# Patient Record
Sex: Female | Born: 1955
Health system: Southern US, Community
[De-identification: ages and names within clinical notes are randomized; demographics above are authoritative.]

## PROBLEM LIST (undated history)

## (undated) DIAGNOSIS — R251 Tremor, unspecified: Secondary | ICD-10-CM

## (undated) DIAGNOSIS — R0902 Hypoxemia: Secondary | ICD-10-CM

## (undated) DIAGNOSIS — J189 Pneumonia, unspecified organism: Secondary | ICD-10-CM

## (undated) DIAGNOSIS — R062 Wheezing: Secondary | ICD-10-CM

## (undated) DIAGNOSIS — R011 Cardiac murmur, unspecified: Secondary | ICD-10-CM

## (undated) DIAGNOSIS — F419 Anxiety disorder, unspecified: Secondary | ICD-10-CM

## (undated) DIAGNOSIS — J449 Chronic obstructive pulmonary disease, unspecified: Secondary | ICD-10-CM

## (undated) DIAGNOSIS — R0601 Orthopnea: Secondary | ICD-10-CM

## (undated) DIAGNOSIS — F32A Depression, unspecified: Secondary | ICD-10-CM

## (undated) DIAGNOSIS — E785 Hyperlipidemia, unspecified: Secondary | ICD-10-CM

## (undated) DIAGNOSIS — R51 Headache: Secondary | ICD-10-CM

## (undated) DIAGNOSIS — G4730 Sleep apnea, unspecified: Secondary | ICD-10-CM

## (undated) DIAGNOSIS — R519 Headache, unspecified: Secondary | ICD-10-CM

## (undated) DIAGNOSIS — R05 Cough: Secondary | ICD-10-CM

## (undated) DIAGNOSIS — R609 Edema, unspecified: Secondary | ICD-10-CM

## (undated) DIAGNOSIS — R059 Cough, unspecified: Secondary | ICD-10-CM

## (undated) DIAGNOSIS — I10 Essential (primary) hypertension: Secondary | ICD-10-CM

## (undated) DIAGNOSIS — R600 Localized edema: Secondary | ICD-10-CM

## (undated) DIAGNOSIS — J989 Respiratory disorder, unspecified: Secondary | ICD-10-CM

## (undated) DIAGNOSIS — K219 Gastro-esophageal reflux disease without esophagitis: Secondary | ICD-10-CM

## (undated) DIAGNOSIS — F329 Major depressive disorder, single episode, unspecified: Secondary | ICD-10-CM

## (undated) DIAGNOSIS — Z9109 Other allergy status, other than to drugs and biological substances: Secondary | ICD-10-CM

## (undated) DIAGNOSIS — J40 Bronchitis, not specified as acute or chronic: Secondary | ICD-10-CM

## (undated) DIAGNOSIS — Z720 Tobacco use: Secondary | ICD-10-CM

## (undated) HISTORY — DX: Depression, unspecified: F32.A

## (undated) HISTORY — DX: Essential (primary) hypertension: I10

## (undated) HISTORY — PX: EYE SURGERY: SHX253

## (undated) HISTORY — DX: Tobacco use: Z72.0

## (undated) HISTORY — DX: Chronic obstructive pulmonary disease, unspecified: J44.9

## (undated) HISTORY — PX: TUBAL LIGATION: SHX77

## (undated) HISTORY — DX: Anxiety disorder, unspecified: F41.9

## (undated) HISTORY — DX: Major depressive disorder, single episode, unspecified: F32.9

## (undated) HISTORY — DX: Hyperlipidemia, unspecified: E78.5

---

## 2005-05-03 HISTORY — PX: FRACTURE SURGERY: SHX138

## 2005-06-17 ENCOUNTER — Ambulatory Visit: Payer: Self-pay | Admitting: Otolaryngology

## 2007-08-17 ENCOUNTER — Ambulatory Visit: Payer: Self-pay | Admitting: Family Medicine

## 2007-08-28 ENCOUNTER — Ambulatory Visit: Payer: Self-pay | Admitting: Internal Medicine

## 2007-09-05 ENCOUNTER — Ambulatory Visit: Payer: Self-pay | Admitting: Internal Medicine

## 2010-04-07 ENCOUNTER — Ambulatory Visit: Payer: Self-pay | Admitting: Internal Medicine

## 2010-12-11 ENCOUNTER — Encounter: Payer: Self-pay | Admitting: Internal Medicine

## 2011-01-07 ENCOUNTER — Telehealth: Payer: Self-pay | Admitting: Internal Medicine

## 2011-01-07 MED ORDER — AZITHROMYCIN 500 MG PO TABS: 500.0000 mg | ORAL_TABLET | Freq: Every day | ORAL | Status: AC

## 2011-01-07 MED ORDER — HYDROCODONE-ACETAMINOPHEN 5-500 MG PO TABS: 1.0000 | ORAL_TABLET | Freq: Four times a day (QID) | ORAL | Status: DC | PRN

## 2011-01-07 NOTE — Telephone Encounter (Signed)
Yes, you can call her in azithromycin 500 mg one tablet daily for 7 days #7 no refills.  alsoc can call her in vicodin 5/500 one tablet every 6 hours as needed for cough  #40 no refills

## 2011-01-07 NOTE — Telephone Encounter (Signed)
Patient called and wanted to know if you could call her in an antibiotic for this weekend because she is in [place] because of her sister's death and then she stated she will make an appt with you next week.  She stated she is congested and has a terrible cough that is causing her to lose her voice, she does not report running a fever.   She stated she started a prednisone refill you had given her in the past but she only has one day left and it has not helped her, she has also been taking mucinex.  She just didn't want this to turn into pneumonia like it has in the past.  Please advise.

## 2011-01-07 NOTE — Telephone Encounter (Signed)
Addended by: Darletta Moll A on: 01/07/2011 05:28 PM   Modules accepted: Orders

## 2011-01-07 NOTE — Telephone Encounter (Signed)
Both Rxs have been called in

## 2011-01-13 ENCOUNTER — Ambulatory Visit (INDEPENDENT_AMBULATORY_CARE_PROVIDER_SITE_OTHER): Payer: BC Managed Care – PPO | Admitting: Internal Medicine

## 2011-01-13 ENCOUNTER — Encounter: Payer: Self-pay | Admitting: Internal Medicine

## 2011-01-13 VITALS — BP 128/92 | HR 81 | Temp 98.2°F | Resp 16 | Ht 65.0 in | Wt 141.5 lb

## 2011-01-13 DIAGNOSIS — Z720 Tobacco use: Secondary | ICD-10-CM

## 2011-01-13 DIAGNOSIS — J449 Chronic obstructive pulmonary disease, unspecified: Secondary | ICD-10-CM

## 2011-01-13 DIAGNOSIS — F411 Generalized anxiety disorder: Secondary | ICD-10-CM

## 2011-01-13 DIAGNOSIS — F17200 Nicotine dependence, unspecified, uncomplicated: Secondary | ICD-10-CM

## 2011-01-13 DIAGNOSIS — R05 Cough: Secondary | ICD-10-CM

## 2011-01-13 DIAGNOSIS — R059 Cough, unspecified: Secondary | ICD-10-CM

## 2011-01-13 DIAGNOSIS — F419 Anxiety disorder, unspecified: Secondary | ICD-10-CM

## 2011-01-13 MED ORDER — METHYLPREDNISOLONE ACETATE 40 MG/ML IJ SUSP
40.0000 mg | Freq: Once | INTRAMUSCULAR | Status: AC
  Administered 2011-01-13: 40 mg via INTRAMUSCULAR

## 2011-01-13 MED ORDER — ALPRAZOLAM 0.5 MG PO TABS: 0.5000 mg | ORAL_TABLET | Freq: Every day | ORAL | Status: DC | PRN

## 2011-01-16 ENCOUNTER — Encounter: Payer: Self-pay | Admitting: Internal Medicine

## 2011-01-16 DIAGNOSIS — F329 Major depressive disorder, single episode, unspecified: Secondary | ICD-10-CM | POA: Insufficient documentation

## 2011-01-16 DIAGNOSIS — J432 Centrilobular emphysema: Secondary | ICD-10-CM | POA: Insufficient documentation

## 2011-01-16 DIAGNOSIS — E785 Hyperlipidemia, unspecified: Secondary | ICD-10-CM | POA: Insufficient documentation

## 2011-01-16 DIAGNOSIS — Z87891 Personal history of nicotine dependence: Secondary | ICD-10-CM | POA: Insufficient documentation

## 2011-01-16 DIAGNOSIS — F32A Depression, unspecified: Secondary | ICD-10-CM | POA: Insufficient documentation

## 2011-01-16 DIAGNOSIS — F419 Anxiety disorder, unspecified: Secondary | ICD-10-CM | POA: Insufficient documentation

## 2011-01-16 NOTE — Assessment & Plan Note (Signed)
Reviewed her tobacco abuse and reinforced that chronic cough is not going to resolve with tobacco cessation.  5 minutes was spent in tobacco cessation counselling.

## 2011-01-16 NOTE — Assessment & Plan Note (Signed)
Her pulmonary exam was normal and her 02 sats were fine. Will rx vicodin for nighttime cough, and obtain CXR if no improvement in 2-3 weeks

## 2011-01-16 NOTE — Progress Notes (Signed)
  Subjective:    Patient ID: Diane [name], female    DOB: 1955-06-07, 55 y.o.   MRN: [id number]  HPI 55 yo woman with history of chronic depression and generalized anxiety , tobacco abuse and COPD presents with persistent cough and increasd anxiety following recnet deaths in her family.  She had a COPD exacerbation while attending her father in law's funeral in [place] earlier in the month and was treated empirically with antibiotics and prednisone along with her usual bronchodilator therapy.  Her cough has persisted but it is nonproductive.  Review of Systems  Constitutional: Positive for fatigue. Negative for appetite change.  HENT: Positive for postnasal drip. Negative for ear pain, sneezing, drooling, neck pain and sinus pressure.   Eyes: Negative for discharge and redness.  Respiratory: Positive for cough, chest tightness and shortness of breath.   Cardiovascular: Negative for chest pain, palpitations and leg swelling.  Gastrointestinal: Negative.   Genitourinary: Negative.   Musculoskeletal: Negative.   Skin: Negative.   Neurological: Positive for headaches.  Psychiatric/Behavioral: Positive for sleep disturbance, dysphoric mood and decreased concentration. The patient is nervous/anxious.        Objective:   Physical Exam  Constitutional: She is oriented to person, place, and time. She appears well-developed and well-nourished.  HENT:  Mouth/Throat: Oropharynx is clear and moist.  Eyes: EOM are normal. Pupils are equal, round, and reactive to light. No scleral icterus.  Neck: Normal range of motion. Neck supple. No JVD present. No thyromegaly present.  Cardiovascular: Normal rate, regular rhythm, normal heart sounds and intact distal pulses.   Pulmonary/Chest: Effort normal and breath sounds normal.  Abdominal: Soft. Bowel sounds are normal. She exhibits no mass. There is no tenderness.  Musculoskeletal: Normal range of motion. She exhibits no edema.  Lymphadenopathy:    She  has no cervical adenopathy.  Neurological: She is alert and oriented to person, place, and time.  Skin: Skin is warm and dry.  Psychiatric: Her speech is normal and behavior is normal. Judgment and thought content normal. Her mood appears anxious. Cognition and memory are normal. She exhibits a depressed mood.          Assessment & Plan:

## 2011-01-16 NOTE — Assessment & Plan Note (Addendum)
She is having a difficult time due to several recent deaths in her family and her inability to take off work to comfort her husband who lost his father to Cancer.  Trial of alprazolam to use short term for relief of anxiety and insomnia.  Increase dose of  celexa. Return in one month

## 2011-02-24 ENCOUNTER — Other Ambulatory Visit: Payer: Self-pay | Admitting: Internal Medicine

## 2011-04-13 ENCOUNTER — Telehealth: Payer: Self-pay | Admitting: Internal Medicine

## 2011-04-13 NOTE — Telephone Encounter (Signed)
[phone] home can leave message Pt called wanted to be seen  Patient has sore throat congestion in chest fever (on and off) cough up phlem that is green  [facility] [address]

## 2011-04-13 NOTE — Telephone Encounter (Signed)
1:15 Tomorrow  Please put her in

## 2011-04-13 NOTE — Telephone Encounter (Signed)
Patient has been scheduled for tomorrow.  She is aware of the appt.

## 2011-04-13 NOTE — Telephone Encounter (Signed)
I called patient she stated she has a productive cough, sometimes she has problems breathing, body aches, and sore throat.  She stated this started over the weekend and she thinks she needs prednisone.  Please advise.

## 2011-04-14 ENCOUNTER — Ambulatory Visit (INDEPENDENT_AMBULATORY_CARE_PROVIDER_SITE_OTHER): Payer: BC Managed Care – PPO | Admitting: Internal Medicine

## 2011-04-14 ENCOUNTER — Encounter: Payer: Self-pay | Admitting: Internal Medicine

## 2011-04-14 DIAGNOSIS — J441 Chronic obstructive pulmonary disease with (acute) exacerbation: Secondary | ICD-10-CM | POA: Insufficient documentation

## 2011-04-14 MED ORDER — NICOTINE 7 MG/24HR TD PT24: 1.0000 | MEDICATED_PATCH | TRANSDERMAL | Status: AC

## 2011-04-14 MED ORDER — HYDROCOD POLST-CHLORPHEN POLST 10-8 MG/5ML PO LQCR: 10.0000 mL | Freq: Every evening | ORAL | Status: DC | PRN

## 2011-04-14 MED ORDER — DOXYCYCLINE HYCLATE 100 MG PO TABS: 100.0000 mg | ORAL_TABLET | Freq: Two times a day (BID) | ORAL | Status: AC

## 2011-04-14 MED ORDER — PREDNISONE (PAK) 10 MG PO TABS: ORAL_TABLET | ORAL | Status: AC

## 2011-04-14 NOTE — Progress Notes (Signed)
Subjective:    Patient ID: Diane [name], female    DOB: [birth year], 55 y.o.   MRN: [id number]  HPI  Diane [name] is a 55 yo smoker who presents with 1.5 wk of productive cough,  subjective fevers and chills.  She denies severe myalgias and any sick contacts. She has had her influenza vaccine.  She denies chest pain bur does report dyspnea and wheezing.  Has been using Spiriva and Symbicort and her rescue inhaler three times daily.  Past Medical History  Diagnosis Date  . COPD (chronic obstructive pulmonary disease)   . Hyperlipidemia   . Tobacco abuse   . Depression   . Anxiety    Current Outpatient Prescriptions on File Prior to Visit  Medication Sig Dispense Refill  . albuterol (VENTOLIN HFA) 108 (90 BASE) MCG/ACT inhaler Inhale 2 puffs into the lungs 4 (four) times daily as needed.        . ALPRAZolam (XANAX) 0.5 MG tablet Take 1 tablet (0.5 mg total) by mouth daily as needed for sleep or anxiety.  30 tablet  5  . budesonide-formoterol (SYMBICORT) 160-4.5 MCG/ACT inhaler Inhale 2 puffs into the lungs 2 (two) times daily.        . citalopram (CELEXA) 20 MG tablet Take 20 mg by mouth daily.        . eszopiclone (LUNESTA) 2 MG TABS Take 2 mg by mouth at bedtime. Take immediately before bedtime       . hydrochlorothiazide 25 MG tablet Take 25 mg by mouth daily.        . Red Yeast Rice 600 MG CAPS Take by mouth daily.        Marland Kitchen tiotropium (SPIRIVA HANDIHALER) 18 MCG inhalation capsule Place 1 capsule (18 mcg total) into inhaler and inhale daily.  30 each  6     Review of Systems  Constitutional: Positive for fever and chills. Negative for unexpected weight change.  HENT: Positive for congestion, rhinorrhea and sinus pressure. Negative for hearing loss, ear pain, nosebleeds, sore throat, facial swelling, sneezing, mouth sores, trouble swallowing, neck pain, neck stiffness, voice change, postnasal drip, tinnitus and ear discharge.   Eyes: Negative for pain, discharge, redness and visual  disturbance.  Respiratory: Positive for cough, shortness of breath and wheezing. Negative for chest tightness and stridor.   Cardiovascular: Negative for chest pain, palpitations and leg swelling.  Musculoskeletal: Negative for myalgias and arthralgias.  Skin: Negative for color change and rash.  Neurological: Positive for headaches. Negative for dizziness, weakness and light-headedness.  Hematological: Negative for adenopathy.       Objective:   Physical Exam  Constitutional: She is oriented to person, place, and time. She appears well-developed and well-nourished.  HENT:  Mouth/Throat: Oropharynx is clear and moist.  Eyes: EOM are normal. Pupils are equal, round, and reactive to light. No scleral icterus.  Neck: Normal range of motion. Neck supple. No JVD present. No thyromegaly present.  Cardiovascular: Normal rate, regular rhythm, normal heart sounds and intact distal pulses.   Pulmonary/Chest: Effort normal. She has wheezes.  Abdominal: Soft. Bowel sounds are normal. She exhibits no mass. There is no tenderness.  Musculoskeletal: Normal range of motion. She exhibits no edema.  Lymphadenopathy:    She has no cervical adenopathy.  Neurological: She is alert and oriented to person, place, and time.  Skin: Skin is warm and dry.  Psychiatric: She has a normal mood and affect.          Assessment & Plan:  COPD exacerbation Precipitated by viral illness .  Will treat with doxycline, steroid taper and cough suppressants.    Updated Medication List Outpatient Encounter Prescriptions as of 04/14/2011  Medication Sig Dispense Refill  . albuterol (VENTOLIN HFA) 108 (90 BASE) MCG/ACT inhaler Inhale 2 puffs into the lungs 4 (four) times daily as needed.        . ALPRAZolam (XANAX) 0.5 MG tablet Take 1 tablet (0.5 mg total) by mouth daily as needed for sleep or anxiety.  30 tablet  5  . budesonide-formoterol (SYMBICORT) 160-4.5 MCG/ACT inhaler Inhale 2 puffs into the lungs 2 (two)  times daily.        . citalopram (CELEXA) 20 MG tablet Take 20 mg by mouth daily.        . eszopiclone (LUNESTA) 2 MG TABS Take 2 mg by mouth at bedtime. Take immediately before bedtime       . hydrochlorothiazide 25 MG tablet Take 25 mg by mouth daily.        . Red Yeast Rice 600 MG CAPS Take by mouth daily.        Marland Kitchen tiotropium (SPIRIVA HANDIHALER) 18 MCG inhalation capsule Place 1 capsule (18 mcg total) into inhaler and inhale daily.  30 each  6  . chlorpheniramine-HYDROcodone (TUSSIONEX PENNKINETIC ER) 10-8 MG/5ML LQCR Take 10 mLs by mouth at bedtime as needed.  150 mL  0  . doxycycline (VIBRA-TABS) 100 MG tablet Take 1 tablet (100 mg total) by mouth 2 (two) times daily.  14 tablet  0  . nicotine (NICODERM CQ - DOSED IN MG/24 HR) 7 mg/24hr patch Place 1 patch onto the skin daily.  30 patch  0  . predniSONE (STERAPRED UNI-PAK) 10 MG tablet 6 tablets on day 1,  Decrease by 1 tablet daily until gone.  21 tablet  0  . DISCONTD: HYDROcodone-acetaminophen (VICODIN) 5-500 MG per tablet Take 1 tablet by mouth every 6 (six) hours as needed (cough).  40 tablet  0

## 2011-04-14 NOTE — Assessment & Plan Note (Signed)
Precipitated by viral illness .  Will treat with doxycline, steroid taper and cough suppressants.

## 2011-04-15 ENCOUNTER — Telehealth: Payer: Self-pay | Admitting: Internal Medicine

## 2011-04-15 NOTE — Telephone Encounter (Signed)
this is why i sked her yesterday if she needed one.a dn she said no.  I am out of the office this afternoon m it will have to wati utnil tomrrow unless you can generate one

## 2011-04-15 NOTE — Telephone Encounter (Signed)
[phone]  Pt called stated she was in to see dr Darrick Huntsman yesterday and she stay home again today and wanted to know if she could get a note for yesterday and today. Please advise pt

## 2011-04-15 NOTE — Telephone Encounter (Signed)
Patient called back, she says that she needs this note to be for yesterday and today because she called in today. She wants this faxed to her work. [phone]

## 2011-06-18 ENCOUNTER — Ambulatory Visit: Payer: Self-pay | Admitting: Internal Medicine

## 2011-06-21 ENCOUNTER — Telehealth: Payer: Self-pay | Admitting: Internal Medicine

## 2011-06-21 NOTE — Telephone Encounter (Signed)
You can call her in tessalon 200 mg every 8 hours prn cough  #30 if she needs something

## 2011-06-21 NOTE — Telephone Encounter (Signed)
[facility] Triage Call Report Triage Record Num: [id number] Operator: Lesli Albee Patient Name: Diane [name] Call Date & Time: 06/21/2011 11:02:07AM Patient Phone: [phone] PCP: Patient Gender: Female PCP Fax : Patient DOB: 1955/09/28 Practice Name: [facility] Reason for Call: Caller: Diane [name]; PCP: Duncan Dull; CB#: [phone]; ; ; Call regarding Cough/Congestion; Pt is calling to say that she has been to the [facility] on Friday. Pt was dx with bronchitis. Pt had a CXR which was negative for pneumonia. She was prescribed Doxycycline 2 capsules BID . Pt started it Friday evening. She is calling to say that even though her fever has stopped and she has stopped wheezing with tx; she continues a dry cough. Calling for advice about that. Pt takes Spireva QD; Symbacort BID; and she has Albuterol as a rescue inhaler that she has only used 2x. Pt is not wheezing or in any resp distress. RN advised she begin to use her Albuterol q4h and advised other home care measures for her cough. Pt will call back if no improvement. Protocol(s) Used: Upper Respiratory Infection (URI) Recommended Outcome per Protocol: Provide Home/Self Care Reason for Outcome: All other situations Care Advice: ~ Use a cool mist humidifier to moisten air. Be sure to clean according to manufacturer's instructions. ~ Consider use of a saline nasal spray per package directions to help relieve nasal congestion. ~ HEALTH PROMOTION / MAINTENANCE ~ SYMPTOM / CONDITION MANAGEMENT ~ INFECTION CONTROL Consider nonprescription decongestant (Sudafed, Drixoral) for relief of symptoms after checking with a provider, especially if there is a history of hypertension, hyperthyroidism, heart disease, diabetes, glaucoma, urinary retention caused by prostatic hypertrophy. ~ During pregnancy or when breastfeeding, do not take nonprescription, complementary/alternative medication(s) without the approval of  provider ~ Most adults need to drink 6-10 eight-ounce glasses (1.2-2.0 liters) of fluids per day unless previously told to limit fluid intake for other medical reasons. Limit fluids that contain caffeine, sugar or alcohol. Urine will be a very light yellow color when you drink enough fluids. ~ Analgesic/Antipyretic Advice - Acetaminophen: Consider acetaminophen as directed on label or by pharmacist/provider for pain or fever PRECAUTIONS: - Use if there is no history of liver disease, alcoholism, or intake of three or more alcohol drinks per day - Only if approved by provider during pregnancy or when breastfeeding - During pregnancy, acetaminophen should not be taken more than 3 consecutive days without telling provider - Do not exceed recommended dose or frequency ~ Analgesic/Antipyretic Advice - NSAIDs: Consider aspirin, ibuprofen, naproxen or ketoprofen for pain or fever as directed on label or by pharmacist/provider. PRECAUTIONS: - If over 63 years of age, should not take longer than 1 week without consulting provider. EXCEPTIONS: - Should not be used if taking blood thinners or have bleeding problems. - Do not use if have history of sensitivity/allergy to any of these medications; or history of cardiovascular, ulcer, ~ 06/21/2011 11:12:27AM Page 1 of 2 [id number] [facility] Triage Call Report Patient Name: Diane [name] continuation page/s kidney, liver disease or diabetes unless approved by provider. - Do not exceed recommended dose or frequency. Respiratory Hygiene: - Cover the nose/mouth tightly with a tissue when coughing or sneezing. - Use tissue 1 time and discard in the nearest waste receptacle. - Wash hands with soap and water or alcohol-based hand rub after coming into contact with respiratory secretions and contaminated objects/materials. - Alternatively when no tissue is available, cough into the bend of the elbow. .Avoid touching your eyes, nose or  mouth. ~ 06/21/2011 11:12:27AM  Page 2 of 2 [email or web address]

## 2011-06-22 MED ORDER — BENZONATATE 200 MG PO CAPS: ORAL_CAPSULE | ORAL | Status: DC

## 2011-06-22 NOTE — Telephone Encounter (Signed)
Left message on machine asking pt to call back. 

## 2011-06-22 NOTE — Telephone Encounter (Signed)
Advised pt, she does want the tessalon, tessalon sent to pharmacy.

## 2011-08-12 ENCOUNTER — Ambulatory Visit (INDEPENDENT_AMBULATORY_CARE_PROVIDER_SITE_OTHER): Payer: BC Managed Care – PPO | Admitting: Internal Medicine

## 2011-08-12 ENCOUNTER — Encounter: Payer: Self-pay | Admitting: Internal Medicine

## 2011-08-12 ENCOUNTER — Other Ambulatory Visit (HOSPITAL_COMMUNITY)
Admission: RE | Admit: 2011-08-12 | Discharge: 2011-08-12 | Disposition: A | Discharge status: Home / Self Care | Payer: BC Managed Care – PPO | Source: Ambulatory Visit | Attending: Internal Medicine | Admitting: Internal Medicine

## 2011-08-12 VITALS — BP 152/80 | HR 70 | Temp 98.5°F | Resp 18 | Wt 142.8 lb

## 2011-08-12 DIAGNOSIS — R5381 Other malaise: Secondary | ICD-10-CM

## 2011-08-12 DIAGNOSIS — R5383 Other fatigue: Secondary | ICD-10-CM

## 2011-08-12 DIAGNOSIS — Z01419 Encounter for gynecological examination (general) (routine) without abnormal findings: Secondary | ICD-10-CM | POA: Insufficient documentation

## 2011-08-12 DIAGNOSIS — F17200 Nicotine dependence, unspecified, uncomplicated: Secondary | ICD-10-CM

## 2011-08-12 DIAGNOSIS — Z0000 Encounter for general adult medical examination without abnormal findings: Secondary | ICD-10-CM

## 2011-08-12 DIAGNOSIS — Z720 Tobacco use: Secondary | ICD-10-CM

## 2011-08-12 DIAGNOSIS — J4489 Other specified chronic obstructive pulmonary disease: Secondary | ICD-10-CM

## 2011-08-12 DIAGNOSIS — T148XXA Other injury of unspecified body region, initial encounter: Secondary | ICD-10-CM

## 2011-08-12 DIAGNOSIS — Z1159 Encounter for screening for other viral diseases: Secondary | ICD-10-CM | POA: Insufficient documentation

## 2011-08-12 DIAGNOSIS — J449 Chronic obstructive pulmonary disease, unspecified: Secondary | ICD-10-CM

## 2011-08-12 DIAGNOSIS — I10 Essential (primary) hypertension: Secondary | ICD-10-CM

## 2011-08-12 DIAGNOSIS — Z1239 Encounter for other screening for malignant neoplasm of breast: Secondary | ICD-10-CM

## 2011-08-12 DIAGNOSIS — Z124 Encounter for screening for malignant neoplasm of cervix: Secondary | ICD-10-CM

## 2011-08-12 DIAGNOSIS — E785 Hyperlipidemia, unspecified: Secondary | ICD-10-CM

## 2011-08-12 MED ORDER — CITALOPRAM HYDROBROMIDE 20 MG PO TABS: 20.0000 mg | ORAL_TABLET | Freq: Every day | ORAL | Status: DC
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
## 2011-08-13 ENCOUNTER — Other Ambulatory Visit (INDEPENDENT_AMBULATORY_CARE_PROVIDER_SITE_OTHER): Payer: BC Managed Care – PPO | Admitting: *Deleted

## 2011-08-13 DIAGNOSIS — R5381 Other malaise: Secondary | ICD-10-CM

## 2011-08-13 DIAGNOSIS — E785 Hyperlipidemia, unspecified: Secondary | ICD-10-CM

## 2011-08-13 DIAGNOSIS — R5383 Other fatigue: Secondary | ICD-10-CM

## 2011-08-13 DIAGNOSIS — T148XXA Other injury of unspecified body region, initial encounter: Secondary | ICD-10-CM

## 2011-08-13 LAB — CBC WITH DIFFERENTIAL/PLATELET
Basophils Absolute: 0 10*3/uL (ref 0.0–0.1)
Basophils Relative: 0.2 % (ref 0.0–3.0)
Eosinophils Absolute: 0.1 10*3/uL (ref 0.0–0.7)
Lymphocytes Relative: 30.3 % (ref 12.0–46.0)
MCHC: 33.5 g/dL (ref 30.0–36.0)
Monocytes Relative: 9.3 % (ref 3.0–12.0)
Neutrophils Relative %: 57.5 % (ref 43.0–77.0)
RBC: 4.53 Mil/uL (ref 3.87–5.11)
RDW: 13.5 % (ref 11.5–14.6)

## 2011-08-13 LAB — TSH: TSH: 0.91 u[IU]/mL (ref 0.35–5.50)

## 2011-08-13 LAB — LIPID PANEL
Cholesterol: 217 mg/dL — ABNORMAL HIGH (ref 0–200)
Total CHOL/HDL Ratio: 4
Triglycerides: 73 mg/dL (ref 0.0–149.0)

## 2011-08-14 LAB — COMPLETE METABOLIC PANEL WITH GFR
ALT: 16 U/L (ref 0–35)
CO2: 28 mEq/L (ref 19–32)
Calcium: 9.5 mg/dL (ref 8.4–10.5)
Chloride: 103 mEq/L (ref 96–112)
Creat: 0.81 mg/dL (ref 0.50–1.10)
GFR, Est African American: >89 mL/min
GFR, Est Non African American: 83 mL/min
Glucose, Bld: 80 mg/dL (ref 70–99)
Total Bilirubin: 0.5 mg/dL (ref 0.3–1.2)

## 2011-08-15 ENCOUNTER — Encounter: Payer: Self-pay | Admitting: Internal Medicine

## 2011-08-15 DIAGNOSIS — I10 Essential (primary) hypertension: Secondary | ICD-10-CM | POA: Insufficient documentation

## 2011-08-15 DIAGNOSIS — Z0000 Encounter for general adult medical examination without abnormal findings: Secondary | ICD-10-CM | POA: Insufficient documentation

## 2011-08-15 NOTE — Assessment & Plan Note (Signed)
New onset with prior elevated diastolic readings.. We will start hydrochlorothiazide 25 mg one tablet daily.

## 2011-08-15 NOTE — Assessment & Plan Note (Signed)
Breast exam and pelvic exam and Pap smear were done today. She was referred for mammogram. She is up-to-date on colonoscopy.

## 2011-08-15 NOTE — Assessment & Plan Note (Signed)
Spent 3 minutes discussing risk of continued tobacco abuse, including but not limited to CAD, PAD, hypertension, and CA.  He is not interested in pharmacotherapy at this time. 

## 2011-08-15 NOTE — Assessment & Plan Note (Addendum)
Mild with LDL of 138, normal HDL and triglycerides.. cardiac risk factors include tobacco abuse and postmenopausal status, with new-onset hypertension suggested by today's the diastolic reading. We will repeat in 6 months and encourage LDL of less than 100 for modification of risk factors as well as tobacco cessation.

## 2011-08-15 NOTE — Assessment & Plan Note (Signed)
She is currently asymptomatic. Her COPD is severe chronic tobacco use.

## 2011-08-16 ENCOUNTER — Telehealth: Payer: Self-pay | Admitting: Internal Medicine

## 2011-08-16 DIAGNOSIS — R928 Other abnormal and inconclusive findings on diagnostic imaging of breast: Secondary | ICD-10-CM

## 2011-08-16 LAB — HM MAMMOGRAPHY: HM Mammogram: NORMAL

## 2011-08-16 NOTE — Telephone Encounter (Signed)
Correction: both breasts need mag  Compression views.  Order printed

## 2011-08-16 NOTE — Telephone Encounter (Signed)
See other message re labs,  But mammogram was abnormal and they need additional images of right breast.  Have they contacted her?

## 2011-08-16 NOTE — Telephone Encounter (Signed)
Patient notified.  [facility] has not contacted her yet.

## 2011-08-17 NOTE — Telephone Encounter (Signed)
Patient has an appt tomorrow at 9:30 at [facility].

## 2011-08-19 ENCOUNTER — Encounter: Payer: Self-pay | Admitting: Internal Medicine

## 2011-08-19 ENCOUNTER — Telehealth: Payer: Self-pay | Admitting: Internal Medicine

## 2011-08-19 NOTE — Telephone Encounter (Signed)
Additional views of both breast were normal.  No masses

## 2011-08-19 NOTE — Telephone Encounter (Signed)
Patient notified

## 2011-08-24 ENCOUNTER — Other Ambulatory Visit: Payer: Self-pay | Admitting: Internal Medicine

## 2011-08-26 ENCOUNTER — Encounter: Payer: Self-pay | Admitting: Internal Medicine

## 2011-11-08 ENCOUNTER — Other Ambulatory Visit: Payer: Self-pay | Admitting: Internal Medicine

## 2011-11-28 ENCOUNTER — Other Ambulatory Visit: Payer: Self-pay | Admitting: Internal Medicine

## 2011-12-15 ENCOUNTER — Other Ambulatory Visit: Payer: Self-pay | Admitting: Internal Medicine

## 2011-12-28 ENCOUNTER — Encounter: Payer: Self-pay | Admitting: Internal Medicine

## 2011-12-28 ENCOUNTER — Ambulatory Visit (INDEPENDENT_AMBULATORY_CARE_PROVIDER_SITE_OTHER): Payer: BC Managed Care – PPO | Admitting: Internal Medicine

## 2011-12-28 VITALS — BP 150/90 | HR 90 | Temp 98.5°F | Resp 18 | Wt 143.8 lb

## 2011-12-28 DIAGNOSIS — J441 Chronic obstructive pulmonary disease with (acute) exacerbation: Secondary | ICD-10-CM

## 2011-12-28 MED ORDER — HYDROCODONE-ACETAMINOPHEN 5-500 MG PO TABS: ORAL_TABLET | ORAL | Status: DC

## 2011-12-28 MED ORDER — PREDNISONE (PAK) 10 MG PO TABS: ORAL_TABLET | ORAL | Status: AC

## 2011-12-28 MED ORDER — AZITHROMYCIN 500 MG PO TABS: ORAL_TABLET | ORAL | Status: AC
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
## 2011-12-28 NOTE — Patient Instructions (Addendum)
You are having a COPD exacerbation   .  I am prescribing an antibiotic (azithromycin) and prednisone taper  To manage the infection and the inflammation in your ear/sinuses.   I also advise use of the following OTC meds to help with your upper respiratory symptoms.   Take generic OTC benadryl 25 mg every 8 hours for the drainage,  Sudafed PE  10 to 30 mg every 8 hours for the congestion, you may substitute Afrin nasal spray for the nighttime dose of sudafed PE  If needed to prevent insomnia.  flushes your sinuses twice daily with Simply Saline (do over the sink because if you do it right you will spit out globs of mucus)  Use the vicodin  As needed for cough   Gargle with salt water as needed for sore throat.

## 2011-12-28 NOTE — Assessment & Plan Note (Addendum)
Mild, due to prolonged URI symptoms.  Likely viral but given  chronicity and pleuritic pain will [place] azithromycin , prednisone and vicodin

## 2012-02-02 ENCOUNTER — Other Ambulatory Visit: Payer: Self-pay | Admitting: Internal Medicine

## 2012-02-22 ENCOUNTER — Telehealth: Payer: Self-pay | Admitting: Internal Medicine

## 2012-02-22 NOTE — Telephone Encounter (Signed)
Patient notified as directed

## 2012-02-22 NOTE — Telephone Encounter (Signed)
Caller: Linh/Patient; Patient Name: Diane [name]; PCP: Duncan Dull (Adults only); Best Callback Phone Number: [phone] Onset- Fri.02/18/12 pt developed headache, and 02/19/12 a  productive cough with green-brown phlegm and  rattling in the chest.  She rates the headache at  a 6 on pain scale, states she has a hx of Migraines. Emergent s/s of Headache and Cough protocol r/o. Pt to see provider within 24hrs. No appointments seen for tomorrow.  Offered pt to see if PA or NP at another office has an appointment, but pt declines due to drive. Message sent. Pt is wanting  a late afternoon appointment due to work. Please call.

## 2012-02-22 NOTE — Telephone Encounter (Signed)
Why is thisPlease offer pat appt on Friday if available , if she can't wait she will have to go to urgent care if she think sshe needs abx  Lavage your sinuses twice daly with Simply saline nasal spray.  Use benadryl 25 mg every 8 hours and Sudafed PE 10 to 30 every 8 hours to manage the drainage and congestion.  Gargle with salt water often for the sore throat. OTC Delsym for cough

## 2012-02-23 ENCOUNTER — Ambulatory Visit: Payer: Self-pay | Admitting: Family Medicine

## 2012-03-14 ENCOUNTER — Ambulatory Visit (INDEPENDENT_AMBULATORY_CARE_PROVIDER_SITE_OTHER): Payer: BC Managed Care – PPO | Admitting: Internal Medicine

## 2012-03-14 ENCOUNTER — Encounter: Payer: Self-pay | Admitting: Internal Medicine

## 2012-03-14 VITALS — BP 118/64 | HR 87 | Temp 98.6°F | Ht 65.0 in | Wt 144.0 lb

## 2012-03-14 DIAGNOSIS — J441 Chronic obstructive pulmonary disease with (acute) exacerbation: Secondary | ICD-10-CM

## 2012-03-14 DIAGNOSIS — R062 Wheezing: Secondary | ICD-10-CM

## 2012-03-14 DIAGNOSIS — Z720 Tobacco use: Secondary | ICD-10-CM

## 2012-03-14 DIAGNOSIS — F17200 Nicotine dependence, unspecified, uncomplicated: Secondary | ICD-10-CM

## 2012-03-14 MED ORDER — BUPROPION HCL 75 MG PO TABS: 75.0000 mg | ORAL_TABLET | Freq: Two times a day (BID) | ORAL | Status: DC

## 2012-03-14 MED ORDER — METHYLPREDNISOLONE ACETATE 40 MG/ML IJ SUSP
40.0000 mg | Freq: Once | INTRAMUSCULAR | Status: AC
  Administered 2012-03-14: 40 mg via INTRAMUSCULAR

## 2012-03-14 MED ORDER — GUAIFENESIN-CODEINE 100-10 MG/5ML PO SYRP: 10.0000 mL | ORAL_SOLUTION | Freq: Three times a day (TID) | ORAL | Status: DC | PRN

## 2012-03-14 MED ORDER — PREDNISONE (PAK) 10 MG PO TABS: ORAL_TABLET | ORAL | Status: DC

## 2012-03-14 MED ORDER — AZITHROMYCIN 500 MG PO TABS: 500.0000 mg | ORAL_TABLET | Freq: Every day | ORAL | Status: DC
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
## 2012-03-14 NOTE — Patient Instructions (Addendum)
Start the wellbutrin after you have finished the prednisone and cough syrup.  You may want to take the second dose by 1 PM to avoid nighttime insomnia

## 2012-03-17 NOTE — Assessment & Plan Note (Signed)
Patient requires use of steroids and antibiotics for her COPD exacerbation with purulent sputum.

## 2012-03-17 NOTE — Assessment & Plan Note (Signed)
Counseling given. Patient has set a quit date. She does not want to use Chantix due to adverse reactions reported by [facility]. We discussed a trial of Wellbutrin to help with cravings. She is agreeable to that. Risks of adverse reactions described and she is willing to proceed.

## 2012-04-05 ENCOUNTER — Ambulatory Visit (INDEPENDENT_AMBULATORY_CARE_PROVIDER_SITE_OTHER)
Admission: RE | Admit: 2012-04-05 | Discharge: 2012-04-05 | Disposition: A | Discharge status: Home / Self Care | Payer: BC Managed Care – PPO | Source: Ambulatory Visit | Attending: Internal Medicine | Admitting: Internal Medicine

## 2012-04-05 ENCOUNTER — Telehealth: Payer: Self-pay | Admitting: Internal Medicine

## 2012-04-05 DIAGNOSIS — R091 Pleurisy: Secondary | ICD-10-CM

## 2012-04-05 NOTE — Telephone Encounter (Signed)
Could be pneumonia, cracked rib, or pulled muscle. Cannot really decide what to do without seeing her, so please ask  her to get a chest x ray today at [facility] if possible . I will place order in EPIc.  If she cant' go there before 5:00 we will have to send an order to [facility] for her to go there.

## 2012-04-05 NOTE — Telephone Encounter (Signed)
Spoke with pt and she wants to go to [facility] location for the x-ray. I told her how to get there and she will go before 5 pm. I told her to call back if she has any trouble and ask for me.

## 2012-04-05 NOTE — Telephone Encounter (Signed)
Pt was recently seen for Bronchitis and COPD now when she coughs she is having rib pain, under her right breast. Pt is having pain when she breath in her rib cage and was wanting to know what she should do. She was recently seen at an urgent care but still has the cough. She not sure if she pulled a muscle while coughing.  Cell Number [phone]

## 2012-04-06 ENCOUNTER — Other Ambulatory Visit: Payer: Self-pay

## 2012-04-06 MED ORDER — HYDROCOD POLST-CHLORPHEN POLST 10-8 MG/5ML PO LQCR: 5.0000 mL | Freq: Two times a day (BID) | ORAL | Status: DC | PRN

## 2012-06-01 ENCOUNTER — Other Ambulatory Visit: Payer: Self-pay | Admitting: Internal Medicine

## 2012-06-01 NOTE — Telephone Encounter (Signed)
Ok to fill 

## 2012-06-12 ENCOUNTER — Inpatient Hospital Stay: Payer: Self-pay | Admitting: Family Medicine

## 2012-06-12 LAB — COMPREHENSIVE METABOLIC PANEL
Anion Gap: 10 (ref 7–16)
Bilirubin,Total: 0.6 mg/dL (ref 0.2–1.0)
Calcium, Total: 9.3 mg/dL (ref 8.5–10.1)
Chloride: 93 mmol/L — ABNORMAL LOW (ref 98–107)
Creatinine: 0.64 mg/dL (ref 0.60–1.30)
EGFR (Non-African Amer.): >60
Glucose: 125 mg/dL — ABNORMAL HIGH (ref 65–99)
Osmolality: 255 (ref 275–301)
Potassium: 3.2 mmol/L — ABNORMAL LOW (ref 3.5–5.1)
SGOT(AST): 21 U/L (ref 15–37)
SGPT (ALT): 25 U/L (ref 12–78)

## 2012-06-12 LAB — CBC WITH DIFFERENTIAL/PLATELET
Basophil #: 0 10*3/uL (ref 0.0–0.1)
Basophil %: 0.4 %
Eosinophil #: 0.3 10*3/uL (ref 0.0–0.7)
Eosinophil %: 3.6 %
HCT: 42.8 % (ref 35.0–47.0)
HGB: 14.4 g/dL (ref 12.0–16.0)
Lymphocyte %: 37.6 %
MCV: 91 fL (ref 80–100)
Monocyte %: 10.2 %
Neutrophil %: 48.2 %
Platelet: 290 10*3/uL (ref 150–440)
WBC: 8.8 10*3/uL (ref 3.6–11.0)

## 2012-06-12 LAB — PRO B NATRIURETIC PEPTIDE: B-Type Natriuretic Peptide: 63 pg/mL (ref 0–125)

## 2012-06-13 LAB — BASIC METABOLIC PANEL
Anion Gap: 9 (ref 7–16)
BUN: 7 mg/dL (ref 7–18)
Calcium, Total: 9.3 mg/dL (ref 8.5–10.1)
Co2: 26 mmol/L (ref 21–32)
Creatinine: 0.95 mg/dL (ref 0.60–1.30)
EGFR (African American): >60
EGFR (Non-African Amer.): >60
Glucose: 217 mg/dL — ABNORMAL HIGH (ref 65–99)
Osmolality: 263 (ref 275–301)
Sodium: 129 mmol/L — ABNORMAL LOW (ref 136–145)

## 2012-06-13 LAB — CBC WITH DIFFERENTIAL/PLATELET
Basophil #: 0 10*3/uL (ref 0.0–0.1)
Basophil %: 0.1 %
Eosinophil #: 0 10*3/uL (ref 0.0–0.7)
Eosinophil %: 0 %
HGB: 15.2 g/dL (ref 12.0–16.0)
Lymphocyte #: 0.4 10*3/uL — ABNORMAL LOW (ref 1.0–3.6)
Lymphocyte %: 8.9 %
MCH: 31.9 pg (ref 26.0–34.0)
MCV: 93 fL (ref 80–100)
Neutrophil %: 89.8 %
RBC: 4.77 10*6/uL (ref 3.80–5.20)
WBC: 4.4 10*3/uL (ref 3.6–11.0)

## 2012-06-14 LAB — BASIC METABOLIC PANEL
BUN: 10 mg/dL (ref 7–18)
Calcium, Total: 9.5 mg/dL (ref 8.5–10.1)
Chloride: 100 mmol/L (ref 98–107)
Co2: 28 mmol/L (ref 21–32)
Creatinine: 0.75 mg/dL (ref 0.60–1.30)
EGFR (African American): >60
Potassium: 3.8 mmol/L (ref 3.5–5.1)

## 2012-06-16 LAB — BASIC METABOLIC PANEL
Anion Gap: 9 (ref 7–16)
Calcium, Total: 9.5 mg/dL (ref 8.5–10.1)
Chloride: 96 mmol/L — ABNORMAL LOW (ref 98–107)
Co2: 29 mmol/L (ref 21–32)
Creatinine: 0.79 mg/dL (ref 0.60–1.30)
EGFR (African American): >60
EGFR (Non-African Amer.): >60

## 2012-06-18 LAB — BASIC METABOLIC PANEL
Anion Gap: 7 (ref 7–16)
EGFR (African American): >60
Glucose: 113 mg/dL — ABNORMAL HIGH (ref 65–99)
Sodium: 135 mmol/L — ABNORMAL LOW (ref 136–145)

## 2012-06-18 LAB — CBC WITH DIFFERENTIAL/PLATELET
Basophil %: 0.1 %
Eosinophil %: 0 %
HGB: 13.6 g/dL (ref 12.0–16.0)
Lymphocyte %: 3.5 %
MCHC: 33.7 g/dL (ref 32.0–36.0)
MCV: 94 fL (ref 80–100)
Neutrophil #: 10.3 10*3/uL — ABNORMAL HIGH (ref 1.4–6.5)
Neutrophil %: 91.9 %
Platelet: 281 10*3/uL (ref 150–440)
RBC: 4.33 10*6/uL (ref 3.80–5.20)
RDW: 12.8 % (ref 11.5–14.5)

## 2012-06-28 ENCOUNTER — Encounter: Payer: Self-pay | Admitting: Internal Medicine

## 2012-06-28 ENCOUNTER — Ambulatory Visit (INDEPENDENT_AMBULATORY_CARE_PROVIDER_SITE_OTHER): Payer: Managed Care, Other (non HMO) | Admitting: Internal Medicine

## 2012-06-28 VITALS — BP 92/60 | HR 87 | Temp 98.3°F | Resp 12 | Wt 135.0 lb

## 2012-06-28 DIAGNOSIS — E871 Hypo-osmolality and hyponatremia: Secondary | ICD-10-CM

## 2012-06-28 DIAGNOSIS — J9620 Acute and chronic respiratory failure, unspecified whether with hypoxia or hypercapnia: Secondary | ICD-10-CM

## 2012-06-28 DIAGNOSIS — Z720 Tobacco use: Secondary | ICD-10-CM

## 2012-06-28 DIAGNOSIS — I10 Essential (primary) hypertension: Secondary | ICD-10-CM

## 2012-06-28 DIAGNOSIS — Z8709 Personal history of other diseases of the respiratory system: Secondary | ICD-10-CM

## 2012-06-28 DIAGNOSIS — Z23 Encounter for immunization: Secondary | ICD-10-CM

## 2012-06-28 DIAGNOSIS — J449 Chronic obstructive pulmonary disease, unspecified: Secondary | ICD-10-CM

## 2012-06-28 DIAGNOSIS — F17200 Nicotine dependence, unspecified, uncomplicated: Secondary | ICD-10-CM

## 2012-06-28 LAB — COMPREHENSIVE METABOLIC PANEL
ALT: 21 U/L (ref 0–35)
AST: 14 U/L (ref 0–37)
Albumin: 3.4 g/dL — ABNORMAL LOW (ref 3.5–5.2)
Alkaline Phosphatase: 43 U/L (ref 39–117)
BUN: 8 mg/dL (ref 6–23)
Calcium: 9.2 mg/dL (ref 8.4–10.5)
Chloride: 98 mEq/L (ref 96–112)
Potassium: 3.9 mEq/L (ref 3.5–5.1)
Sodium: 134 mEq/L — ABNORMAL LOW (ref 135–145)
Total Protein: 6.3 g/dL (ref 6.0–8.3)

## 2012-06-28 LAB — POC INFLUENZA A&B (BINAX/QUICKVUE)
Influenza A, POC: NEGATIVE
Influenza B, POC: NEGATIVE

## 2012-06-28 NOTE — Patient Instructions (Addendum)
I am recommending return to work on March 10th  You can stop using the oxygen and the amlodipine  I recommend you purchase  A pulse ox meter at [facility] or [facility] $30-40  Return next Friday
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
## 2012-06-29 ENCOUNTER — Encounter: Payer: Self-pay | Admitting: Internal Medicine

## 2012-06-29 DIAGNOSIS — J9600 Acute respiratory failure, unspecified whether with hypoxia or hypercapnia: Secondary | ICD-10-CM | POA: Insufficient documentation

## 2012-06-29 DIAGNOSIS — E871 Hypo-osmolality and hyponatremia: Secondary | ICD-10-CM | POA: Insufficient documentation

## 2012-06-29 NOTE — Assessment & Plan Note (Signed)
Controlled with lisinopril and amlodipine.  hctz stopped due to low sodium.

## 2012-06-29 NOTE — Assessment & Plan Note (Signed)
Secondary to lifelong tobacco abuse.  PFTS have not been done in several years and will be repeated this year

## 2012-06-29 NOTE — Assessment & Plan Note (Signed)
She is now tobacco abstinent , finally since her recent hospitalization.  Husband is still smoking and was strongly encouraged to quit with her .

## 2012-06-29 NOTE — Assessment & Plan Note (Signed)
Noted during hospitalization,. Attributed to hctz which was dc'd..  Repeat today has improved.

## 2012-06-29 NOTE — Assessment & Plan Note (Signed)
Secondary to COPD exacerbation. Now resolved.  Resting 02 sats on room air were 98%,  With ambulation of 2 minutes 94%.  Will dc 02 after overnight oximetry is done on RA to rule out nocturnal desaturations

## 2012-07-06 ENCOUNTER — Telehealth: Payer: Self-pay | Admitting: Internal Medicine

## 2012-07-06 NOTE — Telephone Encounter (Signed)
FYI. Does not need anything done.

## 2012-07-06 NOTE — Telephone Encounter (Signed)
Pt called regarding her appt 3/7.  Pt is concerned about bad weather that is expected.  Pt does not wish to cancel her appt at this time and will wait to see what the weather does in the morning. Pt does not want to be charged a no show fee if she is unable to make it if the clinic is open.  Advised pt if the clinic has a delay we will call her and we can reschedule her appt. At that time.

## 2012-07-07 ENCOUNTER — Ambulatory Visit: Payer: Managed Care, Other (non HMO) | Admitting: Internal Medicine

## 2012-07-11 ENCOUNTER — Telehealth: Payer: Self-pay | Admitting: Internal Medicine

## 2012-07-11 ENCOUNTER — Ambulatory Visit: Payer: Self-pay | Admitting: Internal Medicine

## 2012-07-11 NOTE — Telephone Encounter (Signed)
Overnight pulse oximetry was fine. Does not need home O2 overnight.

## 2012-07-11 NOTE — Telephone Encounter (Signed)
Left message for pt to return call.

## 2012-07-12 ENCOUNTER — Encounter: Payer: Self-pay | Admitting: Internal Medicine

## 2012-07-12 ENCOUNTER — Encounter: Payer: Self-pay | Admitting: General Practice

## 2012-07-12 ENCOUNTER — Ambulatory Visit (INDEPENDENT_AMBULATORY_CARE_PROVIDER_SITE_OTHER): Payer: Managed Care, Other (non HMO) | Admitting: Internal Medicine

## 2012-07-12 VITALS — BP 102/66 | HR 70 | Temp 98.1°F | Resp 16 | Wt 137.0 lb

## 2012-07-12 DIAGNOSIS — J449 Chronic obstructive pulmonary disease, unspecified: Secondary | ICD-10-CM

## 2012-07-12 DIAGNOSIS — E871 Hypo-osmolality and hyponatremia: Secondary | ICD-10-CM

## 2012-07-12 MED ORDER — ESZOPICLONE 3 MG PO TABS: 3.0000 mg | ORAL_TABLET | Freq: Every day | ORAL | Status: DC

## 2012-07-12 NOTE — Patient Instructions (Addendum)
Take either one dose of wellbutrin daily in late morning or two doses,  At 8 am and 2 PM

## 2012-07-15 ENCOUNTER — Encounter: Payer: Self-pay | Admitting: Internal Medicine
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
## 2012-07-15 NOTE — Assessment & Plan Note (Signed)
Secondary to COPD and pneumonia. Repeat labs were normal.

## 2012-07-15 NOTE — Assessment & Plan Note (Addendum)
Secondary to tobacco abuse. Recent hospitalization for hypercarbic respiratory failure was trigger she needed for tobacco cessation. She has been abstinent now for a month. Pulmonary function tests have been ordered since she is now back to baseline. Supplemental oxygen has been DC'd. Reminded to continue daily use of Spiriva and Symbicort.

## 2012-07-17 NOTE — Telephone Encounter (Signed)
Pt.notified

## 2012-07-31 ENCOUNTER — Other Ambulatory Visit: Payer: Self-pay | Admitting: General Practice

## 2012-07-31 ENCOUNTER — Encounter: Payer: Self-pay | Admitting: Internal Medicine

## 2012-07-31 MED ORDER — LISINOPRIL 10 MG PO TABS: 10.0000 mg | ORAL_TABLET | Freq: Every day | ORAL | Status: DC

## 2012-08-31 ENCOUNTER — Encounter: Payer: Self-pay | Admitting: Adult Health

## 2012-08-31 ENCOUNTER — Ambulatory Visit (INDEPENDENT_AMBULATORY_CARE_PROVIDER_SITE_OTHER): Payer: Managed Care, Other (non HMO) | Admitting: Adult Health

## 2012-08-31 VITALS — BP 100/62 | HR 83 | Temp 98.0°F | Resp 14 | Wt 137.5 lb

## 2012-08-31 DIAGNOSIS — J441 Chronic obstructive pulmonary disease with (acute) exacerbation: Secondary | ICD-10-CM | POA: Insufficient documentation

## 2012-08-31 DIAGNOSIS — R0602 Shortness of breath: Secondary | ICD-10-CM

## 2012-08-31 MED ORDER — ALBUTEROL SULFATE (2.5 MG/3ML) 0.083% IN NEBU: 2.5000 mg | INHALATION_SOLUTION | Freq: Four times a day (QID) | RESPIRATORY_TRACT | Status: DC | PRN

## 2012-08-31 MED ORDER — AZITHROMYCIN 250 MG PO TABS: ORAL_TABLET | ORAL | Status: DC

## 2012-08-31 MED ORDER — ALBUTEROL SULFATE (5 MG/ML) 0.5% IN NEBU
2.5000 mg | INHALATION_SOLUTION | Freq: Once | RESPIRATORY_TRACT | Status: AC
  Administered 2012-08-31: 2.5 mg via RESPIRATORY_TRACT

## 2012-08-31 MED ORDER — PREDNISONE 10 MG PO TABS: ORAL_TABLET | ORAL | Status: DC

## 2012-08-31 NOTE — Progress Notes (Signed)
  Subjective:    Patient ID: Diane [name], female    DOB: 12/01/1955, 57 y.o.   MRN: [id number]  HPI  Patient is a pleasant 57 year old female with multiple medical problems including hypertension, hyperlipidemia, COPD, ongoing tobacco abuse, history of respiratory failure who presents to clinic with c/o shortness of breath which began approximately 3 days ago. She has been using the albuterol inhaler without significant improvement. Also reports dry cough. She hears herself "rattling" in the chest at night time. She is experiencing sinus pressure, congestion and post nasal drip. She has taken Mucinex without any noticeable improvement. Patient smokes occasionally. She had quit for one month and restarted. She has not had one in 2 days.   Current Outpatient Prescriptions on File Prior to Visit  Medication Sig Dispense Refill  . buPROPion (WELLBUTRIN) 75 MG tablet Take 1 tablet (75 mg total) by mouth 2 (two) times daily.  60 tablet  1  . citalopram (CELEXA) 20 MG tablet Take 1 tablet (20 mg total) by mouth daily.  90 tablet  3  . Eszopiclone 3 MG TABS Take 1 tablet (3 mg total) by mouth at bedtime. Take immediately before bedtime  30 tablet  4  . lisinopril (PRINIVIL,ZESTRIL) 10 MG tablet Take 1 tablet (10 mg total) by mouth daily.  90 tablet  1  . LORazepam (ATIVAN) 0.5 MG tablet Take 0.5 mg by mouth daily.      . Red Yeast Rice 600 MG CAPS Take by mouth 2 (two) times daily.       Marland Kitchen SPIRIVA HANDIHALER 18 MCG inhalation capsule PLACE 1 CAPSULE INTO INHALER AND INHALE ONCE EVERY DAY  30 each  6  . SYMBICORT 160-4.5 MCG/ACT inhaler INHALE TWO PUFFS BY MOUTH TWICE DAILY  11 g  PRN  . VENTOLIN HFA 108 (90 BASE) MCG/ACT inhaler INHALE ONE PUFF 4 TIMES DAILY AS NEEDED FOR DYSPNEA  18 g  11  . amLODipine (NORVASC) 10 MG tablet Take 10 mg by mouth daily.       No current facility-administered medications on file prior to visit.     Review of Systems  Constitutional: Negative for fever and chills.   HENT: Positive for congestion, postnasal drip and sinus pressure.   Respiratory: Positive for cough, shortness of breath and wheezing.   Allergic/Immunologic: Positive for environmental allergies.    BP 100/62  Pulse 83  Temp(Src) 98 F (36.7 C) (Oral)  Resp 14  Wt 137 lb 8 oz (62.37 kg)  BMI 22.88 kg/m2  SpO2 97%    Objective:   Physical Exam  Constitutional: She is oriented to person, place, and time. She appears well-developed and well-nourished. No distress.  HENT:  Head: Normocephalic and atraumatic.  Right Ear: External ear normal.  Left Ear: External ear normal.  Mouth/Throat: No oropharyngeal exudate.  Pharyngeal erythema without exudate  Cardiovascular: Normal rate, regular rhythm and normal heart sounds.   No murmur heard. Pulmonary/Chest: Effort normal. No respiratory distress. She has wheezes. She has no rales.  Neurological: She is alert and oriented to person, place, and time.  Skin: Skin is warm and dry.  Psychiatric: She has a normal mood and affect. Her behavior is normal. Judgment and thought content normal.       Assessment & Plan:

## 2012-08-31 NOTE — Assessment & Plan Note (Addendum)
Nebulizer treatment with albuterol given in the office for wheezing. Start azithromycin and prednisone taper. Advised patient about smoking cessation. Continue albuterol nebulizer treatments every 6 hours at home for the next 2-3 days. Patient also has albuterol inhaler. She was instructed not to use both at the same time as these are the same medication. RTC if symptoms are not improved by Monday. Note greater than 25 minutes were spent in face-to-face communication with instruction, assessment and plan of care.

## 2012-08-31 NOTE — Patient Instructions (Addendum)
  Start your Z-pack today.  Also start your prednisone taper.  Use your nebulizer every 6 hours as needed for shortness of breath or wheezing. If you use the nebulizer do not use your inhaler. This is the same medication.  If symptoms do not improve within 3-4 days, please call the office.

## 2012-09-28 ENCOUNTER — Telehealth: Payer: Self-pay | Admitting: *Deleted

## 2012-09-28 NOTE — Telephone Encounter (Signed)
Appointment made for 5/30 at 3 pm patient notified and is satisfied with this appointment.

## 2012-09-28 NOTE — Telephone Encounter (Signed)
Patient stated she has been to urgent care multiple times given steroids not feeling better and refuses to see anyone but primary. Has breathing issue's would like to see you today or tomorrow please advise.

## 2012-09-28 NOTE — Telephone Encounter (Signed)
Ok to give her the 3 pm  appt tomorrow

## 2012-09-29 ENCOUNTER — Ambulatory Visit (INDEPENDENT_AMBULATORY_CARE_PROVIDER_SITE_OTHER): Payer: Managed Care, Other (non HMO) | Admitting: Internal Medicine

## 2012-09-29 ENCOUNTER — Encounter: Payer: Self-pay | Admitting: Internal Medicine

## 2012-09-29 VITALS — BP 138/84 | HR 104 | Temp 97.7°F | Resp 16 | Wt 135.8 lb

## 2012-09-29 DIAGNOSIS — Z720 Tobacco use: Secondary | ICD-10-CM

## 2012-09-29 DIAGNOSIS — J441 Chronic obstructive pulmonary disease with (acute) exacerbation: Secondary | ICD-10-CM

## 2012-09-29 DIAGNOSIS — F17200 Nicotine dependence, unspecified, uncomplicated: Secondary | ICD-10-CM

## 2012-09-29 MED ORDER — DOXYCYCLINE HYCLATE 100 MG PO TABS: 100.0000 mg | ORAL_TABLET | Freq: Two times a day (BID) | ORAL | Status: DC

## 2012-09-29 MED ORDER — PREDNISONE (PAK) 10 MG PO TABS: ORAL_TABLET | ORAL | Status: DC

## 2012-09-29 MED ORDER — HYDROCOD POLST-CHLORPHEN POLST 10-8 MG/5ML PO LQCR: 10.0000 mL | Freq: Every evening | ORAL | Status: DC | PRN

## 2012-09-29 NOTE — Patient Instructions (Addendum)
You have a viral  Syndrome .  The post nasal drip is causing your sore throat and aggravating your COPD .  Lavage your sinuses twice daily with Simply saline nasal spray. Wear a mask if you spend time outside in the pollen  Use benadryl 25 mg  In the evening and zyrtec  During the day  Sudafed PE 10 to 30 every 8 hours to manage the drainage and congestion.  Gargle with salt water often for the sore throat.  I will give you a refill on the cough medication, and a 7 day course of doxycycline,  And a 12 day prednisone taper.  (both sent to [facility])    If the throat is no better  In 3 to 4 days OR  if you develop T > 100.4,  Green nasal discharge,  Or facial pain,
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
## 2012-10-01 ENCOUNTER — Encounter: Payer: Self-pay | Admitting: Internal Medicine

## 2012-10-01 NOTE — Assessment & Plan Note (Signed)
She had quit after her March hospitalization for respiratory failure but has resumed. Encouragement given, she and husband plan to quit together.

## 2012-10-01 NOTE — Assessment & Plan Note (Signed)
Prolonged, with 3 treatments in the past month  Using short prednisone tapers and azithromycin.  Will treat with 12 day prednisone taper and doxycycline.

## 2012-10-30 ENCOUNTER — Telehealth: Payer: Self-pay | Admitting: Internal Medicine

## 2012-10-30 DIAGNOSIS — J441 Chronic obstructive pulmonary disease with (acute) exacerbation: Secondary | ICD-10-CM

## 2012-10-30 NOTE — Telephone Encounter (Signed)
Pt called her insurance co wants her to go to a pulmonary dr.  Rock Nephew would like to go to Dr Ned Clines @ [place].  Can you give her a referral  For this or do you want her to see you first.

## 2012-10-30 NOTE — Telephone Encounter (Signed)
Referral is in process as requested,  But Dr. Kendrick Fries is a much better pulmnologist in my opinion, and he comes to our office.  Is there a particular reason she want to see Diane [name]?

## 2012-10-31 NOTE — Telephone Encounter (Signed)
Left message for patient to return my call.

## 2012-11-01 NOTE — Telephone Encounter (Signed)
Referral is in process as requested to Dr Kendrick Fries

## 2012-11-01 NOTE — Telephone Encounter (Signed)
Patient returned call and stated Dr.McQuaid would be fine just needs late afternoon appointment due to work schedule. Between hours of 3-4 PM.

## 2012-11-01 NOTE — Telephone Encounter (Signed)
Amber has left message for pt regarding appointment.

## 2012-11-02 ENCOUNTER — Other Ambulatory Visit: Payer: Self-pay | Admitting: *Deleted

## 2012-11-02 MED ORDER — CITALOPRAM HYDROBROMIDE 20 MG PO TABS: 20.0000 mg | ORAL_TABLET | Freq: Every day | ORAL | Status: DC

## 2012-11-14 ENCOUNTER — Ambulatory Visit (INDEPENDENT_AMBULATORY_CARE_PROVIDER_SITE_OTHER): Payer: Managed Care, Other (non HMO) | Admitting: Pulmonary Disease

## 2012-11-14 ENCOUNTER — Encounter: Payer: Self-pay | Admitting: Pulmonary Disease

## 2012-11-14 VITALS — BP 142/86 | HR 100 | Temp 98.9°F | Ht 67.0 in | Wt 135.0 lb

## 2012-11-14 DIAGNOSIS — F17200 Nicotine dependence, unspecified, uncomplicated: Secondary | ICD-10-CM

## 2012-11-14 DIAGNOSIS — J449 Chronic obstructive pulmonary disease, unspecified: Secondary | ICD-10-CM

## 2012-11-14 DIAGNOSIS — Z720 Tobacco use: Secondary | ICD-10-CM

## 2012-11-14 DIAGNOSIS — R0602 Shortness of breath: Secondary | ICD-10-CM

## 2012-11-14 DIAGNOSIS — Z8709 Personal history of other diseases of the respiratory system: Secondary | ICD-10-CM

## 2012-11-14 DIAGNOSIS — J441 Chronic obstructive pulmonary disease with (acute) exacerbation: Secondary | ICD-10-CM

## 2012-11-14 MED ORDER — DOXYCYCLINE HYCLATE 50 MG PO CAPS: 100.0000 mg | ORAL_CAPSULE | Freq: Two times a day (BID) | ORAL | Status: AC

## 2012-11-14 MED ORDER — ROFLUMILAST 500 MCG PO TABS: 500.0000 ug | ORAL_TABLET | Freq: Every day | ORAL | Status: DC

## 2012-11-14 MED ORDER — PREDNISONE 10 MG PO TABS: ORAL_TABLET | ORAL | Status: DC

## 2012-11-14 MED ORDER — ALBUTEROL SULFATE (2.5 MG/3ML) 0.083% IN NEBU
2.5000 mg | INHALATION_SOLUTION | Freq: Once | RESPIRATORY_TRACT | Status: AC
  Administered 2012-11-14: 2.5 mg via RESPIRATORY_TRACT

## 2012-11-14 NOTE — Assessment & Plan Note (Signed)
Counseled at length to quit smoking today. She plans to quit smoking with her husband on July 22nd 2014. At this point, she prefers to not use medications.

## 2012-11-14 NOTE — Patient Instructions (Addendum)
Take the prednisone taper as written Take the antibiotic (doxycycline as written)  Quit smoking!  Exercise!  Take the Roflumilast daily  Stop taking Spiriva while you try using the Tudorza twice a day.  If you like the Tudorza better, then call us so we can send a prescription  We will see you back in 3-4 months or sooner if needed

## 2012-11-14 NOTE — Assessment & Plan Note (Signed)
Unfortunately Diane [name] is having another exacerbation of her COPD.  This makes it least 3 exacerbations of COPD in the last year and it sounds like she has averaged it least 2 a year for the last 3 years. She was hospitalized for a severe exacerbation earlier this year.  This is a marker of very severe COPD. Her ongoing tobacco use is contributing greatly and every exacerbation is setting her lung function back significantly.  I will test her height serum IgE level as well as a simple serum allergy blood screen to look for evidence of some sort of allergen which may be contributing to her recurrent exacerbations. However, the most likely explanation is ongoing tobacco use and worsening lung function.  Plan: -Prednisone taper -Doxycycline -Add Roflumilast

## 2012-11-14 NOTE — Progress Notes (Signed)
Subjective:    Patient ID: Diane [name], female    DOB: [birth year], 56 y.o.   MRN: [id number]  HPI  This is a very pleasant 57 year old female with COPD who comes to our clinic to establish care for the same. She had no childhood respiratory problems but unfortunately started smoking approximately 40 years ago. She has smoked at least one pack of cigarettes daily for 40 years and continues to smoke a minimum of 3 cigarettes daily. She was told she had COPD several years ago and has had episodes of bronchitis regularly treated with antibiotics and steroids at least twice a year ever since that diagnosis. 2014 has been a particularly bad year and that she was hospitalized for 8 days for a COPD exacerbation in February at [facility]. Apparently she needed BiPAP initially when admitted and then was discharged on home oxygen. Home oxygen has since been discontinued because her oxygenation improved. She states that every day she has symptoms of shortness of breath. 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 She has a cough which is sometimes productive of mucus but this is not a significant feature of her illness. She uses an albuterol nebulizer at home which helps. She does not exercise regularly.   Past Medical History  Diagnosis Date  . COPD (chronic obstructive pulmonary disease)   . Hyperlipidemia   . Tobacco abuse   . Depression   . Anxiety      Family History  Problem Relation Age of Onset  . Hypertension Mother   . COPD Father      History   Social History  . Marital Status: Married    Spouse Name: craig    Number of Children: N/A  . Years of Education: N/A   Occupational History  .     Social History Main Topics  . Smoking status: Current Some Day Smoker -- 0.25 packs/day    Types: Cigarettes  . Smokeless tobacco: Never Used  . Alcohol Use:  No  . Drug Use: No  . Sexually Active: Not on file   Other Topics Concern  . Not on file   Social History Narrative  . No narrative on file     Allergies  Allergen Reactions  . Aspirin   . Levofloxacin Nausea Only  . Penicillins      Outpatient Prescriptions Prior to Visit  Medication Sig Dispense Refill  . albuterol (PROVENTIL) (2.5 MG/3ML) 0.083% nebulizer solution Take 3 mLs (2.5 mg total) by nebulization every 6 (six) hours as needed for wheezing.  75 mL  12  . buPROPion (WELLBUTRIN) 75 MG tablet Take 1 tablet (75 mg total) by mouth 2 (two) times daily.  60 tablet  1  . chlorpheniramine-HYDROcodone (TUSSIONEX) 10-8 MG/5ML LQCR Take 10 mLs by mouth at bedtime as needed.  240 mL  0  . citalopram (CELEXA) 20 MG tablet Take 1 tablet (20 mg total) by mouth daily.  90 tablet  0  . Eszopiclone 3 MG TABS Take 1 tablet (3 mg total) by mouth at bedtime. Take immediately before bedtime  30 tablet  4  . lisinopril (PRINIVIL,ZESTRIL) 10 MG tablet Take 1 tablet (10 mg total) by mouth daily.  90 tablet  1  . LORazepam (ATIVAN) 0.5 MG tablet Take 0.5 mg by mouth daily.      . Red Yeast Rice 600 MG CAPS  Take by mouth 2 (two) times daily.       Marland Kitchen SPIRIVA HANDIHALER 18 MCG inhalation capsule PLACE 1 CAPSULE INTO INHALER AND INHALE ONCE EVERY DAY  30 each  6  . SYMBICORT 160-4.5 MCG/ACT inhaler INHALE TWO PUFFS BY MOUTH TWICE DAILY  11 g  PRN  . VENTOLIN HFA 108 (90 BASE) MCG/ACT inhaler INHALE ONE PUFF 4 TIMES DAILY AS NEEDED FOR DYSPNEA  18 g  11  . amLODipine (NORVASC) 10 MG tablet Take 10 mg by mouth daily.      . predniSONE (STERAPRED UNI-PAK) 10 MG tablet 6 tablets daily for 2 days , decrease by 1 tablet every 2 days until gone  42 tablet  0   No facility-administered medications prior to visit.      Review of Systems  Constitutional: Negative for fever, chills, diaphoresis, activity change, appetite change, fatigue and unexpected weight change.  HENT: Negative for hearing loss, ear  pain, nosebleeds, congestion, sore throat, facial swelling, rhinorrhea, sneezing, mouth sores, trouble swallowing, neck pain, neck stiffness, dental problem, voice change, postnasal drip, sinus pressure, tinnitus and ear discharge.   Eyes: Negative for photophobia, discharge, itching and visual disturbance.  Respiratory: Positive for cough, chest tightness, shortness of breath and wheezing. Negative for apnea, choking and stridor.   Cardiovascular: Negative for chest pain, palpitations and leg swelling.  Gastrointestinal: Negative for nausea, vomiting, abdominal pain, constipation, blood in stool and abdominal distention.  Genitourinary: Negative for dysuria, urgency, frequency, hematuria, flank pain, decreased urine volume and difficulty urinating.  Musculoskeletal: Negative for myalgias, back pain, joint swelling, arthralgias and gait problem.  Skin: Negative for color change, pallor and rash.  Neurological: Negative for dizziness, tremors, seizures, syncope, speech difficulty, weakness, light-headedness, numbness and headaches.  Hematological: Negative for adenopathy. Does not bruise/bleed easily.  Psychiatric/Behavioral: Negative for confusion, sleep disturbance and agitation. The patient is not nervous/anxious.        Objective:   Physical Exam Filed Vitals:   11/14/12 1622  BP: 142/86  Pulse: 100  Temp: 98.9 F (37.2 C)  TempSrc: Oral  Height: 5\' 7"  (1.702 m)  Weight: 61.236 kg (135 lb)  SpO2: 94%   Gen: well appearing, no acute distress HEENT: NCAT, PERRL, EOMi, OP clear, neck supple without masses PULM: wheezing, poor air movement (after nebulized albuterol), normal percussion bilaterally CV: RRR, no mgr, no JVD AB: BS+, soft, nontender, no hsm Ext: warm, no edema, no clubbing, no cyanosis Derm: no rash or skin breakdown Neuro: A&Ox4, CN II-XII intact, strength 5/5 in all 4 extremities      Assessment & Plan:   COPD (chronic obstructive pulmonary disease) COPD: GOLD  Stage II, Grade C or D based on recurrent exacerbations Combined recommendations from the [employer], [employer], Designer, television/film set, European Respiratory Society (Qaseem A et al, Ann Intern Med. 2011;155(3):179) recommends tobacco cessation, pulmonary rehab (for symptomatic patients with an FEV1 < 50% predicted), supplemental oxygen (for patients with SaO2 <88% or paO2 <55), and appropriate bronchodilator therapy.  In regards to long acting bronchodilators, they recommend monotherapy (FEV1 60-80% with symptoms weak evidence, FEV1 with symptoms <60% strong evidence), or combination therapy (FEV1 <60% with symptoms, strong recommendation, moderate evidence).  One should also provide patients with annual immunizations and consider therapy for prevention of COPD exacerbations (ie. roflumilast or azithromycin) when appopriate.  -O2 therapy: not indicated -Immunizations: up to date -Tobacco use: counseled at length to quit -Exercise: encouraged regular exercise -Bronchodilator therapy: keep symbicort,  try changing spiriva for Tudorza (samples given) -Exacerbation prevention: add roflumilast   COPD exacerbation Unfortunately Zaiyah is having another exacerbation of her COPD.  This makes it least 3 exacerbations of COPD in the last year and it sounds like she has averaged it least 2 a year for the last 3 years. She was hospitalized for a severe exacerbation earlier this year.  This is a marker of very severe COPD. Her ongoing tobacco use is contributing greatly and every exacerbation is setting her lung function back significantly.  I will test her height serum IgE level as well as a simple serum allergy blood screen to look for evidence of some sort of allergen which may be contributing to her recurrent exacerbations. However, the most likely explanation is ongoing tobacco use and worsening lung function.  Plan: -Prednisone  taper -Doxycycline -Add Roflumilast  Tobacco abuse Counseled at length to quit smoking today. She plans to quit smoking with her husband on July 22nd 2014. At this point, she prefers to not use medications.   Updated Medication List Outpatient Encounter Prescriptions as of 11/14/2012  Medication Sig Dispense Refill  . albuterol (PROVENTIL) (2.5 MG/3ML) 0.083% nebulizer solution Take 3 mLs (2.5 mg total) by nebulization every 6 (six) hours as needed for wheezing.  75 mL  12  . buPROPion (WELLBUTRIN) 75 MG tablet Take 1 tablet (75 mg total) by mouth 2 (two) times daily.  60 tablet  1  . chlorpheniramine-HYDROcodone (TUSSIONEX) 10-8 MG/5ML LQCR Take 10 mLs by mouth at bedtime as needed.  240 mL  0  . citalopram (CELEXA) 20 MG tablet Take 1 tablet (20 mg total) by mouth daily.  90 tablet  0  . Eszopiclone 3 MG TABS Take 1 tablet (3 mg total) by mouth at bedtime. Take immediately before bedtime  30 tablet  4  . lisinopril (PRINIVIL,ZESTRIL) 10 MG tablet Take 1 tablet (10 mg total) by mouth daily.  90 tablet  1  . LORazepam (ATIVAN) 0.5 MG tablet Take 0.5 mg by mouth daily.      . Red Yeast Rice 600 MG CAPS Take by mouth 2 (two) times daily.       Marland Kitchen SPIRIVA HANDIHALER 18 MCG inhalation capsule PLACE 1 CAPSULE INTO INHALER AND INHALE ONCE EVERY DAY  30 each  6  . SYMBICORT 160-4.5 MCG/ACT inhaler INHALE TWO PUFFS BY MOUTH TWICE DAILY  11 g  PRN  . VENTOLIN HFA 108 (90 BASE) MCG/ACT inhaler INHALE ONE PUFF 4 TIMES DAILY AS NEEDED FOR DYSPNEA  18 g  11  . [DISCONTINUED] amLODipine (NORVASC) 10 MG tablet Take 10 mg by mouth daily.      . [DISCONTINUED] predniSONE (STERAPRED UNI-PAK) 10 MG tablet 6 tablets daily for 2 days , decrease by 1 tablet every 2 days until gone  42 tablet  0  . doxycycline (VIBRAMYCIN) 50 MG capsule Take 2 capsules (100 mg total) by mouth 2 (two) times daily.  28 capsule  0  . predniSONE (DELTASONE) 10 MG tablet Take 40mg  po daily for 3 days, then take 30mg  po daily for 3 days,  then take 20mg  po daily for two days, then take 10mg  po daily for 2 days  30 tablet  0  . roflumilast (DALIRESP) 500 MCG TABS tablet Take 1 tablet (500 mcg total) by mouth daily.  30 tablet  3  . albuterol (PROVENTIL) (2.5 MG/3ML) 0.083% nebulizer solution 2.5 mg        No facility-administered encounter medications on file as  of 11/14/2012.

## 2012-11-14 NOTE — Assessment & Plan Note (Signed)
COPD: GOLD Stage II, Grade C or D based on recurrent exacerbations Combined recommendations from the [employer], [employer], Designer, television/film set, European Respiratory Society (Qaseem A et al, Ann Intern Med. 2011;155(3):179) recommends tobacco cessation, pulmonary rehab (for symptomatic patients with an FEV1 < 50% predicted), supplemental oxygen (for patients with SaO2 <88% or paO2 <55), and appropriate bronchodilator therapy.  In regards to long acting bronchodilators, they recommend monotherapy (FEV1 60-80% with symptoms weak evidence, FEV1 with symptoms <60% strong evidence), or combination therapy (FEV1 <60% with symptoms, strong recommendation, moderate evidence).  One should also provide patients with annual immunizations and consider therapy for prevention of COPD exacerbations (ie. roflumilast or azithromycin) when appopriate.  -O2 therapy: not indicated -Immunizations: up to date -Tobacco use: counseled at length to quit -Exercise: encouraged regular exercise -Bronchodilator therapy: keep symbicort, try changing spiriva for Tudorza (samples given) -Exacerbation prevention: add roflumilast

## 2012-11-16 ENCOUNTER — Other Ambulatory Visit (INDEPENDENT_AMBULATORY_CARE_PROVIDER_SITE_OTHER): Payer: Managed Care, Other (non HMO)

## 2012-11-16 DIAGNOSIS — R0602 Shortness of breath: Secondary | ICD-10-CM

## 2012-11-17 LAB — ALLERGY FULL PROFILE
Allergen, D pternoyssinus,d7: <0.1 kU/L
Allergen,Goose feathers, e70: <0.1 kU/L
Alternaria Alternata: <0.1 kU/L
Aspergillus fumigatus, m3: <0.1 kU/L
Bermuda Grass: <0.1 kU/L
Cat Dander: <0.1 kU/L
Common Ragweed: <0.1 kU/L
D. farinae: <0.1 kU/L
Elm IgE: <0.1 kU/L
G009 Red Top: <0.1 kU/L
House Dust Hollister: <0.1 kU/L
Lamb's Quarters: <0.1 kU/L
Oak: <0.1 kU/L
Plantain: <0.1 kU/L
Timothy Grass: 0.1 kU/L — ABNORMAL HIGH

## 2012-11-24 ENCOUNTER — Telehealth: Payer: Self-pay | Admitting: Pulmonary Disease

## 2012-11-24 MED ORDER — TIOTROPIUM BROMIDE MONOHYDRATE 18 MCG IN CAPS: 18.0000 ug | ORAL_CAPSULE | Freq: Every day | RESPIRATORY_TRACT | Status: DC

## 2012-11-24 NOTE — Telephone Encounter (Signed)
Spiriva has been refilled. Pt is aware. Nothing further was needed.

## 2012-11-27 NOTE — Progress Notes (Signed)
Quick Note:  Spoke with pt and notified of results per Dr. McQuaid. Pt verbalized understanding and denied any questions.  ______ 

## 2012-11-29 ENCOUNTER — Telehealth: Payer: Self-pay | Admitting: Pulmonary Disease

## 2012-11-29 NOTE — Telephone Encounter (Signed)
Pt states she started Roflumilast on Sunday; on Monday she started having headaches, nausea feelings, tired, and just felt like laying around. She missed work Monday and Tuesday due to the symptoms. She worked today and took the medication around noon with her lunch; she then started vomiting and continues to feel tired and uneasy. BQ is not in the office and will send to PW to advise. Thanks.

## 2012-11-29 NOTE — Telephone Encounter (Signed)
Pt aware to stop Daliresp per PW and will follow up with BQ (due in 3 months) will forward to BQ to advise if he wants patient in sooner than that.

## 2012-11-29 NOTE — Telephone Encounter (Signed)
Need to STOP daliresp, all of these are side effects.  Regroup with Dr Johny Chess at next OV

## 2012-12-06 ENCOUNTER — Telehealth: Payer: Self-pay | Admitting: Pulmonary Disease

## 2012-12-06 ENCOUNTER — Encounter: Payer: Self-pay | Admitting: Pulmonary Disease

## 2012-12-06 ENCOUNTER — Ambulatory Visit (INDEPENDENT_AMBULATORY_CARE_PROVIDER_SITE_OTHER)
Admission: RE | Admit: 2012-12-06 | Discharge: 2012-12-06 | Disposition: A | Discharge status: Home / Self Care | Payer: Managed Care, Other (non HMO) | Source: Ambulatory Visit | Attending: Pulmonary Disease | Admitting: Pulmonary Disease

## 2012-12-06 ENCOUNTER — Ambulatory Visit (INDEPENDENT_AMBULATORY_CARE_PROVIDER_SITE_OTHER): Payer: Managed Care, Other (non HMO) | Admitting: Pulmonary Disease

## 2012-12-06 ENCOUNTER — Encounter: Payer: Self-pay | Admitting: *Deleted

## 2012-12-06 VITALS — BP 120/76 | HR 107 | Temp 97.2°F | Ht 66.0 in | Wt 137.6 lb

## 2012-12-06 DIAGNOSIS — J441 Chronic obstructive pulmonary disease with (acute) exacerbation: Secondary | ICD-10-CM

## 2012-12-06 MED ORDER — PREDNISONE 10 MG PO TABS: ORAL_TABLET | ORAL | Status: DC

## 2012-12-06 NOTE — Telephone Encounter (Signed)
I spoke with pt. She is coming to see RA this afternoon at 1:45. I gave her the address. Nothing further was needed

## 2012-12-06 NOTE — Assessment & Plan Note (Signed)
Take prednisone 10 mg - 4 tabs  daily with food x 4 days, then 3 tabs daily x 4 days, then 2 tabs daily x 4 days, then 1 tab daily x4 days then stop. #40 Stay on symbicort & spiriva CXR today Albuterol nebs as needed for wheezing Smoking cessation emphasised again! Letter for work given

## 2012-12-06 NOTE — Progress Notes (Signed)
  Subjective:    Patient ID: Diane [name], female    DOB: August 05, 1955, 57 y.o.   MRN: [id number]  HPI  57 year old smoker with COPD -fev1 55%  She has smoked at least one pack of cigarettes daily for 40 years and continues to smoke a minimum of 3 cigarettes daily. She had episodes of bronchitis regularly treated with antibiotics and steroids at least twice a year. 2014 has been a particularly bad year and that she was hospitalized for 8 days for a COPD exacerbation in February at [facility]. Apparently she needed BiPAP initially when admitted and then was discharged on home oxygen. Home oxygen has since been discontinued because her oxygenation improved. 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 She has a cough which is sometimes productive of mucus but this is not a significant feature of her illness. She uses an albuterol nebulizer at home which helps. She does not exercise regularly  12/06/2012  BQ pt. Pt reports increase SOB at rest/exertion, chest congestion, cough w/ white phlem (when can get the phlem up), wheezing, and chest tx. All started 3 ds ago O2 satn 90 %, occ wheezing On symbicort & spiriva, stopped roflimulast due to side effects   CXR 12/06/12 no infx/ effusion  Review of Systems neg for any significant sore throat, dysphagia, itching, sneezing, nasal congestion or excess/ purulent secretions, fever, chills, sweats, unintended wt loss, pleuritic or exertional cp, hempoptysis, orthopnea pnd or change in chronic leg swelling. Also denies presyncope, palpitations, heartburn, abdominal pain, nausea, vomiting, diarrhea or change in bowel or urinary habits, dysuria,hematuria, rash, arthralgias, visual complaints, headache, numbness weakness or ataxia.     Objective:   Physical Exam  Gen. Pleasant, thin woman, in no distress ENT - no lesions, no post  nasal drip Neck: No JVD, no thyromegaly, no carotid bruits Lungs: no use of accessory muscles, no dullness to percussion, decreased without rales or rhonchi  Cardiovascular: Rhythm regular, heart sounds  normal, no murmurs or gallops, no peripheral edema Musculoskeletal: No deformities, no cyanosis or clubbing        Assessment & Plan:

## 2012-12-06 NOTE — Patient Instructions (Addendum)
Take prednisone 10 mg - 4 tabs  daily with food x 4 days, then 3 tabs daily x 4 days, then 2 tabs daily x 4 days, then 1 tab daily x4 days then stop. #40 Stay on symbicort & spiriva CXR today Albuterol nebs as needed for wheezing

## 2012-12-07 ENCOUNTER — Ambulatory Visit: Payer: Managed Care, Other (non HMO) | Admitting: Adult Health

## 2012-12-29 ENCOUNTER — Encounter: Payer: Self-pay | Admitting: Adult Health

## 2012-12-29 ENCOUNTER — Ambulatory Visit (INDEPENDENT_AMBULATORY_CARE_PROVIDER_SITE_OTHER): Payer: Managed Care, Other (non HMO) | Admitting: Adult Health

## 2012-12-29 VITALS — BP 120/72 | HR 104 | Temp 97.7°F | Resp 16 | Wt 138.0 lb

## 2012-12-29 DIAGNOSIS — J441 Chronic obstructive pulmonary disease with (acute) exacerbation: Secondary | ICD-10-CM

## 2012-12-29 MED ORDER — PREDNISONE (PAK) 10 MG PO TABS: ORAL_TABLET | ORAL | Status: DC

## 2012-12-29 MED ORDER — DOXYCYCLINE HYCLATE 100 MG PO TABS: 100.0000 mg | ORAL_TABLET | Freq: Two times a day (BID) | ORAL | Status: AC

## 2012-12-29 NOTE — Progress Notes (Signed)
  Subjective:    Patient ID: Diane [name], female    DOB: 1956-01-11, 57 y.o.   MRN: [id number]  HPI  Patient is a pleasant 57 year old female with history of COPD, ongoing tobacco abuse, history of respiratory failure who presents to clinic with sinus pressure, HA, drainage mostly in the morning. Symptoms began Thursday. She has been using her nebulizer and inhalers as prescribed. This morning she woke up feeling short of breath.    Current Outpatient Prescriptions on File Prior to Visit  Medication Sig Dispense Refill  . albuterol (PROVENTIL) (2.5 MG/3ML) 0.083% nebulizer solution Take 3 mLs (2.5 mg total) by nebulization every 6 (six) hours as needed for wheezing.  75 mL  12  . buPROPion (WELLBUTRIN) 75 MG tablet Take 1 tablet (75 mg total) by mouth 2 (two) times daily.  60 tablet  1  . citalopram (CELEXA) 20 MG tablet Take 1 tablet (20 mg total) by mouth daily.  90 tablet  0  . lisinopril (PRINIVIL,ZESTRIL) 10 MG tablet Take 1 tablet (10 mg total) by mouth daily.  90 tablet  1  . LORazepam (ATIVAN) 0.5 MG tablet Take 0.5 mg by mouth daily.      . Red Yeast Rice 600 MG CAPS Take by mouth 2 (two) times daily.       . SYMBICORT 160-4.5 MCG/ACT inhaler INHALE TWO PUFFS BY MOUTH TWICE DAILY  11 g  PRN  . tiotropium (SPIRIVA HANDIHALER) 18 MCG inhalation capsule Place 1 capsule (18 mcg total) into inhaler and inhale daily.  30 capsule  2  . VENTOLIN HFA 108 (90 BASE) MCG/ACT inhaler INHALE ONE PUFF 4 TIMES DAILY AS NEEDED FOR DYSPNEA  18 g  11  . Eszopiclone 3 MG TABS Take 1 tablet (3 mg total) by mouth at bedtime. Take immediately before bedtime  30 tablet  4   No current facility-administered medications on file prior to visit.     Review of Systems  Constitutional: Negative for fever and chills.  HENT: Positive for congestion, rhinorrhea, postnasal drip and sinus pressure.   Respiratory: Positive for cough and shortness of breath.     BP 120/72  Pulse 104  Temp(Src) 97.7 F (36.5  C) (Oral)  Resp 16  Wt 138 lb (62.596 kg)  BMI 22.28 kg/m2  SpO2 99%     Objective:   Physical Exam  Constitutional: She is oriented to person, place, and time. She appears well-developed and well-nourished. No distress.  HENT:  Head: Normocephalic and atraumatic.  Pharyngeal drainage  Cardiovascular: Exam reveals no gallop.   No murmur heard. Tachycardia, regular rhythm  Pulmonary/Chest: Effort normal and breath sounds normal. No respiratory distress. She has no wheezes. She has no rales.  Lymphadenopathy:    She has no cervical adenopathy.  Neurological: She is alert and oriented to person, place, and time.  Skin: Skin is warm and dry.  Psychiatric: She has a normal mood and affect. Her behavior is normal. Judgment and thought content normal.      Assessment & Plan:

## 2012-12-29 NOTE — Patient Instructions (Addendum)
  Start the prednisone taper as instructed.  Also start the Doxycycline 100 mg twice a day for 7 days.  Continue the nebulizer every 6 hours as needed for shortness of breath or wheezing.  Please go straight to the ED or call 911 if your shortness of breath worsens or if you begin to get confused.

## 2012-12-29 NOTE — Assessment & Plan Note (Signed)
Patient presents with sinus symptoms that have triggered COPD exacerbation. Start prednisone taper, doxycycline 100 mg twice a day x7 days. Continue nebulizer every 6 hours as needed for shortness of breath, wheezing, cough. Patient uses other inhalers as instructed. RTC if symptoms are not improved within 3-4 days. I have advised patient to go straight to the emergency room or to call 911 if she develops increased shortness of breath or confusion.

## 2013-01-15 ENCOUNTER — Encounter: Payer: Self-pay | Admitting: Internal Medicine

## 2013-01-15 ENCOUNTER — Ambulatory Visit (INDEPENDENT_AMBULATORY_CARE_PROVIDER_SITE_OTHER): Payer: Managed Care, Other (non HMO) | Admitting: Internal Medicine

## 2013-01-15 VITALS — BP 142/82 | HR 91 | Temp 98.1°F | Wt 139.0 lb

## 2013-01-15 DIAGNOSIS — R079 Chest pain, unspecified: Secondary | ICD-10-CM | POA: Insufficient documentation

## 2013-01-15 DIAGNOSIS — M62838 Other muscle spasm: Secondary | ICD-10-CM

## 2013-01-15 DIAGNOSIS — K219 Gastro-esophageal reflux disease without esophagitis: Secondary | ICD-10-CM | POA: Insufficient documentation

## 2013-01-15 MED ORDER — TIZANIDINE HCL 2 MG PO CAPS: 2.0000 mg | ORAL_CAPSULE | Freq: Three times a day (TID) | ORAL | Status: DC | PRN

## 2013-01-15 MED ORDER — PANTOPRAZOLE SODIUM 40 MG PO TBEC: 40.0000 mg | DELAYED_RELEASE_TABLET | Freq: Every day | ORAL | Status: DC

## 2013-01-15 NOTE — Assessment & Plan Note (Signed)
Symptoms consistent with GERD, likely exacerbated by recent use of prednisone. Will start pantoprazole 40 mg daily. Patient will followup in one week for recheck or sooner if symptoms are not improving.

## 2013-01-15 NOTE — Progress Notes (Signed)
Subjective:    Patient ID: Diane [name], female    DOB: [birth year], 57 y.o.   MRN: [id number]  HPI 57 year old female with history of tobacco abuse, COPD, hypertension presents for acute visit complaining of bilateral neck pain over the last several days. Pain is described as aching on the sides of her neck. She denies any new physical activities or any recent injury. She does note increased stress recently. She has not generally taking any medication for pain.  She also notes some mild epigastric pain and right-sided chest pain over the last several days. The episodes last for less than an hour. They have been improved with intake of TUMS. However, the pain returns a few hours later. Notably, she was recently on a prednisone taper pack for COPD exacerbation. She reports she has been on prednisone frequently over the last several months. During episodes of chest pain, she denies palpitations, shortness of breath, diaphoresis. She did have a stress test in the distant past which she reports was normal.  Outpatient Prescriptions Prior to Visit  Medication Sig Dispense Refill  . albuterol (PROVENTIL) (2.5 MG/3ML) 0.083% nebulizer solution Take 3 mLs (2.5 mg total) by nebulization every 6 (six) hours as needed for wheezing.  75 mL  12  . citalopram (CELEXA) 20 MG tablet Take 1 tablet (20 mg total) by mouth daily.  90 tablet  0  . Eszopiclone 3 MG TABS Take 1 tablet (3 mg total) by mouth at bedtime. Take immediately before bedtime  30 tablet  4  . lisinopril (PRINIVIL,ZESTRIL) 10 MG tablet Take 1 tablet (10 mg total) by mouth daily.  90 tablet  1  . LORazepam (ATIVAN) 0.5 MG tablet Take 0.5 mg by mouth daily.      . Red Yeast Rice 600 MG CAPS Take by mouth 2 (two) times daily.       . SYMBICORT 160-4.5 MCG/ACT inhaler INHALE TWO PUFFS BY MOUTH TWICE DAILY  11 g  PRN  . tiotropium (SPIRIVA HANDIHALER) 18 MCG inhalation capsule Place 1 capsule (18 mcg total) into inhaler and inhale daily.  30 capsule  2   . VENTOLIN HFA 108 (90 BASE) MCG/ACT inhaler INHALE ONE PUFF 4 TIMES DAILY AS NEEDED FOR DYSPNEA  18 g  11  . buPROPion (WELLBUTRIN) 75 MG tablet Take 1 tablet (75 mg total) by mouth 2 (two) times daily.  60 tablet  1  . predniSONE (STERAPRED UNI-PAK) 10 MG tablet 6 tablets daily for 2 days , decrease by 1 tablet every 2 days until gone  42 tablet  0   No facility-administered medications prior to visit.   BP 142/82  Pulse 91  Temp(Src) 98.1 F (36.7 C) (Oral)  Wt 139 lb (63.05 kg)  BMI 22.45 kg/m2  SpO2 96%  Review of Systems  Constitutional: Positive for fatigue. Negative for fever, chills, appetite change and unexpected weight change.  HENT: Positive for neck pain and neck stiffness. Negative for ear pain, congestion, sore throat, trouble swallowing, voice change and sinus pressure.   Eyes: Negative for visual disturbance.  Respiratory: Negative for cough, shortness of breath, wheezing and stridor.   Cardiovascular: Positive for chest pain. Negative for palpitations and leg swelling.  Gastrointestinal: Positive for abdominal pain (upper epigastric). Negative for nausea, vomiting, diarrhea, constipation, blood in stool, abdominal distention and anal bleeding.  Genitourinary: Negative for dysuria and flank pain.  Musculoskeletal: Negative for myalgias, arthralgias and gait problem.  Skin: Negative for color change and rash.  Neurological: Negative for  dizziness and headaches.  Hematological: Negative for adenopathy. Does not bruise/bleed easily.  Psychiatric/Behavioral: Negative for suicidal ideas, sleep disturbance and dysphoric mood. The patient is nervous/anxious.        Objective:   Physical Exam  Constitutional: She is oriented to person, place, and time. She appears well-developed and well-nourished. No distress.  HENT:  Head: Normocephalic and atraumatic.  Right Ear: External ear normal.  Left Ear: External ear normal.  Nose: Nose normal.  Mouth/Throat: Oropharynx is  clear and moist. No oropharyngeal exudate.  Eyes: Conjunctivae are normal. Pupils are equal, round, and reactive to light. Right eye exhibits no discharge. Left eye exhibits no discharge. No scleral icterus.  Neck: Normal range of motion. Neck supple. No tracheal deviation present. No thyromegaly present.  Cardiovascular: Normal rate, regular rhythm, normal heart sounds and intact distal pulses.  Exam reveals no gallop and no friction rub.   No murmur heard. Pulmonary/Chest: Effort normal and breath sounds normal. No accessory muscle usage. Not tachypneic. No respiratory distress. She has no decreased breath sounds. She has no wheezes. She has no rhonchi. She has no rales. She exhibits no tenderness.  Abdominal: There is no tenderness.  Musculoskeletal: She exhibits no edema.       Cervical back: She exhibits tenderness, pain and spasm. She exhibits normal range of motion and no bony tenderness.  Lymphadenopathy:    She has no cervical adenopathy.  Neurological: She is alert and oriented to person, place, and time. No cranial nerve deficit. She exhibits normal muscle tone. Coordination normal.  Skin: Skin is warm and dry. No rash noted. She is not diaphoretic. No erythema. No pallor.  Psychiatric: She has a normal mood and affect. Her behavior is normal. Judgment and thought content normal.          Assessment & Plan:

## 2013-01-15 NOTE — Assessment & Plan Note (Signed)
Recent symptoms of right sided chest pain, likely related to GERD. Non-specific changes on EKG including T-wave inversion. However pt has significant risk factors for CAD including tobacco use. Will set up cardiology evaluation. Question if stress test might be helpful for further risk stratification.

## 2013-01-15 NOTE — Assessment & Plan Note (Signed)
Exam is most consistent with spasm of the trapezius muscle. Will start Zanaflex. If no improvement, consider physical therapy.

## 2013-01-17 ENCOUNTER — Telehealth: Payer: Self-pay | Admitting: Internal Medicine

## 2013-01-17 NOTE — Telephone Encounter (Signed)
Fwd to Dr. Walker 

## 2013-01-17 NOTE — Telephone Encounter (Signed)
It sounds like Dr Dan Humphreys idenitfied two separate issue:  Muscle spasm and esophagitis,  So imaging will depend on which pain she is still having,  Neck pain or chest/esophageal pain. She was referred to cardiology and should keep that appt but she should see me too

## 2013-01-17 NOTE — Telephone Encounter (Signed)
Pt was seen by Dr. Dan Humphreys and she says the Muscle aren't not working at all and she still has pain between her shoulders. She is wanting to know what Dr. Dan Humphreys wanted to do ?? If she should be seen again or try something else ??

## 2013-01-17 NOTE — Telephone Encounter (Signed)
Pt called back.  Asks if you can call her home phone [phone].

## 2013-01-17 NOTE — Telephone Encounter (Signed)
When I saw her she was having pain in her upper back, but not weakness. She should be seen if she has weakness. We may need to do imaging for evaluation and/or set up PT

## 2013-01-17 NOTE — Telephone Encounter (Signed)
Spoke with patient she state she is not having any weakness she is still in a lot of pain. What kind of imaging you think she may need? Also she thought her follow up appointment was with you next week but they scheduled it with [place].

## 2013-01-17 NOTE — Telephone Encounter (Signed)
Was speaking with patient then phone was disconnected and went to a busy signal

## 2013-01-18 NOTE — Telephone Encounter (Signed)
Patient given appt for  1145 tomorrow

## 2013-01-18 NOTE — Telephone Encounter (Signed)
This will have to be hadnles by Dr Dan Humphreys since I have not treated her for this particular issue

## 2013-01-18 NOTE — Telephone Encounter (Signed)
We should see her back tomorrow to assess.

## 2013-01-18 NOTE — Telephone Encounter (Signed)
Spoke with patient she state her main problem is the pain she is still having between her shoulder blades and the muscle relaxants are not helping at all. She is taking 2 of the Zanaflex given to her by Dr. Dan Humphreys but no relief. She is missing days out of work and would like to know what should she do about this pain, if anything else could be given to her? She will also be needing a note for work since she is missing days. Please advise.

## 2013-01-18 NOTE — Telephone Encounter (Signed)
7:45?

## 2013-01-18 NOTE — Telephone Encounter (Signed)
Where can I put her?

## 2013-01-19 ENCOUNTER — Ambulatory Visit: Payer: Managed Care, Other (non HMO) | Admitting: Internal Medicine

## 2013-01-19 ENCOUNTER — Encounter: Payer: Self-pay | Admitting: Internal Medicine

## 2013-01-19 ENCOUNTER — Ambulatory Visit (INDEPENDENT_AMBULATORY_CARE_PROVIDER_SITE_OTHER): Payer: Managed Care, Other (non HMO) | Admitting: Internal Medicine

## 2013-01-19 VITALS — BP 130/90 | HR 80 | Temp 98.1°F | Ht 66.0 in | Wt 137.5 lb

## 2013-01-19 DIAGNOSIS — M62838 Other muscle spasm: Secondary | ICD-10-CM

## 2013-01-19 MED ORDER — TIZANIDINE HCL 6 MG PO CAPS: 6.0000 mg | ORAL_CAPSULE | Freq: Three times a day (TID) | ORAL | Status: DC | PRN

## 2013-01-19 MED ORDER — TRAMADOL HCL 50 MG PO TABS: 50.0000 mg | ORAL_TABLET | Freq: Three times a day (TID) | ORAL | Status: DC | PRN

## 2013-01-19 NOTE — Assessment & Plan Note (Signed)
Persistent spasm of trapezius muscle bilaterally. Improved with Zanaflex. Will increase dose of Zanaflex and add tramadol prn. Will set up PT. Question if she might benefit from TENS unit.

## 2013-01-19 NOTE — Progress Notes (Signed)
Subjective:    Patient ID: Diane [name], female    DOB: 1956/03/11, 57 y.o.   MRN: [id number]  HPI 57YO female presents for follow up back/neck pain. Symptoms improved with use of Zanaflex, however still having significant pain, described as cramping or aching in bilateral upper back and neck. No weakness, numbness or other symptoms. Earlier symptoms of right sided chest pain have resolved. Pt was unable to work yesterday because of pain.  Outpatient Encounter Prescriptions as of 01/19/2013  Medication Sig Dispense Refill  . albuterol (PROVENTIL) (2.5 MG/3ML) 0.083% nebulizer solution Take 3 mLs (2.5 mg total) by nebulization every 6 (six) hours as needed for wheezing.  75 mL  12  . buPROPion (WELLBUTRIN) 75 MG tablet Take 1 tablet (75 mg total) by mouth 2 (two) times daily.  60 tablet  1  . citalopram (CELEXA) 20 MG tablet Take 1 tablet (20 mg total) by mouth daily.  90 tablet  0  . Eszopiclone 3 MG TABS Take 1 tablet (3 mg total) by mouth at bedtime. Take immediately before bedtime  30 tablet  4  . lisinopril (PRINIVIL,ZESTRIL) 10 MG tablet Take 1 tablet (10 mg total) by mouth daily.  90 tablet  1  . LORazepam (ATIVAN) 0.5 MG tablet Take 0.5 mg by mouth daily.      . pantoprazole (PROTONIX) 40 MG tablet Take 1 tablet (40 mg total) by mouth daily.  30 tablet  3  . Red Yeast Rice 600 MG CAPS Take by mouth 2 (two) times daily.       . SYMBICORT 160-4.5 MCG/ACT inhaler INHALE TWO PUFFS BY MOUTH TWICE DAILY  11 g  PRN  . tiotropium (SPIRIVA HANDIHALER) 18 MCG inhalation capsule Place 1 capsule (18 mcg total) into inhaler and inhale daily.  30 capsule  2  . tizanidine (ZANAFLEX) 6 MG capsule Take 1 capsule (6 mg total) by mouth 3 (three) times daily as needed for muscle spasms.  90 capsule  1  . VENTOLIN HFA 108 (90 BASE) MCG/ACT inhaler INHALE ONE PUFF 4 TIMES DAILY AS NEEDED FOR DYSPNEA  18 g  11   No facility-administered encounter medications on file as of 01/19/2013.    Review of Systems   Constitutional: Negative for fever, chills, appetite change, fatigue and unexpected weight change.  HENT: Positive for neck pain and neck stiffness (upper back).   Eyes: Negative for visual disturbance.  Respiratory: Negative for cough and shortness of breath.   Cardiovascular: Negative for chest pain and leg swelling.  Gastrointestinal: Negative for nausea, abdominal pain, diarrhea, constipation, blood in stool, abdominal distention and anal bleeding.  Genitourinary: Negative for dysuria and flank pain.  Musculoskeletal: Positive for myalgias, back pain and arthralgias. Negative for gait problem.  Skin: Negative for color change and rash.  Neurological: Negative for dizziness, weakness, numbness and headaches.  Psychiatric/Behavioral: Negative for suicidal ideas, sleep disturbance and dysphoric mood. The patient is not nervous/anxious.        Objective:   Physical Exam  Constitutional: She is oriented to person, place, and time. She appears well-developed and well-nourished. No distress.  HENT:  Head: Normocephalic and atraumatic.  Right Ear: External ear normal.  Left Ear: External ear normal.  Nose: Nose normal.  Mouth/Throat: Oropharynx is clear and moist. No oropharyngeal exudate.  Eyes: Conjunctivae are normal. Pupils are equal, round, and reactive to light. Right eye exhibits no discharge. Left eye exhibits no discharge. No scleral icterus.  Neck: Normal range of motion. Neck supple. No  tracheal deviation present. No thyromegaly present.  Cardiovascular: Normal rate, regular rhythm, normal heart sounds and intact distal pulses.  Exam reveals no gallop and no friction rub.   No murmur heard. Pulmonary/Chest: Effort normal and breath sounds normal. No accessory muscle usage. Not tachypneic. No respiratory distress. She has no decreased breath sounds. She has no wheezes. She has no rhonchi. She has no rales. She exhibits no tenderness.  Musculoskeletal: Normal range of motion. She  exhibits no edema.       Cervical back: She exhibits tenderness, pain and spasm. She exhibits normal range of motion and no bony tenderness.  Spasm bilateral trapezius  Lymphadenopathy:    She has no cervical adenopathy.  Neurological: She is alert and oriented to person, place, and time. No cranial nerve deficit. She exhibits normal muscle tone. Coordination normal.  Skin: Skin is warm and dry. No rash noted. She is not diaphoretic. No erythema. No pallor.  Psychiatric: She has a normal mood and affect. Her behavior is normal. Judgment and thought content normal.          Assessment & Plan:

## 2013-01-23 ENCOUNTER — Telehealth: Payer: Self-pay | Admitting: *Deleted

## 2013-01-23 ENCOUNTER — Encounter: Payer: Self-pay | Admitting: Internal Medicine

## 2013-01-23 ENCOUNTER — Ambulatory Visit: Payer: Managed Care, Other (non HMO) | Admitting: Internal Medicine

## 2013-01-23 ENCOUNTER — Ambulatory Visit (INDEPENDENT_AMBULATORY_CARE_PROVIDER_SITE_OTHER): Payer: Managed Care, Other (non HMO) | Admitting: Internal Medicine

## 2013-01-23 VITALS — BP 130/78 | HR 88 | Temp 97.9°F | Resp 16 | Ht 66.0 in | Wt 139.2 lb

## 2013-01-23 DIAGNOSIS — J441 Chronic obstructive pulmonary disease with (acute) exacerbation: Secondary | ICD-10-CM

## 2013-01-23 DIAGNOSIS — F17200 Nicotine dependence, unspecified, uncomplicated: Secondary | ICD-10-CM

## 2013-01-23 DIAGNOSIS — Z720 Tobacco use: Secondary | ICD-10-CM

## 2013-01-23 MED ORDER — ALBUTEROL SULFATE (2.5 MG/3ML) 0.083% IN NEBU
2.5000 mg | INHALATION_SOLUTION | Freq: Once | RESPIRATORY_TRACT | Status: AC
  Administered 2013-01-23: 2.5 mg via RESPIRATORY_TRACT

## 2013-01-23 MED ORDER — MOMETASONE FURO-FORMOTEROL FUM 200-5 MCG/ACT IN AERO: 2.0000 | INHALATION_SPRAY | Freq: Two times a day (BID) | RESPIRATORY_TRACT | Status: DC

## 2013-01-23 MED ORDER — METHYLPREDNISOLONE ACETATE 40 MG/ML IJ SUSP
40.0000 mg | Freq: Once | INTRAMUSCULAR | Status: AC
  Administered 2013-01-23: 40 mg via INTRAMUSCULAR

## 2013-01-23 MED ORDER — AEROCHAMBER PLUS MISC: Status: DC

## 2013-01-23 MED ORDER — PREDNISONE 10 MG PO TABS: ORAL_TABLET | ORAL | Status: DC

## 2013-01-23 MED ORDER — HYDROCOD POLST-CHLORPHEN POLST 10-8 MG/5ML PO LQCR: 10.0000 mL | Freq: Two times a day (BID) | ORAL | Status: DC | PRN
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
## 2013-01-23 NOTE — Patient Instructions (Addendum)
I am changing your symbicort to [facility]:  2 puffs twice daily USE WITH THE SPACER I HAVE PRESCRIBED AND SENT TO [facility] You have 6 weeks of samples  Tussionex cough medicine twice daily as needed PREDNISONE TAPER  6 tablets daily x 4 4 tablets daily x 4 days 2 tablets daily x 4 days 1 tablet daily x   4 days 1/2 tablet daily x 4 days   20 days  Make appt to follow up with dr Kendrick Fries   YOU AND CRAIG NEED TO STOP SMOKING NOW!!!!!

## 2013-01-23 NOTE — Telephone Encounter (Signed)
Clarification on medication prescribed today and given per Dr. Darrick Huntsman directions.

## 2013-01-25 NOTE — Assessment & Plan Note (Signed)
Strong advisory counseling given. Both she and her husband have been advised to quit immediately. They are both my patients and can return for assistance with Chantix if they decide to except pharmacotherapy.

## 2013-01-25 NOTE — Assessment & Plan Note (Signed)
Secondary to severe disease and ongoing tobacco abuse. I am changing her inhaler from Symbicort sclera. Repeat steroid taper. Very frank discussion with patient about her need to take responsibility for worsening disease secondary to tobacco use. I've counseled her that she and her husband need to quit smoking or her prognosis shortness considerably.

## 2013-01-30 ENCOUNTER — Encounter: Payer: Self-pay | Admitting: Cardiovascular Disease

## 2013-01-30 ENCOUNTER — Ambulatory Visit (INDEPENDENT_AMBULATORY_CARE_PROVIDER_SITE_OTHER): Payer: Managed Care, Other (non HMO) | Admitting: Cardiovascular Disease

## 2013-01-30 VITALS — BP 120/90 | HR 92 | Ht 67.0 in | Wt 142.2 lb

## 2013-01-30 VITALS — BP 134/81 | HR 82 | Ht 67.0 in | Wt 142.2 lb

## 2013-01-30 DIAGNOSIS — I10 Essential (primary) hypertension: Secondary | ICD-10-CM

## 2013-01-30 DIAGNOSIS — R079 Chest pain, unspecified: Secondary | ICD-10-CM

## 2013-01-30 DIAGNOSIS — R011 Cardiac murmur, unspecified: Secondary | ICD-10-CM

## 2013-01-30 DIAGNOSIS — R0602 Shortness of breath: Secondary | ICD-10-CM

## 2013-01-30 NOTE — Assessment & Plan Note (Signed)
She has a cardiac murmur suggestive of aortic sclerosis or mild stenosis with no previous evaluation. Given her significant associated shortness of breath, I will obtain an echocardiogram.

## 2013-01-30 NOTE — Assessment & Plan Note (Signed)
This is likely related to GERD. However, she had significant exertional dyspnea on multiple risk factors for coronary artery disease. Thus, I proceeded with a treadmill stress test which was negative for ischemia at slightly submaximal work load. I discussed with the patient the importance of lifestyle changes in order to decrease the chance of future coronary artery disease and cardiovascular events. We discussed the importance of controlling risk factors, healthy diet, quitting tobacco as well as regular exercise. I also explained to him that a normal stress test does not rule out atherosclerosis.

## 2013-01-30 NOTE — Patient Instructions (Addendum)
Stress test is fine.  Proceed with echocardiogram.

## 2013-01-30 NOTE — Patient Instructions (Addendum)
Your physician has requested that you have an exercise tolerance test. For further information please visit [email or web address]. Please also follow instruction sheet, as given.  Your physician has requested that you have an echocardiogram. Echocardiography is a painless test that uses sound waves to create images of your heart. It provides your doctor with information about the size and shape of your heart and how well your heart's chambers and valves are working. This procedure takes approximately one hour. There are no restrictions for this procedure.  Follow up as needed  
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
## 2013-01-30 NOTE — Procedures (Signed)
    Treadmill Stress test  Indication: Atypical chest pain and exertional dyspnea.  Baseline Data:  Resting EKG shows NSR with rate of 92 bpm, no significant ST or T wave changes Resting blood pressure of 120/90 mm Hg Stand bruce protocal was used.  Exercise Data:  Patient exercised for 6 min 9 sec,  Peak heart rate of 135 bpm.  This was 82 % of the maximum predicted heart rate. No symptoms of chest pain or lightheadedness were reported at peak stress or in recovery.  Peak Blood pressure recorded was 170/92 Maximal work level: 7 METs.  Heart rate at 3 minutes in recovery was 95 bpm. BP response: Normal HR response: Normal  EKG with Exercise: Sinus tachycardia with no significant ST changes.  FINAL IMPRESSION: Normal exercise stress test at slightly submaximal workload (82% MPHR) . No significant EKG changes concerning for ischemia. Decreased exercise tolerance likely due to COPD.  Recommendation: Proceed with an echocardiogram to evaluate dyspnea and cardiac murmur.

## 2013-02-06 ENCOUNTER — Telehealth: Payer: Self-pay | Admitting: Internal Medicine

## 2013-02-06 NOTE — Telephone Encounter (Signed)
Pt left vm.  States she is struggling with her depression.  States she spoke with her insurance company and was advised to contact Dr. Darrick Huntsman to see if she could increase her Celexa or change her to something else.  Pt states to call her if she will need an appointment to have this done.

## 2013-02-07 ENCOUNTER — Other Ambulatory Visit: Payer: Self-pay | Admitting: *Deleted

## 2013-02-07 MED ORDER — ESZOPICLONE 3 MG PO TABS: 3.0000 mg | ORAL_TABLET | Freq: Every day | ORAL | Status: DC

## 2013-02-07 NOTE — Telephone Encounter (Signed)
Last visit 01/23/13

## 2013-02-08 NOTE — Telephone Encounter (Signed)
Rx faxed to pharmacy  

## 2013-02-16 ENCOUNTER — Ambulatory Visit: Payer: Managed Care, Other (non HMO) | Admitting: Internal Medicine

## 2013-02-20 ENCOUNTER — Encounter: Payer: Self-pay | Admitting: Pulmonary Disease

## 2013-02-20 ENCOUNTER — Ambulatory Visit (INDEPENDENT_AMBULATORY_CARE_PROVIDER_SITE_OTHER): Payer: Managed Care, Other (non HMO) | Admitting: Pulmonary Disease

## 2013-02-20 VITALS — BP 112/72 | HR 82 | Temp 98.4°F | Ht 66.0 in | Wt 141.0 lb

## 2013-02-20 DIAGNOSIS — Z720 Tobacco use: Secondary | ICD-10-CM

## 2013-02-20 DIAGNOSIS — F17200 Nicotine dependence, unspecified, uncomplicated: Secondary | ICD-10-CM

## 2013-02-20 DIAGNOSIS — J449 Chronic obstructive pulmonary disease, unspecified: Secondary | ICD-10-CM

## 2013-02-20 NOTE — Progress Notes (Signed)
Subjective:    Patient ID: Diane [name], female    DOB: 1955-09-14, 57 y.o.   MRN: [id number]   Synopsis: Diane [name] first saw the [facility] pulmonary clinic in July 2014 for COPD. She has had multiple hospitalizations and doctor visits for exacerbations of COPD. As of October of 2014 she continues to smoke cigarettes. A 2014 FEV1 was 55% predicted.    HPI  02/20/2013 ROV > Diane [name] says that she is doing better since the last visit.  She feels that the Spiriva is better since the last visit.  One month ago she was given prednisone by Dr. Darrick Huntsman and she was given [facility].  She feels that she is doing better.  She still hsa periods of dyspnea.  Typically later in the day she will feel chest tightness.  This has increased in the last two weeks.  She continues to smoke, but only one cigarette in the last three days.  She has been walking more lately, 15 minutes a day.    Cough has picked up in the last few days.  She has dry cough.  She has post nasal drip and sinus congestion.    Past Medical History  Diagnosis Date  . COPD (chronic obstructive pulmonary disease)   . Hyperlipidemia   . Tobacco abuse   . Depression   . Anxiety   . Hypertension      Review of Systems  Constitutional: Negative for fever, activity change and fatigue.  HENT: Positive for congestion, postnasal drip, rhinorrhea and sinus pressure.   Respiratory: Positive for cough, shortness of breath and wheezing.   Cardiovascular: Negative for chest pain, palpitations and leg swelling.       Objective:   Physical Exam  Filed Vitals:   02/20/13 1550  BP: 112/72  Pulse: 82  Temp: 98.4 F (36.9 C)  TempSrc: Oral  Height: 5\' 6"  (1.676 m)  Weight: 141 lb (63.957 kg)  SpO2: 96%   Gen: no acute distress HEENT: NCAT, EOMi PULM: CTA B, minimal exp wheezing, prolonged expiratory phase CV: RRR, no mgr AB: BS+, soft, nontender Ext: warm, no edema  February 2014 CT chest at [facility]>>  no pulmonary embolism, there is moderate to severe centrilobular emphysema in the upper lobes right greater than left bilaterally, no other market pulmonary abnormality March 2014 full pulmonary function testing at [facility]>> ratio 43%, FEV1 1.42 L (55% predicted), TLC 5.74 L (108% predicted), residual volume 2.46 L (125% predicted), DLCO 52% predicted, FEV1 improved more than 12% with a bronchodilator     Assessment & Plan:   COPD (chronic obstructive pulmonary disease) Diane [name] continues to struggle with recurrent COPD exacerbations for one reason and one reason only: continued smoking.  We discussed this at length today.  She is not in an exacerbation right now, but she has noticed slightly increased chest tightness with sinus congestion lately.  She plans to quit smoking next week.  Plan: -quit smoking see below -continue spiriva -prednisone Rx given, advised to use if symptoms worsen, but she needs to stop smoking first! -continue Dulera   Tobacco abuse She does not want to use Chantix.  She plans to quit on November 3.  We discussed risks and benefits of the e-cigarette today    Updated Medication List Outpatient Encounter Prescriptions as of 02/20/2013  Medication Sig Dispense Refill  . albuterol (PROVENTIL) (2.5 MG/3ML) 0.083% nebulizer solution Take 3 mLs (2.5 mg total) by nebulization every 6 (six) hours as  needed for wheezing.  75 mL  12  . buPROPion (WELLBUTRIN) 75 MG tablet Take 1 tablet (75 mg total) by mouth 2 (two) times daily.  60 tablet  1  . chlorpheniramine-HYDROcodone (TUSSIONEX) 10-8 MG/5ML LQCR Take 10 mLs by mouth every 12 (twelve) hours as needed.  240 mL  0  . citalopram (CELEXA) 20 MG tablet Take 1 tablet (20 mg total) by mouth daily.  90 tablet  0  . Eszopiclone 3 MG TABS Take 1 tablet (3 mg total) by mouth at bedtime. Take immediately before bedtime  30 tablet  4  . lisinopril (PRINIVIL,ZESTRIL) 10 MG tablet Take 1 tablet (10 mg total)  by mouth daily.  90 tablet  1  . LORazepam (ATIVAN) 0.5 MG tablet Take 0.5 mg by mouth daily.      . mometasone-formoterol (DULERA) 200-5 MCG/ACT AERO Inhale 2 puffs into the lungs 2 (two) times daily.  13 g  11  . pantoprazole (PROTONIX) 40 MG tablet Take 1 tablet (40 mg total) by mouth daily.  30 tablet  3  . predniSONE (DELTASONE) 10 MG tablet 6 tablets daily for 4 days,  Then 4 tablets daily for 4 days,  Then 2 tablets daily for 4 days,  Then 1 tablet daily for 4 days , then 1/2 tablet daily for 4 days,  Then stop  54 tablet  0  . Red Yeast Rice 600 MG CAPS Take by mouth 2 (two) times daily.       Marland Kitchen Spacer/Aero-Holding Chambers (AEROCHAMBER PLUS) inhaler Use as instructed  1 each  2  . tizanidine (ZANAFLEX) 6 MG capsule Take 1 capsule (6 mg total) by mouth 3 (three) times daily as needed for muscle spasms.  90 capsule  1  . traMADol (ULTRAM) 50 MG tablet Take 1 tablet (50 mg total) by mouth every 8 (eight) hours as needed for pain.  90 tablet  0  . VENTOLIN HFA 108 (90 BASE) MCG/ACT inhaler INHALE ONE PUFF 4 TIMES DAILY AS NEEDED FOR DYSPNEA  18 g  11  . [DISCONTINUED] tiotropium (SPIRIVA HANDIHALER) 18 MCG inhalation capsule Place 1 capsule (18 mcg total) into inhaler and inhale daily.  30 capsule  2   No facility-administered encounter medications on file as of 02/20/2013.

## 2013-02-20 NOTE — Assessment & Plan Note (Signed)
She does not want to use Chantix.  She plans to quit on November 3.  We discussed risks and benefits of the e-cigarette today

## 2013-02-20 NOTE — Patient Instructions (Signed)
Purchase nasacort over the counter and use it two puffs each nostril daily Keep exercising regularly If your shortness of breath worsens, use the prednisone QUIT SMOKING!  We will see you back in 3 months or sooner if needed

## 2013-02-20 NOTE — Assessment & Plan Note (Signed)
Arijana continues to struggle with recurrent COPD exacerbations for one reason and one reason only: continued smoking.  We discussed this at length today.  She is not in an exacerbation right now, but she has noticed slightly increased chest tightness with sinus congestion lately.  She plans to quit smoking next week.  Plan: -quit smoking see below -continue spiriva -prednisone Rx given, advised to use if symptoms worsen, but she needs to stop smoking first! -continue [facility]

## 2013-02-21 ENCOUNTER — Other Ambulatory Visit: Payer: Self-pay | Admitting: Internal Medicine

## 2013-02-23 ENCOUNTER — Other Ambulatory Visit: Payer: Self-pay

## 2013-02-23 ENCOUNTER — Other Ambulatory Visit (INDEPENDENT_AMBULATORY_CARE_PROVIDER_SITE_OTHER): Payer: Managed Care, Other (non HMO)

## 2013-02-23 DIAGNOSIS — R079 Chest pain, unspecified: Secondary | ICD-10-CM

## 2013-02-23 DIAGNOSIS — R0602 Shortness of breath: Secondary | ICD-10-CM

## 2013-02-27 ENCOUNTER — Telehealth: Payer: Self-pay

## 2013-02-27 NOTE — Telephone Encounter (Signed)
Message copied by Marilynne Halsted on Tue Feb 27, 2013 11:53 AM ------      Message from: Lorine Bears A      Created: Mon Feb 26, 2013  8:46 AM       Inform patient that echo was normal. ------

## 2013-02-27 NOTE — Telephone Encounter (Signed)
Spoke w/ pt.  She is aware of results.  

## 2013-02-27 NOTE — Telephone Encounter (Signed)
Spoke w/ pt.  She is aware of normal echo results.  She states that her job tried to "put me out on FMLA because of my COPD.  When they found out I have a heart doctor, they tried to keep me out of work." She would like Dr. Kirke Corin to state on her paperwork that she does not need to be out of work.

## 2013-02-27 NOTE — Telephone Encounter (Signed)
Message copied by Marilynne Halsted on Tue Feb 27, 2013  4:34 PM ------      Message from: Lorine Bears A      Created: Mon Feb 26, 2013  8:46 AM       Inform patient that echo was normal. ------

## 2013-02-27 NOTE — Telephone Encounter (Signed)
FMLA papers are only filled out if time off work is needed. We can give her a note saying she she has no restrictions from a cardiac standpoint. She sill has to check with Dr. Darrick Huntsman and Dr. Army Melia to see if they want her to be off work.

## 2013-02-27 NOTE — Telephone Encounter (Signed)
Message copied by Marilynne Halsted on Tue Feb 27, 2013  1:52 PM ------      Message from: Lorine Bears A      Created: Mon Feb 26, 2013  8:46 AM       Inform patient that echo was normal. ------

## 2013-02-27 NOTE — Telephone Encounter (Signed)
Spoke w/ pt.  She states that she is on "running FMLA from Dr. Darrick Huntsman" which gives her a few weeks off a year is she is feeling sick.   She would be happy to have a note from Dr. Kirke Corin stating that she has no restrictions from a cardiac standpoint faxed to her at work at [phone].

## 2013-03-08 ENCOUNTER — Other Ambulatory Visit: Payer: Self-pay

## 2013-03-13 ENCOUNTER — Other Ambulatory Visit: Payer: Self-pay | Admitting: Internal Medicine

## 2013-03-14 ENCOUNTER — Other Ambulatory Visit: Payer: Self-pay | Admitting: Internal Medicine

## 2013-03-14 ENCOUNTER — Other Ambulatory Visit: Payer: Self-pay

## 2013-03-14 MED ORDER — TIOTROPIUM BROMIDE MONOHYDRATE 18 MCG IN CAPS: 18.0000 ug | ORAL_CAPSULE | Freq: Every day | RESPIRATORY_TRACT | Status: DC

## 2013-03-14 NOTE — Telephone Encounter (Signed)
Eprescribed.

## 2013-03-20 ENCOUNTER — Ambulatory Visit (INDEPENDENT_AMBULATORY_CARE_PROVIDER_SITE_OTHER): Payer: Managed Care, Other (non HMO) | Admitting: Internal Medicine

## 2013-03-20 ENCOUNTER — Encounter: Payer: Self-pay | Admitting: Internal Medicine

## 2013-03-20 VITALS — BP 144/90 | HR 88 | Temp 97.8°F | Resp 14 | Ht 66.0 in | Wt 143.2 lb

## 2013-03-20 DIAGNOSIS — R350 Frequency of micturition: Secondary | ICD-10-CM

## 2013-03-20 DIAGNOSIS — F411 Generalized anxiety disorder: Secondary | ICD-10-CM

## 2013-03-20 DIAGNOSIS — R0602 Shortness of breath: Secondary | ICD-10-CM

## 2013-03-20 DIAGNOSIS — J441 Chronic obstructive pulmonary disease with (acute) exacerbation: Secondary | ICD-10-CM

## 2013-03-20 DIAGNOSIS — F419 Anxiety disorder, unspecified: Secondary | ICD-10-CM

## 2013-03-20 DIAGNOSIS — I10 Essential (primary) hypertension: Secondary | ICD-10-CM

## 2013-03-20 DIAGNOSIS — N390 Urinary tract infection, site not specified: Secondary | ICD-10-CM

## 2013-03-20 LAB — POCT URINALYSIS DIPSTICK
Protein, UA: NEGATIVE
Spec Grav, UA: 1.01

## 2013-03-20 MED ORDER — DOXYCYCLINE HYCLATE 100 MG PO TABS: 100.0000 mg | ORAL_TABLET | Freq: Two times a day (BID) | ORAL | Status: DC

## 2013-03-20 MED ORDER — PREDNISONE (PAK) 10 MG PO TABS: ORAL_TABLET | ORAL | Status: DC

## 2013-03-20 MED ORDER — BUSPIRONE HCL 7.5 MG PO TABS: 7.5000 mg | ORAL_TABLET | Freq: Three times a day (TID) | ORAL | Status: DC

## 2013-03-20 MED ORDER — METHYLPREDNISOLONE ACETATE 40 MG/ML IJ SUSP
40.0000 mg | Freq: Once | INTRAMUSCULAR | Status: AC
  Administered 2013-03-20: 40 mg via INTRAMUSCULAR
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
## 2013-03-20 NOTE — Patient Instructions (Signed)
I am changing your medication for anxiety from citalopram to buspirone.  You can continue the buproprion (wellbutrin) at your current dose.   Start the buspirone tomorrow and skip your dose of citalopram tomorrow  Take the buspirone twice daily.  Take the citalpram every other day for a week,  Then stop it completely  Prednisone taper for your early COPD exacerbation  Add the doxycyline only if you develop fever or  green sputum   Try the tramadol for your back pain.    Your urine does not suggest infection,  But we will run a culture to be sure   follow up in one month about the anxiety/depression

## 2013-03-20 NOTE — Progress Notes (Signed)
Pre-visit discussion using our clinic review tool. No additional management support is needed unless otherwise documented below in the visit note.  

## 2013-03-22 ENCOUNTER — Encounter: Payer: Self-pay | Admitting: Internal Medicine

## 2013-03-22 DIAGNOSIS — R350 Frequency of micturition: Secondary | ICD-10-CM | POA: Insufficient documentation

## 2013-03-22 NOTE — Assessment & Plan Note (Signed)
Well controlled on current regimen. Renal function stable, no changes today. 

## 2013-03-22 NOTE — Assessment & Plan Note (Signed)
Adding buspirone twice daily for uncontrolled anxiety.  Return in 1 month

## 2013-03-22 NOTE — Assessment & Plan Note (Signed)
Prednisone 6 day taper.  She has no signs of bacterial infection currently. Ut willl rx doxycycline for escalation of symptoms

## 2013-03-22 NOTE — Assessment & Plan Note (Signed)
No signs of UTI or glucosuria. Recommended decreasing caffeinated beverages

## 2013-04-09 ENCOUNTER — Other Ambulatory Visit: Payer: Self-pay | Admitting: Adult Health

## 2013-04-10 ENCOUNTER — Telehealth: Payer: Self-pay | Admitting: Internal Medicine

## 2013-04-10 NOTE — Telephone Encounter (Signed)
Pt left vm.  Was rx'd new antideprssant/anxiety med, buspirone.  Gets lightheaded and feels sick on stomach when she takes it.  Would like a call with advice.

## 2013-04-11 NOTE — Telephone Encounter (Signed)
Tell her to stop it immeidately.  We can try another medication if she is willing

## 2013-04-11 NOTE — Telephone Encounter (Signed)
Has tried taking medication with an without food and  Causes dizziness and nausea either way , advised patient to wait until i sent message before trying medication again. Please advise.

## 2013-04-11 NOTE — Telephone Encounter (Signed)
Patient was advised to stop medication . Patient states she will wait she would rather not try something else unless her depression worsens. FYI

## 2013-05-01 ENCOUNTER — Other Ambulatory Visit: Payer: Self-pay | Admitting: *Deleted

## 2013-05-02 MED ORDER — PREDNISONE (PAK) 10 MG PO TABS: ORAL_TABLET | ORAL | Status: DC

## 2013-05-02 NOTE — Telephone Encounter (Signed)
Patient is requesting a refill on a prednisone taper, which is not typically done without an office visit. However if she is having a COPD exacerbation she will need it or end up in the ER ,  Needs to make an appt for next week ok to refill this one time.

## 2013-05-02 NOTE — Telephone Encounter (Signed)
Okay to refill? 

## 2013-05-04 NOTE — Telephone Encounter (Signed)
Pt states she did not request this refill, was seen at [facility] for shortness of breath and treated then.

## 2013-05-09 ENCOUNTER — Ambulatory Visit: Payer: Self-pay | Admitting: Internal Medicine

## 2013-06-05 ENCOUNTER — Emergency Department: Payer: Self-pay | Admitting: Emergency Medicine

## 2013-06-05 LAB — URINALYSIS, COMPLETE
BILIRUBIN, UR: NEGATIVE
Bacteria: NONE SEEN
Blood: NEGATIVE
Glucose,UR: NEGATIVE mg/dL (ref 0–75)
Ketone: NEGATIVE
Nitrite: NEGATIVE
Ph: 7 (ref 4.5–8.0)
Protein: NEGATIVE
RBC,UR: 1 /HPF (ref 0–5)
SPECIFIC GRAVITY: 1.008 (ref 1.003–1.030)

## 2013-06-05 LAB — COMPREHENSIVE METABOLIC PANEL
ALK PHOS: 51 U/L
ALT: 20 U/L (ref 12–78)
ANION GAP: 9 (ref 7–16)
Albumin: 4.4 g/dL (ref 3.4–5.0)
BUN: 6 mg/dL — ABNORMAL LOW (ref 7–18)
Bilirubin,Total: 0.4 mg/dL (ref 0.2–1.0)
CO2: 26 mmol/L (ref 21–32)
Calcium, Total: 9.8 mg/dL (ref 8.5–10.1)
Chloride: 100 mmol/L (ref 98–107)
Creatinine: 0.84 mg/dL (ref 0.60–1.30)
Glucose: 110 mg/dL — ABNORMAL HIGH (ref 65–99)
Osmolality: 268 (ref 275–301)
Potassium: 4 mmol/L (ref 3.5–5.1)
SGOT(AST): 13 U/L — ABNORMAL LOW (ref 15–37)
SODIUM: 135 mmol/L — AB (ref 136–145)
Total Protein: 7.6 g/dL (ref 6.4–8.2)

## 2013-06-05 LAB — CBC WITH DIFFERENTIAL/PLATELET
BASOS ABS: 0 10*3/uL (ref 0.0–0.1)
BASOS PCT: 0.3 %
Eosinophil #: 0.1 10*3/uL (ref 0.0–0.7)
Eosinophil %: 1.1 %
HCT: 43.8 % (ref 35.0–47.0)
HGB: 14.9 g/dL (ref 12.0–16.0)
Lymphocyte #: 1.7 10*3/uL (ref 1.0–3.6)
Lymphocyte %: 14.7 %
MCH: 31.5 pg (ref 26.0–34.0)
MCHC: 34.1 g/dL (ref 32.0–36.0)
MCV: 92 fL (ref 80–100)
MONO ABS: 0.6 x10 3/mm (ref 0.2–0.9)
Monocyte %: 5.3 %
NEUTROS ABS: 8.9 10*3/uL — AB (ref 1.4–6.5)
NEUTROS PCT: 78.6 %
Platelet: 302 10*3/uL (ref 150–440)
RBC: 4.74 10*6/uL (ref 3.80–5.20)
RDW: 13.7 % (ref 11.5–14.5)
WBC: 11.4 10*3/uL — AB (ref 3.6–11.0)

## 2013-06-05 LAB — LIPASE, BLOOD: Lipase: 86 U/L (ref 73–393)

## 2013-06-06 ENCOUNTER — Ambulatory Visit: Payer: Managed Care, Other (non HMO) | Admitting: Pulmonary Disease

## 2013-06-07 ENCOUNTER — Ambulatory Visit (INDEPENDENT_AMBULATORY_CARE_PROVIDER_SITE_OTHER): Payer: Managed Care, Other (non HMO) | Admitting: Pulmonary Disease

## 2013-06-07 ENCOUNTER — Encounter: Payer: Self-pay | Admitting: Pulmonary Disease

## 2013-06-07 VITALS — BP 114/70 | HR 86 | Ht 66.0 in | Wt 133.0 lb

## 2013-06-07 DIAGNOSIS — J4489 Other specified chronic obstructive pulmonary disease: Secondary | ICD-10-CM

## 2013-06-07 DIAGNOSIS — F419 Anxiety disorder, unspecified: Secondary | ICD-10-CM

## 2013-06-07 DIAGNOSIS — F411 Generalized anxiety disorder: Secondary | ICD-10-CM

## 2013-06-07 DIAGNOSIS — J449 Chronic obstructive pulmonary disease, unspecified: Secondary | ICD-10-CM

## 2013-06-07 DIAGNOSIS — Z720 Tobacco use: Secondary | ICD-10-CM

## 2013-06-07 DIAGNOSIS — F17200 Nicotine dependence, unspecified, uncomplicated: Secondary | ICD-10-CM

## 2013-06-07 MED ORDER — ALBUTEROL SULFATE HFA 108 (90 BASE) MCG/ACT IN AERS: 2.0000 | INHALATION_SPRAY | Freq: Four times a day (QID) | RESPIRATORY_TRACT | Status: DC | PRN

## 2013-06-07 NOTE — Assessment & Plan Note (Signed)
Today her simple spirometry is worse from a year ago. Her FEV1 has dropped to less than 1 L. This is due to ongoing tobacco use. We have checked for evidence of an allergy with an IgE level and blood work in the last few months and that was negative. There is nothing new in her environment. She understands that her ongoing tobacco use is killing her lungs.  Plan: -Quit smoking quit smoking quit smoking -Continue Spiriva and [employer]

## 2013-06-07 NOTE — Patient Instructions (Signed)
Keep taking the Spiriva and [facility] as you are doing Quit smoking, quit smoking, quit smoking Try relaxing when you get short of breath first as we discussed, if this doesn't work, then use the albuterol  We will see you back in 3 months or sooner if needed

## 2013-06-07 NOTE — Progress Notes (Addendum)
Subjective:    Patient ID: Diane [name], female    DOB: 12-22-1955, 58 y.o.   MRN: [id number]   Synopsis: Mrs. Diane [name] first saw the [facility] in July 2014 for COPD. She has had multiple hospitalizations and doctor visits for exacerbations of COPD. As of October of 2014 she continues to smoke cigarettes. A 2014 FEV1 was 55% predicted.    HPI   02/20/2013 ROV > [name] says that she is doing better since the last visit.  She feels that the Spiriva is better since the last visit.  One month ago she was given prednisone by Dr. Darrick Huntsmanullo and she was given [facility].  She feels that she is doing better.  She still hsa periods of dyspnea.  Typically later in the day she will feel chest tightness.  This has increased in the last two weeks.  She continues to smoke, but only one cigarette in the last three days.  She has been walking more lately, 15 minutes a day.    Cough has picked up in the last few days.  She has dry cough.  She has post nasal drip and sinus congestion.    06/07/2013 ROV >> [name] has been having a bad month with a lot of anxiety, nausea, and burning in her stomach. She is to see gastroenterology for this.  She apparently went out to see another physician in the area a few weeks ago and was started on three antibiotics for a stomach problem.  She has a lot of shortness of breath with the anxiety.  She isn't coughing more than usual.  She [facility] and Spiriva.  She had been using the albuterol at least four times a da  Past Medical History  Diagnosis Date  . COPD (chronic obstructive pulmonary disease)   . Hyperlipidemia   . Tobacco abuse   . Depression   . Anxiety   . Hypertension      Review of Systems  Constitutional: Negative for fever, activity change and fatigue.  HENT: Positive for congestion, postnasal drip, rhinorrhea and sinus pressure.   Respiratory: Positive for shortness of breath. Negative for cough and wheezing.    Cardiovascular: Negative for chest pain, palpitations and leg swelling.       Objective:   Physical Exam   Filed Vitals:   06/07/13 1342  BP: 114/70  Pulse: 86  Height: 5\' 6"  (1.676 m)  Weight: 133 lb (60.328 kg)  SpO2: 95%  RA  Gen: no acute distress HEENT: NCAT, EOMi PULM: CTA B, no wheezing today, prolonged expiratory phase CV: RRR, no mgr AB: BS+, soft, nontender Ext: warm, no edema  [facility] no pulmonary embolism, there is moderate to severe centrilobular emphysema in the upper lobes right greater than left bilaterally, no other market pulmonary abnormality March 2014 [facility] ratio 43%, FEV1 1.42 L (55% predicted), TLC 5.74 L (108% predicted), residual volume 2.46 L (125% predicted), DLCO 52% predicted, FEV1 improved more than 12% with a bronchodilator     Assessment & Plan:   COPD (chronic obstructive pulmonary disease) Today her simple spirometry is worse from a year ago. Her FEV1 has dropped to less than 1 L. This is due to ongoing tobacco use. We have checked for evidence of an allergy with an IgE level and blood work in the last few months and that was negative. There is nothing new in  her environment. She understands that her ongoing tobacco use is killing her lungs.  Plan: -Quit smoking quit smoking quit smoking -Continue Spiriva and Dulera  Tobacco abuse This is ultimately the problem. She needs to quit desperately. She is still not quite ready to quit from an emotional standpoint.  Anxiety This seems to be a problem which is limiting her significantly. I question whether or not there is anxiety provoked vocal cord dysfunction which is contributing to her shortness of breath. In fact, I think she is probably overusing her albuterol as this makes her more jittery.  I advised her today to try to use relaxation techniques and deep breathing and  pursed lip breathing to calm herself down when she gets short of breath with anxiety.   She will followup with her primary care physician regarding further treatment options for her anxiety.    Updated Medication List Outpatient Encounter Prescriptions as of 06/07/2013  Medication Sig  . albuterol (PROVENTIL) (2.5 MG/3ML) 0.083% nebulizer solution USE ONE VIAL IN NEBULIZER EVERY 6 HOURS AS NEEDED FOR WHEEZING  . chlorpheniramine-HYDROcodone (TUSSIONEX) 10-8 MG/5ML LQCR Take 10 mLs by mouth every 12 (twelve) hours as needed.  . citalopram (CELEXA) 20 MG tablet TAKE ONE TABLET BY MOUTH ONCE DAILY  . Eszopiclone 3 MG TABS Take 1 tablet (3 mg total) by mouth at bedtime. Take immediately before bedtime  . lisinopril (PRINIVIL,ZESTRIL) 10 MG tablet TAKE ONE TABLET BY MOUTH ONCE DAILY  . mometasone-formoterol (DULERA) 200-5 MCG/ACT AERO Inhale 2 puffs into the lungs 2 (two) times daily.  . ondansetron (ZOFRAN) 4 MG tablet Take 4 mg by mouth 3 (three) times daily as needed for nausea or vomiting.  . pantoprazole (PROTONIX) 40 MG tablet Take 1 tablet (40 mg total) by mouth daily.  . ranitidine (ZANTAC) 150 MG capsule Take 150 mg by mouth 2 (two) times daily.  . Red Yeast Rice 600 MG CAPS Take by mouth 2 (two) times daily.   Marland Kitchen Spacer/Aero-Holding Chambers (AEROCHAMBER PLUS) inhaler Use as instructed  . tiotropium (SPIRIVA) 18 MCG inhalation capsule Place 1 capsule (18 mcg total) into inhaler and inhale daily.  . traMADol (ULTRAM) 50 MG tablet Take 1 tablet (50 mg total) by mouth every 8 (eight) hours as needed for pain.  . VENTOLIN HFA 108 (90 BASE) MCG/ACT inhaler INHALE ONE PUFF 4 TIMES DAILY AS NEEDED FOR DYSPNEA  . tizanidine (ZANAFLEX) 6 MG capsule Take 1 capsule (6 mg total) by mouth 3 (three) times daily as needed for muscle spasms.  . [DISCONTINUED] buPROPion (WELLBUTRIN) 75 MG tablet Take 1 tablet (75 mg total) by mouth 2 (two) times daily.  . [DISCONTINUED] busPIRone (BUSPAR) 7.5 MG tablet  Take 1 tablet (7.5 mg total) by mouth 3 (three) times daily. Take 1 tablet by mouth 2 times daily,  Add a 3rd tablet if needed  . [DISCONTINUED] doxycycline (VIBRA-TABS) 100 MG tablet Take 1 tablet (100 mg total) by mouth 2 (two) times daily.  . [DISCONTINUED] LORazepam (ATIVAN) 0.5 MG tablet Take 0.5 mg by mouth daily.  . [DISCONTINUED] predniSONE (STERAPRED UNI-PAK) 10 MG tablet 6 tablets on Day 1 , then reduce by 1 tablet daily until gone

## 2013-06-07 NOTE — Assessment & Plan Note (Signed)
This is ultimately the problem. She needs to quit desperately. She is still not quite ready to quit from an emotional standpoint.

## 2013-06-07 NOTE — Assessment & Plan Note (Addendum)
This seems to be a problem which is limiting her significantly. I question whether or not there is anxiety provoked vocal cord dysfunction which is contributing to her shortness of breath. In fact, I think she is probably overusing her albuterol as this makes her more jittery.  I advised her today to try to use relaxation techniques and deep breathing and pursed lip breathing to calm herself down when she gets short of breath with anxiety.   She will followup with her primary care physician regarding further treatment options for her anxiety.

## 2013-06-08 ENCOUNTER — Telehealth: Payer: Self-pay | Admitting: Internal Medicine

## 2013-06-08 NOTE — Telephone Encounter (Signed)
That's fine

## 2013-06-08 NOTE — Telephone Encounter (Signed)
The patient transferred to Dr. Judithann GravesBerglund . Now she wants to come back to dr. Darrick Huntsmanullo ,because she is not please with Dr. Judithann GravesBerglund. Please advise.

## 2013-06-12 ENCOUNTER — Inpatient Hospital Stay: Payer: Self-pay | Admitting: Internal Medicine

## 2013-06-12 LAB — CBC
HCT: 39 % (ref 35.0–47.0)
HGB: 13.5 g/dL (ref 12.0–16.0)
MCH: 31.8 pg (ref 26.0–34.0)
MCHC: 34.6 g/dL (ref 32.0–36.0)
MCV: 92 fL (ref 80–100)
Platelet: 246 10*3/uL (ref 150–440)
RBC: 4.25 10*6/uL (ref 3.80–5.20)
RDW: 13.4 % (ref 11.5–14.5)
WBC: 6.7 10*3/uL (ref 3.6–11.0)

## 2013-06-12 LAB — BASIC METABOLIC PANEL
ANION GAP: 6 — AB (ref 7–16)
BUN: 6 mg/dL — ABNORMAL LOW (ref 7–18)
CALCIUM: 9.7 mg/dL (ref 8.5–10.1)
Chloride: 103 mmol/L (ref 98–107)
Co2: 26 mmol/L (ref 21–32)
Creatinine: 0.71 mg/dL (ref 0.60–1.30)
EGFR (African American): >60
EGFR (Non-African Amer.): >60
Glucose: 104 mg/dL — ABNORMAL HIGH (ref 65–99)
OSMOLALITY: 268 (ref 275–301)
POTASSIUM: 3.5 mmol/L (ref 3.5–5.1)
SODIUM: 135 mmol/L — AB (ref 136–145)

## 2013-06-12 LAB — TROPONIN I: Troponin-I: <0.02 ng/mL

## 2013-06-12 NOTE — Telephone Encounter (Signed)
The patient has been scheduled

## 2013-06-17 ENCOUNTER — Other Ambulatory Visit: Payer: Self-pay | Admitting: Internal Medicine

## 2013-06-26 ENCOUNTER — Ambulatory Visit: Payer: Managed Care, Other (non HMO) | Admitting: Internal Medicine

## 2013-06-28 ENCOUNTER — Ambulatory Visit: Payer: Managed Care, Other (non HMO) | Admitting: Internal Medicine

## 2013-06-29 ENCOUNTER — Other Ambulatory Visit: Payer: Self-pay | Admitting: Internal Medicine

## 2013-06-29 MED ORDER — LISINOPRIL 10 MG PO TABS: 10.0000 mg | ORAL_TABLET | Freq: Every day | ORAL | Status: DC

## 2013-06-29 NOTE — Telephone Encounter (Signed)
Patient was very upset and stated that she was only given 15 pills from the hospital and patient has an appointment scheduled for 07/09/13

## 2013-06-29 NOTE — Telephone Encounter (Signed)
I can refill lisinopril but the Clonazepam not on list please advise as to refill.

## 2013-06-29 NOTE — Telephone Encounter (Signed)
Per discharge summary from feb 12th,  She was prescribed  clonazepam for prn bedtime.  If she is already out,   she is overusing it.  Controlled substance , cannot refill without being seen

## 2013-06-29 NOTE — Telephone Encounter (Signed)
The Clonazepam was given to the patient while she was in the hospital 2.10 -2.12.15  Clonazepam 0.5 mg  Take as needed for anxiety and Shortness of Breath  lisinopril (PRINIVIL,ZESTRIL) 10 MG tablet

## 2013-07-01 NOTE — Telephone Encounter (Signed)
Then it can be refilled #30 no refills

## 2013-07-09 ENCOUNTER — Encounter: Payer: Self-pay | Admitting: Internal Medicine

## 2013-07-09 ENCOUNTER — Ambulatory Visit (INDEPENDENT_AMBULATORY_CARE_PROVIDER_SITE_OTHER): Payer: Managed Care, Other (non HMO) | Admitting: Internal Medicine

## 2013-07-09 VITALS — BP 128/72 | HR 78 | Temp 98.1°F | Resp 16 | Wt 130.2 lb

## 2013-07-09 DIAGNOSIS — Z720 Tobacco use: Secondary | ICD-10-CM

## 2013-07-09 DIAGNOSIS — F419 Anxiety disorder, unspecified: Secondary | ICD-10-CM

## 2013-07-09 DIAGNOSIS — Z8709 Personal history of other diseases of the respiratory system: Secondary | ICD-10-CM

## 2013-07-09 DIAGNOSIS — J449 Chronic obstructive pulmonary disease, unspecified: Secondary | ICD-10-CM

## 2013-07-09 DIAGNOSIS — F411 Generalized anxiety disorder: Secondary | ICD-10-CM

## 2013-07-09 DIAGNOSIS — F17200 Nicotine dependence, unspecified, uncomplicated: Secondary | ICD-10-CM

## 2013-07-09 MED ORDER — CLONAZEPAM 0.5 MG PO TABS: 0.5000 mg | ORAL_TABLET | Freq: Two times a day (BID) | ORAL | Status: DC | PRN

## 2013-07-09 MED ORDER — CITALOPRAM HYDROBROMIDE 20 MG PO TABS: ORAL_TABLET | ORAL | Status: DC

## 2013-07-09 MED ORDER — ESZOPICLONE 3 MG PO TABS: 3.0000 mg | ORAL_TABLET | Freq: Every day | ORAL | Status: DC

## 2013-07-09 NOTE — Patient Instructions (Addendum)
I am increasing the citalopram from 20 mg daily gradually to 40 mg daily over time.  Take 1.5  Tablets daily for a week,  Then increase to 2 tablets daily  If you tolerate this dose,  Call before refilling so I can change the pill to 40 mg tablet.   continue clonazepam twice daily as needed for anxeity  Return in one month   We will initiate the psychiatry referral for you
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
## 2013-07-09 NOTE — Progress Notes (Signed)
Pre-visit discussion using our clinic review tool. No additional management support is needed unless otherwise documented below in the visit note.  

## 2013-07-10 ENCOUNTER — Telehealth: Payer: Self-pay | Admitting: Internal Medicine

## 2013-07-10 NOTE — Assessment & Plan Note (Signed)
With recent brief admission, secondary to COPD exacerbation.  FMLA done due to frequent admissions and exacerbations, complicated by ongoing tobacco abuse and anxiety .  currently asympomatic at rest.  No changes to meds today

## 2013-07-10 NOTE — Telephone Encounter (Signed)
Relevant patient education mailed to patient.  

## 2013-07-10 NOTE — Assessment & Plan Note (Signed)
Strong advisory counseling given again.  Recurrent advice,  can return for assistance with [facility] if they decide to accept pharmacotherapy.

## 2013-07-10 NOTE — Assessment & Plan Note (Signed)
Aggravated by moderate to severe COPD.  Will increase citalopram to 40 mg daily ,  Continue low dfose clonazepam bid prn,  Referral to psychiatry made.

## 2013-07-10 NOTE — Assessment & Plan Note (Signed)
She is on optimal therapy and under the care of Dr. Kendrick FriesMcQuaid for severe emphysema but continues to smoke. No changes to meds today

## 2013-07-12 ENCOUNTER — Telehealth: Payer: Self-pay | Admitting: Internal Medicine

## 2013-07-12 NOTE — Telephone Encounter (Signed)
That's fine placed up front.

## 2013-07-12 NOTE — Telephone Encounter (Signed)
Pt father will be picking up FMLA paperwork for pt on 3/13.  Advised for him to bring ID.

## 2013-07-16 ENCOUNTER — Telehealth: Payer: Self-pay | Admitting: Internal Medicine

## 2013-07-16 ENCOUNTER — Encounter: Payer: Self-pay | Admitting: Internal Medicine

## 2013-07-16 ENCOUNTER — Ambulatory Visit (INDEPENDENT_AMBULATORY_CARE_PROVIDER_SITE_OTHER): Payer: Managed Care, Other (non HMO) | Admitting: Internal Medicine

## 2013-07-16 VITALS — BP 118/80 | HR 102 | Temp 97.4°F | Resp 20 | Wt 124.5 lb

## 2013-07-16 DIAGNOSIS — R062 Wheezing: Secondary | ICD-10-CM

## 2013-07-16 DIAGNOSIS — J441 Chronic obstructive pulmonary disease with (acute) exacerbation: Secondary | ICD-10-CM

## 2013-07-16 MED ORDER — METHYLPREDNISOLONE ACETATE 40 MG/ML IJ SUSP
40.0000 mg | Freq: Once | INTRAMUSCULAR | Status: AC
  Administered 2013-07-16: 40 mg via INTRAMUSCULAR

## 2013-07-16 MED ORDER — PREDNISONE 20 MG PO TABS: 60.0000 mg | ORAL_TABLET | Freq: Two times a day (BID) | ORAL | Status: DC

## 2013-07-16 MED ORDER — PREDNISONE (PAK) 10 MG PO TABS: ORAL_TABLET | ORAL | Status: DC

## 2013-07-16 MED ORDER — ALBUTEROL SULFATE (2.5 MG/3ML) 0.083% IN NEBU
2.5000 mg | INHALATION_SOLUTION | Freq: Once | RESPIRATORY_TRACT | Status: AC
  Administered 2013-07-16: 2.5 mg via RESPIRATORY_TRACT

## 2013-07-16 MED ORDER — HYDROCOD POLST-CHLORPHEN POLST 10-8 MG/5ML PO LQCR: 10.0000 mL | Freq: Two times a day (BID) | ORAL | Status: DC | PRN

## 2013-07-16 MED ORDER — DOXYCYCLINE HYCLATE 100 MG PO CAPS: 100.0000 mg | ORAL_CAPSULE | Freq: Two times a day (BID) | ORAL | Status: DC

## 2013-07-16 NOTE — Patient Instructions (Signed)
You are having a COPD exacerbation  If you become more short of breath than you are now,  You need to go to the nearest ER immediately or call 911  I will try to treat you at home with the following:  Prednisone:  60 mg twice daily for 3 days,  Then start the once daily steroid taper by 1 tablet daily until gone Doxycycline 100 mg twice daily with food  for 7 days  Continue your spiriva daily .  And the symbicort 2 puffs twice daily  Use your albuterol nebulizer as needed  T DO THE BREATHING EXERCISE I TAUGHT YOU TO GET RID OF RETAINED AIR IN YOUR LUNGS  Return on Wednesday  11:15 appt so I can clear you to return to work

## 2013-07-16 NOTE — Progress Notes (Signed)
Pre-visit discussion using our clinic review tool. No additional management support is needed unless otherwise documented below in the visit note.  
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
## 2013-07-16 NOTE — Telephone Encounter (Signed)
Patient Information:  Caller Name: [name]  Phone: [phone]  Patient: Diane [name], Diane [name]  Gender: Female  DOB: [birth year]  Age: [age] Years  PCP: Duncan Dullullo, Teresa (Adults only)  Office Follow Up:  Does the office need to follow up with this patient?: No  Instructions For The Office: [name]   Symptoms  Reason For Call & Symptoms: c/o cough with SOB; h/o COPD; using nebs; has [name] random rattling in the chest after coughing; phlegm is foamy and brownish; sats 92-93%  Reviewed Health History In EMR: Yes  Reviewed Medications In EMR: Yes  Reviewed Allergies In EMR: Yes  Reviewed Surgeries / Procedures: Yes  Date of Onset of Symptoms: 07/13/2013  Treatments Tried: Ventolin  Treatments Tried Worked: Yes  Guideline(s) Used:  Asthma Attack  Disposition Per Guideline:   See Today or Tomorrow in Office  Reason For Disposition Reached:   Mild asthma attack (e.g., no SOB at rest, mild SOB with walking, speaks normally in sentences, mild wheezing) and persists > 24 hours on appropriate treatment  Advice Given:  [name]  Patient Will Follow Care Advice:  YES  Appointment Scheduled:  07/16/2013 18:15:00 Appointment Scheduled Provider:  Duncan Dullullo, Teresa (Adults only)

## 2013-07-16 NOTE — Telephone Encounter (Signed)
Patient scheduled Acute today at 6.15 using nebs and emergency inhaler as directed. FYI

## 2013-07-16 NOTE — Progress Notes (Signed)
Patient came in with prominent inspiratory wheezing O2 sat of 92% and could not speak without becoming SOB. Patient started albuterol and depo medrol. Wheezing improved with treatment but still inspiratory wheezing noted in left upper lobe.

## 2013-07-17 ENCOUNTER — Encounter: Payer: Self-pay | Admitting: Internal Medicine

## 2013-07-17 NOTE — Assessment & Plan Note (Signed)
Improved aeration with double nebulizer treatments.  Prednisone bid x 3 days followed by 6 day taper.  She has no signs of bacterial infection currently, but will Rx doxycycline for escalation of symptoms.  Work note written for 3 day absence.  Smoking cessation advised.

## 2013-07-18 ENCOUNTER — Ambulatory Visit (INDEPENDENT_AMBULATORY_CARE_PROVIDER_SITE_OTHER): Payer: Managed Care, Other (non HMO) | Admitting: Internal Medicine

## 2013-07-18 ENCOUNTER — Encounter: Payer: Self-pay | Admitting: Internal Medicine

## 2013-07-18 VITALS — BP 120/82 | HR 90 | Temp 97.8°F | Resp 18 | Wt 124.5 lb

## 2013-07-18 DIAGNOSIS — J441 Chronic obstructive pulmonary disease with (acute) exacerbation: Secondary | ICD-10-CM

## 2013-07-18 MED ORDER — BENZONATATE 200 MG PO CAPS: 200.0000 mg | ORAL_CAPSULE | Freq: Three times a day (TID) | ORAL | Status: DC | PRN
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
## 2013-07-18 NOTE — Progress Notes (Signed)
Pre-visit discussion using our clinic review tool. No additional management support is needed unless otherwise documented below in the visit note.  

## 2013-07-19 ENCOUNTER — Telehealth: Payer: Self-pay | Admitting: Internal Medicine

## 2013-07-19 NOTE — Telephone Encounter (Signed)
Relevant patient education assigned to patient using Emmi. ° °

## 2013-07-21 NOTE — Assessment & Plan Note (Signed)
Lung exam has improved but she remains very dyspneic with minimal exertion, and is not ready to return to work. Will recoomend a few more days of rest.

## 2013-08-10 ENCOUNTER — Encounter: Payer: Self-pay | Admitting: Internal Medicine

## 2013-08-10 ENCOUNTER — Ambulatory Visit (INDEPENDENT_AMBULATORY_CARE_PROVIDER_SITE_OTHER): Payer: Managed Care, Other (non HMO) | Admitting: Internal Medicine

## 2013-08-10 VITALS — BP 104/78 | HR 86 | Temp 98.2°F | Resp 18 | Wt 129.5 lb

## 2013-08-10 DIAGNOSIS — F411 Generalized anxiety disorder: Secondary | ICD-10-CM

## 2013-08-10 DIAGNOSIS — J441 Chronic obstructive pulmonary disease with (acute) exacerbation: Secondary | ICD-10-CM

## 2013-08-10 DIAGNOSIS — F419 Anxiety disorder, unspecified: Secondary | ICD-10-CM

## 2013-08-10 MED ORDER — PREDNISONE (PAK) 10 MG PO TABS: ORAL_TABLET | ORAL | Status: DC

## 2013-08-10 MED ORDER — ALBUTEROL SULFATE (2.5 MG/3ML) 0.083% IN NEBU: INHALATION_SOLUTION | RESPIRATORY_TRACT | Status: DC

## 2013-08-10 MED ORDER — AZELASTINE-FLUTICASONE 137-50 MCG/ACT NA SUSP: 2.0000 | Freq: Every day | NASAL | Status: DC

## 2013-08-10 MED ORDER — FEXOFENADINE HCL 180 MG PO TABS: 180.0000 mg | ORAL_TABLET | Freq: Every day | ORAL | Status: DC
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
## 2013-08-10 NOTE — Patient Instructions (Addendum)
Adding dymista nasal spray  2 squirts each nostril daily ,,    You can add allegra 180 mg daily if allergies  (fexofenadine)

## 2013-08-10 NOTE — Progress Notes (Signed)
Pre-visit discussion using our clinic review tool. No additional management support is needed unless otherwise documented below in the visit note.  

## 2013-08-12 NOTE — Assessment & Plan Note (Addendum)
Last episode resolved but currently appears to be having another  Early exacerbation due to pollen. Steroid taper, inhalers,  dymista and allegra.

## 2013-08-12 NOTE — Assessment & Plan Note (Addendum)
Symptoms have been managed with daily SSRI therapy, medication for insomnia, and frequent use of anxiolytics.  No changes today

## 2013-08-27 ENCOUNTER — Encounter: Payer: Self-pay | Admitting: Adult Health

## 2013-08-27 ENCOUNTER — Ambulatory Visit (INDEPENDENT_AMBULATORY_CARE_PROVIDER_SITE_OTHER): Payer: Managed Care, Other (non HMO) | Admitting: Adult Health

## 2013-08-27 VITALS — BP 102/74 | HR 89 | Temp 98.0°F | Resp 16 | Wt 128.0 lb

## 2013-08-27 DIAGNOSIS — J441 Chronic obstructive pulmonary disease with (acute) exacerbation: Secondary | ICD-10-CM

## 2013-08-27 MED ORDER — HYDROCOD POLST-CHLORPHEN POLST 10-8 MG/5ML PO LQCR: 10.0000 mL | Freq: Two times a day (BID) | ORAL | Status: DC | PRN

## 2013-08-27 MED ORDER — DOXYCYCLINE HYCLATE 100 MG PO CAPS: 100.0000 mg | ORAL_CAPSULE | Freq: Two times a day (BID) | ORAL | Status: DC

## 2013-08-27 MED ORDER — PREDNISONE 10 MG PO TABS: ORAL_TABLET | ORAL | Status: DC

## 2013-08-27 NOTE — Progress Notes (Signed)
Pre visit review using our clinic review tool, if applicable. No additional management support is needed unless otherwise documented below in the visit note. 

## 2013-08-27 NOTE — Progress Notes (Signed)
Subjective:    Patient ID: Diane KeysPeggy A Kellogg, female    DOB: 03-Jun-1955, 58 y.o.   MRN: [id number]  HPI Patient is a 58 year old female with history of COPD, ongoing tobacco abuse who presents to clinic with dry cough, wheezing. She reports her symptoms began after she returned from vacation in [place]. She has been using her nebulizer approximately every 6 hours since about Friday. She reports taking all medications as prescribed.  Past Medical History  Diagnosis Date  . COPD (chronic obstructive pulmonary disease)   . Hyperlipidemia   . Tobacco abuse   . Depression   . Anxiety   . Hypertension    Current Outpatient Prescriptions on File Prior to Visit  Medication Sig Dispense Refill  . albuterol (PROVENTIL) (2.5 MG/3ML) 0.083% nebulizer solution USE ONE VIAL IN NEBULIZER EVERY 6 HOURS AS NEEDED FOR WHEEZING  180 mL  3  . albuterol (VENTOLIN HFA) 108 (90 BASE) MCG/ACT inhaler Inhale 2 puffs into the lungs 4 (four) times daily as needed for wheezing.  1 Inhaler  3  . Azelastine-Fluticasone (DYMISTA) 137-50 MCG/ACT SUSP Place 2 Squirts into the nose daily. In each nostril  23 g  11  . benzonatate (TESSALON) 200 MG capsule Take 1 capsule (200 mg total) by mouth 3 (three) times daily as needed for cough.  60 capsule  1  . citalopram (CELEXA) 20 MG tablet 1.5 pills daily for one week,  Then 2 pills daily  90 tablet  0  . clonazePAM (KLONOPIN) 0.5 MG tablet Take 1 tablet (0.5 mg total) by mouth 2 (two) times daily as needed for anxiety.  60 tablet  2  . Eszopiclone 3 MG TABS Take 1 tablet (3 mg total) by mouth at bedtime. Take immediately before bedtime  30 tablet  4  . fexofenadine (ALLEGRA) 180 MG tablet Take 1 tablet (180 mg total) by mouth daily.  90 tablet  1  . lisinopril (PRINIVIL,ZESTRIL) 10 MG tablet Take 1 tablet (10 mg total) by mouth daily.  90 tablet  2  . mometasone-formoterol (DULERA) 200-5 MCG/ACT AERO Inhale 2 puffs into the lungs 2 (two) times daily.  13 g  11  . ondansetron  (ZOFRAN) 4 MG tablet Take 4 mg by mouth 3 (three) times daily as needed for nausea or vomiting.      . pantoprazole (PROTONIX) 40 MG tablet Take 1 tablet (40 mg total) by mouth daily.  30 tablet  3  . ranitidine (ZANTAC) 150 MG capsule Take 150 mg by mouth 2 (two) times daily.      . Red Yeast Rice 600 MG CAPS Take by mouth 2 (two) times daily.       Marland Kitchen. Spacer/Aero-Holding Chambers (AEROCHAMBER PLUS) inhaler Use as instructed  1 each  2  . tiotropium (SPIRIVA) 18 MCG inhalation capsule Place 1 capsule (18 mcg total) into inhaler and inhale daily.  30 capsule  6  . tizanidine (ZANAFLEX) 6 MG capsule Take 1 capsule (6 mg total) by mouth 3 (three) times daily as needed for muscle spasms.  90 capsule  1  . traMADol (ULTRAM) 50 MG tablet Take 1 tablet (50 mg total) by mouth every 8 (eight) hours as needed for pain.  90 tablet  0  . VENTOLIN HFA 108 (90 BASE) MCG/ACT inhaler INHALE ONE PUFF 4 TIMES DAILY AS NEEDED FOR DYSPNEA  18 each  0   No current facility-administered medications on file prior to visit.     Review of Systems  Constitutional:  Negative.   HENT: Negative.   Eyes: Negative.   Respiratory: Positive for cough, chest tightness and wheezing. Negative for shortness of breath.   Cardiovascular: Negative.  Negative for chest pain.  Gastrointestinal: Negative.   Endocrine: Negative.   Genitourinary: Negative.   Musculoskeletal: Negative.   Skin: Negative.   Allergic/Immunologic: Negative.   Neurological: Negative.   Hematological: Negative.   Psychiatric/Behavioral: Negative.        Objective:   Physical Exam  Constitutional: She is oriented to person, place, and time. No distress.  HENT:  Head: Normocephalic and atraumatic.  Eyes: Conjunctivae and EOM are normal.  Neck: Normal range of motion. Neck supple.  Cardiovascular: Normal rate, regular rhythm, normal heart sounds and intact distal pulses.  Exam reveals no gallop and no friction rub.   No murmur  heard. Pulmonary/Chest: Effort normal. No respiratory distress. She has wheezes. She has no rales.  Decreased air movement posterior bilateral bases  Musculoskeletal: Normal range of motion.  Neurological: She is alert and oriented to person, place, and time. She has normal reflexes. Coordination normal.  Skin: Skin is warm and dry.  Psychiatric: She has a normal mood and affect. Her behavior is normal. Judgment and thought content normal.      Assessment & Plan:   1. COPD exacerbation Prednisone taper Doxycycline Continue inhaler, nebulizer as prescribed Tussionex for cough Call if no improvement of symptoms within 3-4 days or sooner if necessary

## 2013-08-27 NOTE — Patient Instructions (Addendum)
  Also begin a prednisone taper as follows:   Day one-6 tablets  Day 2 - 5 1/2 tablets  Day 3 - 5 tablets  Day 4 - 4 1/2 tablets  Day 5 - 4 tablets  Day 6 - 3 1/2 tablets  Day 7 - 3 tablets  Day 8 - 2 1/2 tablets  Day 9 - 2 tablets  Day 10 - 1 1/2 tablets  Day 11 - 1 tablet  Day 12 - 1/2 tablet and complete  Start doxycycline 100 mg twice daily for 7 days  Tussionex for your cough

## 2013-09-05 ENCOUNTER — Telehealth: Payer: Self-pay | Admitting: *Deleted

## 2013-09-05 MED ORDER — TIOTROPIUM BROMIDE MONOHYDRATE 2.5 MCG/ACT IN AERS: 2.0000 | INHALATION_SPRAY | Freq: Every day | RESPIRATORY_TRACT | Status: DC

## 2013-09-05 NOTE — Telephone Encounter (Signed)
Patient called and says she was given spiriva respimat inhaler as a sample amd feels this works better than the capsule and would like a script for this medication please advise?

## 2013-09-05 NOTE — Telephone Encounter (Signed)
respimat inhaler sent to [facility]

## 2013-09-06 NOTE — Telephone Encounter (Signed)
Patient notified

## 2013-09-25 ENCOUNTER — Ambulatory Visit (INDEPENDENT_AMBULATORY_CARE_PROVIDER_SITE_OTHER): Payer: Managed Care, Other (non HMO) | Admitting: Internal Medicine

## 2013-09-25 ENCOUNTER — Encounter: Payer: Self-pay | Admitting: Internal Medicine

## 2013-09-25 VITALS — BP 120/70 | HR 107 | Temp 98.3°F | Resp 16 | Wt 129.0 lb

## 2013-09-25 DIAGNOSIS — J441 Chronic obstructive pulmonary disease with (acute) exacerbation: Secondary | ICD-10-CM

## 2013-09-25 MED ORDER — PREDNISONE 10 MG PO TABS: ORAL_TABLET | ORAL | Status: DC

## 2013-09-25 MED ORDER — GUAIFENESIN-CODEINE 100-10 MG/5ML PO SYRP: 5.0000 mL | ORAL_SOLUTION | Freq: Three times a day (TID) | ORAL | Status: DC | PRN

## 2013-09-25 MED ORDER — DOXYCYCLINE HYCLATE 100 MG PO TABS: 100.0000 mg | ORAL_TABLET | Freq: Two times a day (BID) | ORAL | Status: AC

## 2013-09-25 NOTE — Progress Notes (Signed)
Pre visit review using our clinic review tool, if applicable. No additional management support is needed unless otherwise documented below in the visit note. 
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
## 2013-09-25 NOTE — Patient Instructions (Signed)
You are having a COPD exacerbation  If you become more short of breath than you are now,  You need to go to the nearest ER immediately or call 911  I will try to treat you at home with the following:  Prednisone:  60 mg daily for 2 days, then taper by 1 tablet every 2 days  until gone Doxycycline 1 tablet twice daily with food for 7 days CHERATUSSIN cough syrup 3 times daily as needed for cough   Continue your spiriva daily and your dulera twice daily  .  Use your albuterol inhaler as needed  CONTINUE DOING THE BREATHING EXERCISE I TAUGHT YOU TO GET RID OF RETAINED AIR IN YOUR LUNGS

## 2013-09-26 ENCOUNTER — Encounter: Payer: Self-pay | Admitting: Internal Medicine

## 2013-09-26 NOTE — Assessment & Plan Note (Addendum)
12 day steroid taper,  Antibiotics and cough suppressing medication

## 2013-10-04 ENCOUNTER — Other Ambulatory Visit: Payer: Self-pay | Admitting: Internal Medicine

## 2013-10-10 ENCOUNTER — Other Ambulatory Visit: Payer: Self-pay

## 2013-10-10 MED ORDER — ALBUTEROL SULFATE HFA 108 (90 BASE) MCG/ACT IN AERS: 2.0000 | INHALATION_SPRAY | Freq: Four times a day (QID) | RESPIRATORY_TRACT | Status: DC | PRN

## 2013-10-24 ENCOUNTER — Ambulatory Visit: Payer: Self-pay | Admitting: Family Medicine

## 2013-11-15 ENCOUNTER — Encounter: Payer: Self-pay | Admitting: Internal Medicine

## 2013-11-15 ENCOUNTER — Telehealth: Payer: Self-pay | Admitting: Internal Medicine

## 2013-11-15 ENCOUNTER — Ambulatory Visit (INDEPENDENT_AMBULATORY_CARE_PROVIDER_SITE_OTHER): Payer: Managed Care, Other (non HMO) | Admitting: Internal Medicine

## 2013-11-15 VITALS — BP 114/90 | HR 107 | Temp 97.8°F | Wt 128.0 lb

## 2013-11-15 DIAGNOSIS — J441 Chronic obstructive pulmonary disease with (acute) exacerbation: Secondary | ICD-10-CM

## 2013-11-15 MED ORDER — AZITHROMYCIN 250 MG PO TABS: ORAL_TABLET | ORAL | Status: DC

## 2013-11-15 MED ORDER — PREDNISONE 10 MG PO TABS: ORAL_TABLET | ORAL | Status: DC

## 2013-11-15 NOTE — Telephone Encounter (Signed)
Scheduled at [facility]  2.15

## 2013-11-15 NOTE — Progress Notes (Signed)
HPI  Pt presents to the clinic today with c/o cough, wheezing and shortness of breath. She reports this started 4 days ago but seems to be getting worse. She does have a productive cough of thick white sputum. She denies fever, but has had chills and body aches. She has been using her nebulizer every 4-6 hours. She continue to smoke. She does see pulmonology.  Review of Systems      Past Medical History  Diagnosis Date  . COPD (chronic obstructive pulmonary disease)   . Hyperlipidemia   . Tobacco abuse   . Depression   . Anxiety   . Hypertension     Family History  Problem Relation Age of Onset  . Hypertension Mother   . COPD Father     History   Social History  . Marital Status: Married    Spouse Name: craig    Number of Children: N/A  . Years of Education: N/A   Occupational History  .     Social History Main Topics  . Smoking status: Current Every Day Smoker -- 0.10 packs/day for 40 years    Types: Cigarettes  . Smokeless tobacco: Never Used     Comment: smokes 2 cigarettes/day  . Alcohol Use: No  . Drug Use: No  . Sexual Activity: Not on file   Other Topics Concern  . Not on file   Social History Narrative  . No narrative on file    Allergies  Allergen Reactions  . Aspirin   . Levofloxacin Nausea Only  . Penicillins      Constitutional: Positive fatigue. Denies headache, fever or abrupt weight changes.  HEENT:  Denies eye redness, eye pain, pressure behind the eyes, facial pain, nasal congestion, ear pain, ringing in the ears, wax buildup, runny nose or bloody nose. Respiratory: Positive cough and shortness of breath. Denies difficulty breathing.  Cardiovascular: Denies chest pain, chest tightness, palpitations or swelling in the hands or feet.   No other specific complaints in a complete review of systems (except as listed in HPI above).  Objective:   BP 114/90  Pulse 107  Temp(Src) 97.8 F (36.6 C) (Oral)  Wt 128 lb (58.06 kg)  SpO2  93% Wt Readings from Last 3 Encounters:  11/15/13 128 lb (58.06 kg)  09/25/13 129 lb (58.514 kg)  08/27/13 128 lb (58.06 kg)     General: Appears her stated age, well developed, well nourished in NAD. HEENT: Head: normal shape and size; Eyes: sclera white, no icterus, conjunctiva pink, PERRLA and EOMs intact; Ears: Tm's gray and intact, normal light reflex; Nose: mucosa pink and moist, septum midline; Throat/Mouth: Teeth present, mucosa erythematous and moist, no exudate noted, no lesions or ulcerations noted.  Cardiovascular: Normal rate and rhythm. S1,S2 noted.  No murmur, rubs or gallops noted. No JVD or BLE edema. No carotid bruits noted. Pulmonary/Chest: Normal effort and intermittent bilaterally expiratory wheeze with coarse rhonchi throughout. No respiratory distress.      Assessment & Plan:   COPD exacerbation:  Get some rest and drink plenty of water eRx for pred taper eRx for Azithromax x 5 days I encouraged her to make a follow up with her pulmonologist as this is her 3rd exacerbation this year Encouraged her to quit smoking Work note provided excusing her from work today and tomorrow  RTC as needed or if symptoms persist.

## 2013-11-15 NOTE — Telephone Encounter (Signed)
Patient Information:  Caller Name: [name]  Phone: [phone]  Patient: Diane [name], Diane [name]  Gender: Female  DOB: [birth year]  Age: 58 Years  PCP: Duncan Dullullo, Teresa (Adults only)  Office Follow Up:  Does the office need to follow up with this patient?: No  Instructions For The Office: N/A   Symptoms  Reason For Call & Symptoms: Pt requesting appt due to COPD flare up.  Pt reports breathing is getting worse even with using Nebs and medication per provider instructions.  Reviewed Health History In EMR: Yes  Reviewed Medications In EMR: Yes  Reviewed Allergies In EMR: Yes  Reviewed Surgeries / Procedures: Yes  Date of Onset of Symptoms: 11/11/2013  Guideline(s) Used:  Breathing Difficulty  Disposition Per Guideline:   See Within 3 Days in Office  Reason For Disposition Reached:   Moderate longstanding difficulty breathing (e.g., speaks in phrases, SOB even at rest, pulse 100-120) and same as normal  Advice Given:  Call Back If:  Severe difficulty breathing occurs  You become worse.  Patient Will Follow Care Advice:  YES  Appointment Scheduled:  11/15/2013 14:15:00 Appointment Scheduled Provider:  Other

## 2013-11-15 NOTE — Patient Instructions (Signed)
Chronic Obstructive Pulmonary Disease Chronic obstructive pulmonary disease (COPD) is a common lung condition in which airflow from the lungs is limited. COPD is a general term that can be used to describe many different lung problems that limit airflow, including both chronic bronchitis and emphysema. If you have COPD, your lung function will probably never return to normal, but there are measures you can take to improve lung function and make yourself feel better.  CAUSES   Smoking (common).   Exposure to secondhand smoke.   Genetic problems.  Chronic inflammatory lung diseases or recurrent infections. SYMPTOMS   Shortness of breath, especially with physical activity.   Deep, persistent (chronic) cough with a large amount of thick mucus.   Wheezing.   Rapid breaths (tachypnea).   Gray or bluish discoloration (cyanosis) of the skin, especially in fingers, toes, or lips.   Fatigue.   Weight loss.   Frequent infections or episodes when breathing symptoms become much worse (exacerbations).   Chest tightness. DIAGNOSIS  Your healthcare provider will take a medical history and perform a physical examination to make the initial diagnosis. Additional tests for COPD may include:   Lung (pulmonary) function tests.  Chest X-ray.  CT scan.  Blood tests. TREATMENT  Treatment available to help you feel better when you have COPD include:   Inhaler and nebulizer medicines. These help manage the symptoms of COPD and make your breathing more comfortable  Supplemental oxygen. Supplemental oxygen is only helpful if you have a low oxygen level in your blood.   Exercise and physical activity. These are beneficial for nearly all people with COPD. Some people may also benefit from a pulmonary rehabilitation program. HOME CARE INSTRUCTIONS   Take all medicines (inhaled or pills) as directed by your health care provider.  Only take over-the-counter or prescription medicines  for pain, fever, or discomfort as directed by your health care provider.   Avoid over-the-counter medicines or cough syrups that dry up your airway (such as antihistamines) and slow down the elimination of secretions unless instructed otherwise by your healthcare provider.   If you are a smoker, the most important thing that you can do is stop smoking. Continuing to smoke will cause further lung damage and breathing trouble. Ask your health care provider for help with quitting smoking. He or she can direct you to community resources or hospitals that provide support.  Avoid exposure to irritants such as smoke, chemicals, and fumes that aggravate your breathing.  Use oxygen therapy and pulmonary rehabilitation if directed by your health care provider. If you require home oxygen therapy, ask your healthcare provider whether you should purchase a pulse oximeter to measure your oxygen level at home.   Avoid contact with individuals who have a contagious illness.  Avoid extreme temperature and humidity changes.  Eat healthy foods. Eating smaller, more frequent meals and resting before meals may help you maintain your strength.  Stay active, but balance activity with periods of rest. Exercise and physical activity will help you maintain your ability to do things you want to do.  Preventing infection and hospitalization is very important when you have COPD. Make sure to receive all the vaccines your health care provider recommends, especially the pneumococcal and influenza vaccines. Ask your healthcare provider whether you need a pneumonia vaccine.  Learn and use relaxation techniques to manage stress.  Learn and use controlled breathing techniques as directed by your health care provider. Controlled breathing techniques include:   Pursed lip breathing. Start by breathing   in (inhaling) through your nose for 1 second. Then, purse your lips as if you were going to whistle and breathe out (exhale)  through the pursed lips for 2 seconds.   Diaphragmatic breathing. Start by putting one hand on your abdomen just above your waist. Inhale slowly through your nose. The hand on your abdomen should move out. Then purse your lips and exhale slowly. You should be able to feel the hand on your abdomen moving in as you exhale.   Learn and use controlled coughing to clear mucus from your lungs. Controlled coughing is a series of short, progressive coughs. The steps of controlled coughing are:  1. Lean your head slightly forward.  2. Breathe in deeply using diaphragmatic breathing.  3. Try to hold your breath for 3 seconds.  4. Keep your mouth slightly open while coughing twice.  5. Spit any mucus out into a tissue.  6. Rest and repeat the steps once or twice as needed. SEEK MEDICAL CARE IF:   You are coughing up more mucus than usual.   There is a change in the color or thickness of your mucus.   Your breathing is more labored than usual.   Your breathing is faster than usual.  SEEK IMMEDIATE MEDICAL CARE IF:   You have shortness of breath while you are resting.   You have shortness of breath that prevents you from:  Being able to talk.   Performing your usual physical activities.   You have chest pain lasting longer than 5 minutes.   Your skin color is more cyanotic than usual.  You measure low oxygen saturations for longer than 5 minutes with a pulse oximeter. MAKE SURE YOU:   Understand these instructions.  Will watch your condition.  Will get help right away if you are not doing well or get worse. Document Released: 01/27/2005 Document Revised: 02/07/2013 Document Reviewed: 12/14/2012 Exit[employer] Patient Information 2015 [employer]. This information is not intended to replace advice given to you by your health care provider. Make sure you discuss any questions you have with your health care provider.  

## 2013-11-15 NOTE — Telephone Encounter (Signed)
Relevant patient education assigned to patient using Emmi. ° °

## 2013-11-15 NOTE — Progress Notes (Signed)
Pre visit review using our clinic review tool, if applicable. No additional management support is needed unless otherwise documented below in the visit note. 

## 2013-11-22 ENCOUNTER — Ambulatory Visit (INDEPENDENT_AMBULATORY_CARE_PROVIDER_SITE_OTHER): Payer: Managed Care, Other (non HMO) | Admitting: Adult Health

## 2013-11-22 ENCOUNTER — Ambulatory Visit (INDEPENDENT_AMBULATORY_CARE_PROVIDER_SITE_OTHER)
Admission: RE | Admit: 2013-11-22 | Discharge: 2013-11-22 | Disposition: A | Discharge status: Home / Self Care | Payer: Managed Care, Other (non HMO) | Source: Ambulatory Visit | Attending: Adult Health | Admitting: Adult Health

## 2013-11-22 ENCOUNTER — Encounter: Payer: Self-pay | Admitting: Adult Health

## 2013-11-22 VITALS — BP 140/86 | HR 113 | Temp 97.7°F | Resp 18 | Wt 129.0 lb

## 2013-11-22 DIAGNOSIS — J441 Chronic obstructive pulmonary disease with (acute) exacerbation: Secondary | ICD-10-CM

## 2013-11-22 MED ORDER — GUAIFENESIN-CODEINE 100-10 MG/5ML PO SYRP: 5.0000 mL | ORAL_SOLUTION | Freq: Three times a day (TID) | ORAL | Status: DC | PRN

## 2013-11-22 MED ORDER — PREDNISONE 10 MG PO TABS: ORAL_TABLET | ORAL | Status: DC

## 2013-11-22 MED ORDER — DOXYCYCLINE HYCLATE 100 MG PO TABS: 100.0000 mg | ORAL_TABLET | Freq: Two times a day (BID) | ORAL | Status: DC

## 2013-11-22 MED ORDER — ALBUTEROL SULFATE (2.5 MG/3ML) 0.083% IN NEBU: INHALATION_SOLUTION | RESPIRATORY_TRACT | Status: DC

## 2013-11-22 NOTE — Progress Notes (Signed)
Patient ID: Diane [name], female   DOB: June 18, 1955, 58 y.o.   MRN: [id number]   Subjective:    Patient ID: Diane [name], female    DOB: June 18, 1955, 58 y.o.   MRN: [id number]  HPI  Pt is a pleasant 58 y/o female with hx of COPD, tobacco abuse who presents to clinic with sob, cough, wheezing. She reports symptoms flared on Tuesday. She has been using her nebs, taking meds as prescribed and her scheduled inhalers. Just completed antibiotic (azithromycin) approximately 1 week ago. She reports she has not smoked in 3 days.   Past Medical History  Diagnosis Date  . COPD (chronic obstructive pulmonary disease)   . Hyperlipidemia   . Tobacco abuse   . Depression   . Anxiety   . Hypertension     Current Outpatient Prescriptions on File Prior to Visit  Medication Sig Dispense Refill  . albuterol (VENTOLIN HFA) 108 (90 BASE) MCG/ACT inhaler Inhale 2 puffs into the lungs 4 (four) times daily as needed for wheezing.  18 g  6  . Azelastine-Fluticasone (DYMISTA) 137-50 MCG/ACT SUSP Place 2 Squirts into the nose daily. In each nostril  23 g  11  . citalopram (CELEXA) 20 MG tablet 1.5 pills daily for one week,  Then 2 pills daily  90 tablet  0  . clonazePAM (KLONOPIN) 0.5 MG tablet Take 1 tablet (0.5 mg total) by mouth 2 (two) times daily as needed for anxiety.  60 tablet  2  . Eszopiclone 3 MG TABS Take 1 tablet (3 mg total) by mouth at bedtime. Take immediately before bedtime  30 tablet  4  . fexofenadine (ALLEGRA) 180 MG tablet Take 1 tablet (180 mg total) by mouth daily.  90 tablet  1  . lisinopril (PRINIVIL,ZESTRIL) 10 MG tablet Take 1 tablet (10 mg total) by mouth daily.  90 tablet  2  . mometasone-formoterol (DULERA) 200-5 MCG/ACT AERO Inhale 2 puffs into the lungs 2 (two) times daily.  13 g  11  . Red Yeast Rice 600 MG CAPS Take by mouth 2 (two) times daily.       Marland Kitchen. Spacer/Aero-Holding Chambers (AEROCHAMBER PLUS) inhaler Use as instructed  1 each  2  . SPIRIVA RESPIMAT 2.5 MCG/ACT AERS  INHALE TWO PUFFS ONCE DAILY  4 g  5  . traZODone (DESYREL) 50 MG tablet Take 50-100 mg by mouth at bedtime.      . VENTOLIN HFA 108 (90 BASE) MCG/ACT inhaler INHALE ONE PUFF 4 TIMES DAILY AS NEEDED FOR DYSPNEA  18 each  0   No current facility-administered medications on file prior to visit.     Review of Systems Positive for sob, wheezing, productive cough of white phlegm Negative for fever, chills, chest pain    Objective:  BP 140/86  Pulse 113  Temp(Src) 97.7 F (36.5 C) (Oral)  Resp 18  Wt 129 lb (58.514 kg)  SpO2 92%   Physical Exam  Constitutional: She is oriented to person, place, and time. No distress.  Cardiovascular: Normal rate, regular rhythm and normal heart sounds.   No murmur heard. Pulmonary/Chest: No respiratory distress. She has wheezes. She has no rales.  Rhonchi, wheezing throughout. Air movement diminished posterior bases  Musculoskeletal: Normal range of motion.  Neurological: She is alert and oriented to person, place, and time.  Skin: Skin is warm and dry.  Psychiatric: She has a normal mood and affect. Her behavior is normal. Judgment and thought content normal.  Assessment & Plan:   1. COPD exacerbation Chest xray, continue nebs, stop smoking, doxycycline bid x 10 days, prednisone taper as instructed. RTC on Monday for follow up. - predniSONE (DELTASONE) 10 MG tablet; Start 60 mg today then decrease by 5 mg daily until done.  Dispense: 39 tablet; Refill: 0 - DG Chest 2 View; Future

## 2013-11-22 NOTE — Progress Notes (Signed)
Pre visit review using our clinic review tool, if applicable. No additional management support is needed unless otherwise documented below in the visit note. 

## 2013-11-22 NOTE — Patient Instructions (Signed)
  [facility].  Start doxycycline 100 mg twice a day for 10 days.  Also begin a prednisone taper as follows:   Day one-6 tablets  Day 2 - 5 1/2 tablets  Day 3 - 5 tablets  Day 4 - 4 1/2 tablets  Day 5 - 4 tablets  Day 6 - 3 1/2 tablets  Day 7 - 3 tablets  Day 8 - 2 1/2 tablets  Day 9 - 2 tablets  Day 10 - 1 1/2 tablets  Day 11 - 1 tablet  Day 12 - 1/2 tablet and complete  Drink fluids.  Use your nebulizer regularly during this exacerbations as well as your scheduled inhalers.  Avoid all smoke, irritants or anyone who is sick.  Return for follow up on Monday.

## 2013-11-23 ENCOUNTER — Encounter: Payer: Self-pay | Admitting: Adult Health

## 2013-11-26 ENCOUNTER — Ambulatory Visit (INDEPENDENT_AMBULATORY_CARE_PROVIDER_SITE_OTHER): Payer: Managed Care, Other (non HMO) | Admitting: Adult Health

## 2013-11-26 ENCOUNTER — Encounter: Payer: Self-pay | Admitting: Adult Health

## 2013-11-26 VITALS — BP 104/73 | HR 92 | Temp 97.8°F | Resp 16 | Wt 128.5 lb

## 2013-11-26 DIAGNOSIS — J441 Chronic obstructive pulmonary disease with (acute) exacerbation: Secondary | ICD-10-CM

## 2013-11-26 NOTE — Progress Notes (Signed)
Patient ID: Diane [name], female   DOB: 1955-08-03, 58 y.o.   MRN: [id number]   Subjective:    Patient ID: Diane KeysPeggy A [name], female    DOB: 1955-08-03, 58 y.o.   MRN: [id number]  HPI  Pt is a pleasant 58 y/o female who presents for 1 week f/u of COPD exacerbation. Continues all her inhalers and remains on prednisone taper. Feeling improved. Still sob with exertion. Cough improved.  Past Medical History  Diagnosis Date  . COPD (chronic obstructive pulmonary disease)   . Hyperlipidemia   . Tobacco abuse   . Depression   . Anxiety   . Hypertension     Current Outpatient Prescriptions on File Prior to Visit  Medication Sig Dispense Refill  . albuterol (PROVENTIL) (2.5 MG/3ML) 0.083% nebulizer solution USE ONE VIAL IN NEBULIZER EVERY 6 HOURS AS NEEDED FOR WHEEZING  180 mL  3  . albuterol (VENTOLIN HFA) 108 (90 BASE) MCG/ACT inhaler Inhale 2 puffs into the lungs 4 (four) times daily as needed for wheezing.  18 g  6  . Azelastine-Fluticasone (DYMISTA) 137-50 MCG/ACT SUSP Place 2 Squirts into the nose daily. In each nostril  23 g  11  . citalopram (CELEXA) 20 MG tablet 1.5 pills daily for one week,  Then 2 pills daily  90 tablet  0  . clonazePAM (KLONOPIN) 0.5 MG tablet Take 1 tablet (0.5 mg total) by mouth 2 (two) times daily as needed for anxiety.  60 tablet  2  . doxycycline (VIBRA-TABS) 100 MG tablet Take 1 tablet (100 mg total) by mouth 2 (two) times daily.  20 tablet  0  . Eszopiclone 3 MG TABS Take 1 tablet (3 mg total) by mouth at bedtime. Take immediately before bedtime  30 tablet  4  . fexofenadine (ALLEGRA) 180 MG tablet Take 1 tablet (180 mg total) by mouth daily.  90 tablet  1  . guaiFENesin-codeine (CHERATUSSIN AC) 100-10 MG/5ML syrup Take 5 mLs by mouth 3 (three) times daily as needed for cough.  120 mL  0  . lisinopril (PRINIVIL,ZESTRIL) 10 MG tablet Take 1 tablet (10 mg total) by mouth daily.  90 tablet  2  . mometasone-formoterol (DULERA) 200-5 MCG/ACT AERO Inhale 2 puffs into  the lungs 2 (two) times daily.  13 g  11  . predniSONE (DELTASONE) 10 MG tablet Start 60 mg today then decrease by 5 mg daily until done.  39 tablet  0  . Red Yeast Rice 600 MG CAPS Take by mouth 2 (two) times daily.       Marland Kitchen. Spacer/Aero-Holding Chambers (AEROCHAMBER PLUS) inhaler Use as instructed  1 each  2  . SPIRIVA RESPIMAT 2.5 MCG/ACT AERS INHALE TWO PUFFS ONCE DAILY  4 g  5  . traZODone (DESYREL) 50 MG tablet Take 50-100 mg by mouth at bedtime.      . VENTOLIN HFA 108 (90 BASE) MCG/ACT inhaler INHALE ONE PUFF 4 TIMES DAILY AS NEEDED FOR DYSPNEA  18 each  0   No current facility-administered medications on file prior to visit.     Review of Systems  Constitutional: Negative for fever and chills.  HENT: Negative.   Respiratory: Positive for cough, chest tightness, shortness of breath and wheezing.        Feels improvement from last week. Continues prednisone taper.  Cardiovascular: Negative.   Gastrointestinal: Negative.   All other systems reviewed and are negative.      Objective:  BP 104/73  Pulse 92  Temp(Src) 97.8  F (36.6 C) (Oral)  Resp 16  Wt 128 lb 8 oz (58.287 kg)  SpO2 95%   Physical Exam  Constitutional: She is oriented to person, place, and time. No distress.  HENT:  Head: Normocephalic and atraumatic.  Eyes: Conjunctivae and EOM are normal.  Neck: Normal range of motion. Neck supple.  Cardiovascular: Normal rate, regular rhythm, normal heart sounds and intact distal pulses.  Exam reveals no gallop and no friction rub.   No murmur heard. Pulmonary/Chest: No respiratory distress. She has wheezes (improved. Still has exp wheezing mainly posterior left apex). She has no rales.  Musculoskeletal: Normal range of motion.  Neurological: She is alert and oriented to person, place, and time. She has normal reflexes. Coordination normal.  Skin: Skin is warm and dry.  Psychiatric: She has a normal mood and affect. Her behavior is normal. Judgment and thought content  normal.      Assessment & Plan:   1. COPD exacerbation She has significantly improved but still having some posterior upper lobe wheezing bilaterally. The left is worse than the right. She remains on the prednisone taper. Continues using nebulizer and inhalers as prescribed. Encouraged breathing exercises. Letter to return to work on Wednesday if symptoms continue to improve. Remains smoke free. I have congratulated her for this. Using nicorette gum.

## 2013-11-26 NOTE — Progress Notes (Signed)
Pre visit review using our clinic review tool, if applicable. No additional management support is needed unless otherwise documented below in the visit note. 

## 2013-11-26 NOTE — Patient Instructions (Signed)
  Continue prednisone taper.  Breathing exercises every 2-3 hours.  Drink fluids.  Use inhalers as prescribed. Use nebulizer while at home.  Return to work on Wednesday.

## 2013-12-03 ENCOUNTER — Other Ambulatory Visit: Payer: Self-pay | Admitting: Internal Medicine

## 2013-12-04 NOTE — Telephone Encounter (Signed)
Refill

## 2013-12-16 ENCOUNTER — Inpatient Hospital Stay: Payer: Self-pay | Admitting: Internal Medicine

## 2013-12-16 LAB — COMPREHENSIVE METABOLIC PANEL
Albumin: 3.9 g/dL (ref 3.4–5.0)
Alkaline Phosphatase: 50 U/L
Anion Gap: 9 (ref 7–16)
BUN: 6 mg/dL — ABNORMAL LOW (ref 7–18)
Bilirubin,Total: 0.3 mg/dL (ref 0.2–1.0)
CO2: 28 mmol/L (ref 21–32)
CREATININE: 0.8 mg/dL (ref 0.60–1.30)
Calcium, Total: 9.3 mg/dL (ref 8.5–10.1)
Chloride: 105 mmol/L (ref 98–107)
EGFR (African American): >60
EGFR (Non-African Amer.): >60
GLUCOSE: 100 mg/dL — AB (ref 65–99)
Osmolality: 281 (ref 275–301)
Potassium: 4.1 mmol/L (ref 3.5–5.1)
SGOT(AST): 26 U/L (ref 15–37)
SGPT (ALT): 23 U/L
Sodium: 142 mmol/L (ref 136–145)
Total Protein: 7.6 g/dL (ref 6.4–8.2)

## 2013-12-16 LAB — TROPONIN I: Troponin-I: <0.02 ng/mL

## 2013-12-16 LAB — CBC
HCT: 45.6 % (ref 35.0–47.0)
HGB: 14.4 g/dL (ref 12.0–16.0)
MCH: 29.6 pg (ref 26.0–34.0)
MCHC: 31.6 g/dL — ABNORMAL LOW (ref 32.0–36.0)
MCV: 94 fL (ref 80–100)
PLATELETS: 329 10*3/uL (ref 150–440)
RBC: 4.87 10*6/uL (ref 3.80–5.20)
RDW: 13.1 % (ref 11.5–14.5)
WBC: 6 10*3/uL (ref 3.6–11.0)

## 2013-12-16 LAB — PRO B NATRIURETIC PEPTIDE: B-Type Natriuretic Peptide: 163 pg/mL — ABNORMAL HIGH (ref 0–125)

## 2013-12-17 LAB — CBC WITH DIFFERENTIAL/PLATELET
BASOS ABS: 0 10*3/uL (ref 0.0–0.1)
Basophil %: 0.1 %
Eosinophil #: 0 10*3/uL (ref 0.0–0.7)
Eosinophil %: 0 %
HCT: 40.7 % (ref 35.0–47.0)
HGB: 13.6 g/dL (ref 12.0–16.0)
LYMPHS PCT: 10.1 %
Lymphocyte #: 0.5 10*3/uL — ABNORMAL LOW (ref 1.0–3.6)
MCH: 30.9 pg (ref 26.0–34.0)
MCHC: 33.5 g/dL (ref 32.0–36.0)
MCV: 92 fL (ref 80–100)
MONOS PCT: 1.5 %
Monocyte #: 0.1 x10 3/mm — ABNORMAL LOW (ref 0.2–0.9)
NEUTROS PCT: 88.3 %
Neutrophil #: 4.2 10*3/uL (ref 1.4–6.5)
Platelet: 279 10*3/uL (ref 150–440)
RBC: 4.4 10*6/uL (ref 3.80–5.20)
RDW: 13.1 % (ref 11.5–14.5)
WBC: 4.8 10*3/uL (ref 3.6–11.0)

## 2013-12-17 LAB — BASIC METABOLIC PANEL
ANION GAP: 10 (ref 7–16)
BUN: 8 mg/dL (ref 7–18)
CO2: 24 mmol/L (ref 21–32)
Calcium, Total: 8.6 mg/dL (ref 8.5–10.1)
Chloride: 107 mmol/L (ref 98–107)
Creatinine: 0.76 mg/dL (ref 0.60–1.30)
EGFR (African American): >60
EGFR (Non-African Amer.): >60
Glucose: 136 mg/dL — ABNORMAL HIGH (ref 65–99)
OSMOLALITY: 282 (ref 275–301)
Potassium: 4 mmol/L (ref 3.5–5.1)
Sodium: 141 mmol/L (ref 136–145)

## 2013-12-20 DIAGNOSIS — I509 Heart failure, unspecified: Secondary | ICD-10-CM

## 2013-12-20 LAB — BASIC METABOLIC PANEL
Anion Gap: 11 (ref 7–16)
BUN: 13 mg/dL (ref 7–18)
CREATININE: 0.96 mg/dL (ref 0.60–1.30)
Calcium, Total: 9.3 mg/dL (ref 8.5–10.1)
Chloride: 101 mmol/L (ref 98–107)
Co2: 27 mmol/L (ref 21–32)
EGFR (Non-African Amer.): >60
Glucose: 121 mg/dL — ABNORMAL HIGH (ref 65–99)
OSMOLALITY: 279 (ref 275–301)
POTASSIUM: 4 mmol/L (ref 3.5–5.1)
SODIUM: 139 mmol/L (ref 136–145)

## 2013-12-20 LAB — PRO B NATRIURETIC PEPTIDE: B-Type Natriuretic Peptide: 1178 pg/mL — ABNORMAL HIGH (ref 0–125)

## 2013-12-22 LAB — EXPECTORATED SPUTUM ASSESSMENT W REFEX TO RESP CULTURE

## 2013-12-24 LAB — CREATININE, SERUM
CREATININE: 0.85 mg/dL (ref 0.60–1.30)
EGFR (African American): >60

## 2013-12-27 ENCOUNTER — Ambulatory Visit (INDEPENDENT_AMBULATORY_CARE_PROVIDER_SITE_OTHER): Payer: Managed Care, Other (non HMO) | Admitting: Internal Medicine

## 2013-12-27 ENCOUNTER — Encounter: Payer: Self-pay | Admitting: Internal Medicine

## 2013-12-27 VITALS — BP 108/70 | HR 98 | Temp 97.6°F | Resp 18 | Ht 66.0 in | Wt 126.0 lb

## 2013-12-27 DIAGNOSIS — F411 Generalized anxiety disorder: Secondary | ICD-10-CM

## 2013-12-27 DIAGNOSIS — Z720 Tobacco use: Secondary | ICD-10-CM

## 2013-12-27 DIAGNOSIS — Z8709 Personal history of other diseases of the respiratory system: Secondary | ICD-10-CM

## 2013-12-27 DIAGNOSIS — B3781 Candidal esophagitis: Secondary | ICD-10-CM

## 2013-12-27 DIAGNOSIS — Z23 Encounter for immunization: Secondary | ICD-10-CM

## 2013-12-27 DIAGNOSIS — Z716 Tobacco abuse counseling: Secondary | ICD-10-CM

## 2013-12-27 DIAGNOSIS — Z7189 Other specified counseling: Secondary | ICD-10-CM

## 2013-12-27 DIAGNOSIS — F419 Anxiety disorder, unspecified: Secondary | ICD-10-CM

## 2013-12-27 DIAGNOSIS — B370 Candidal stomatitis: Secondary | ICD-10-CM

## 2013-12-27 DIAGNOSIS — J151 Pneumonia due to Pseudomonas: Secondary | ICD-10-CM

## 2013-12-27 DIAGNOSIS — F17200 Nicotine dependence, unspecified, uncomplicated: Secondary | ICD-10-CM

## 2013-12-27 MED ORDER — HYDROCOD POLST-CHLORPHEN POLST 10-8 MG/5ML PO LQCR: 5.0000 mL | Freq: Two times a day (BID) | ORAL | Status: DC | PRN

## 2013-12-27 MED ORDER — NYSTATIN 100000 UNIT/ML MT SUSP: 500000.0000 [IU] | Freq: Four times a day (QID) | OROMUCOSAL | Status: DC

## 2013-12-27 MED ORDER — BENZONATATE 200 MG PO CAPS: 200.0000 mg | ORAL_CAPSULE | Freq: Three times a day (TID) | ORAL | Status: DC | PRN

## 2013-12-27 NOTE — Patient Instructions (Addendum)
Please take a probiotic ( Align, Flora star or Culturelle)  Or the generic form while you are taking antibiotic to prevent a serious antibiotic associated diarrhea  Called clostridium dificile colitis and a vaginal yeast infection .    YOU HAVE THRUSH FROM THE PREDNISONE  NYSTATIN SWISH 5 ML IN MOUTH AND SWALLOW ,  4 TIMES DAILY  UNTIL GONE   REMAIN OUT OF WORK UNTIL SEPT 14TH    COME SEE ME  THE WEEK PRIOR TO MAKE SURE YOU ARE READY
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
## 2013-12-27 NOTE — Progress Notes (Signed)
Pre-visit discussion using our clinic review tool. No additional management support is needed unless otherwise documented below in the visit note.  

## 2013-12-29 ENCOUNTER — Encounter: Payer: Self-pay | Admitting: Internal Medicine

## 2013-12-29 DIAGNOSIS — B370 Candidal stomatitis: Secondary | ICD-10-CM | POA: Insufficient documentation

## 2013-12-29 DIAGNOSIS — Z716 Tobacco abuse counseling: Secondary | ICD-10-CM | POA: Insufficient documentation

## 2013-12-29 DIAGNOSIS — B3781 Candidal esophagitis: Secondary | ICD-10-CM

## 2013-12-29 DIAGNOSIS — J151 Pneumonia due to Pseudomonas: Secondary | ICD-10-CM | POA: Insufficient documentation

## 2013-12-29 NOTE — Assessment & Plan Note (Signed)
Cultured from sputum.  treated with levaquin and steroid taper for COPD exacerbation.  Advised to resume probiotic.

## 2013-12-29 NOTE — Assessment & Plan Note (Signed)
Smoking cessation instruction/counseling given:  counseled patient on the dangers of tobacco use, advised patient to stop smoking, and reviewed strategies to maximize success 

## 2013-12-29 NOTE — Assessment & Plan Note (Signed)
Secondary to steroid use  Nystatin swish and swallow.

## 2013-12-29 NOTE — Assessment & Plan Note (Signed)
Symptoms have been managed with daily SSRI therapy, medication for insomnia, and frequent use of anxiolytics.  No changes today

## 2013-12-29 NOTE — Assessment & Plan Note (Signed)
She had resumed smoking prior to most recent hospitalization due to anxiety. Marland Kitchenadvised to consider nicoderm patches

## 2013-12-30 ENCOUNTER — Telehealth: Payer: Self-pay | Admitting: Internal Medicine

## 2013-12-30 NOTE — Assessment & Plan Note (Signed)
She is currently hypoxic requiring supplemental 02 at 2 L min.  Will continue  Until  follow up appt on Sept 4 2015.

## 2013-12-30 NOTE — Telephone Encounter (Signed)
FMLA formhas been done except for administrative info.. And is i n red folder

## 2013-12-31 DIAGNOSIS — Z0279 Encounter for issue of other medical certificate: Secondary | ICD-10-CM

## 2013-12-31 NOTE — Telephone Encounter (Signed)
Patient notified FMLA ready, placed at front desk, sent for billing and to scan to chart.

## 2014-01-04 ENCOUNTER — Encounter: Payer: Self-pay | Admitting: Internal Medicine

## 2014-01-04 ENCOUNTER — Ambulatory Visit (INDEPENDENT_AMBULATORY_CARE_PROVIDER_SITE_OTHER): Payer: Managed Care, Other (non HMO) | Admitting: Internal Medicine

## 2014-01-04 VITALS — BP 106/62 | HR 91 | Temp 98.2°F | Resp 16 | Wt 133.8 lb

## 2014-01-04 DIAGNOSIS — Z79899 Other long term (current) drug therapy: Secondary | ICD-10-CM

## 2014-01-04 DIAGNOSIS — J438 Other emphysema: Secondary | ICD-10-CM

## 2014-01-04 DIAGNOSIS — B370 Candidal stomatitis: Secondary | ICD-10-CM

## 2014-01-04 DIAGNOSIS — J9600 Acute respiratory failure, unspecified whether with hypoxia or hypercapnia: Secondary | ICD-10-CM

## 2014-01-04 DIAGNOSIS — E559 Vitamin D deficiency, unspecified: Secondary | ICD-10-CM

## 2014-01-04 DIAGNOSIS — E785 Hyperlipidemia, unspecified: Secondary | ICD-10-CM

## 2014-01-04 DIAGNOSIS — J9620 Acute and chronic respiratory failure, unspecified whether with hypoxia or hypercapnia: Secondary | ICD-10-CM

## 2014-01-04 DIAGNOSIS — J9601 Acute respiratory failure with hypoxia: Secondary | ICD-10-CM

## 2014-01-04 DIAGNOSIS — B3781 Candidal esophagitis: Secondary | ICD-10-CM

## 2014-01-04 DIAGNOSIS — R634 Abnormal weight loss: Secondary | ICD-10-CM

## 2014-01-04 DIAGNOSIS — J439 Emphysema, unspecified: Secondary | ICD-10-CM

## 2014-01-04 MED ORDER — CITALOPRAM HYDROBROMIDE 40 MG PO TABS: 40.0000 mg | ORAL_TABLET | Freq: Every day | ORAL | Status: DC
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
## 2014-01-04 NOTE — Progress Notes (Signed)
Pre visit review using our clinic review tool, if applicable. No additional management support is needed unless otherwise documented below in the visit note. 

## 2014-01-06 ENCOUNTER — Encounter: Payer: Self-pay | Admitting: Internal Medicine

## 2014-01-06 NOTE — Assessment & Plan Note (Signed)
Secondary to inhaled steroid,s  Now resolved.

## 2014-01-06 NOTE — Assessment & Plan Note (Signed)
Resolving .  Room air saturations have improved and she is no longer O2 dependent with normal daily activities, but will need an overnight pulse oximetry on room air before I will dc the order for 02.  This has been ordered. She may return to work on Sept 14

## 2014-01-09 ENCOUNTER — Telehealth: Payer: Self-pay | Admitting: *Deleted

## 2014-01-09 NOTE — Telephone Encounter (Signed)
Order confirmation received from [facility].

## 2014-01-09 NOTE — Telephone Encounter (Signed)
Faxed order for overnight pulse oximetry to [facility].

## 2014-01-10 ENCOUNTER — Telehealth: Payer: Self-pay | Admitting: Internal Medicine

## 2014-01-10 ENCOUNTER — Encounter: Payer: Self-pay | Admitting: *Deleted

## 2014-01-10 ENCOUNTER — Other Ambulatory Visit (INDEPENDENT_AMBULATORY_CARE_PROVIDER_SITE_OTHER): Payer: Managed Care, Other (non HMO)

## 2014-01-10 ENCOUNTER — Encounter: Payer: Self-pay | Admitting: Internal Medicine

## 2014-01-10 DIAGNOSIS — R634 Abnormal weight loss: Secondary | ICD-10-CM

## 2014-01-10 DIAGNOSIS — E785 Hyperlipidemia, unspecified: Secondary | ICD-10-CM

## 2014-01-10 DIAGNOSIS — Z0279 Encounter for issue of other medical certificate: Secondary | ICD-10-CM

## 2014-01-10 DIAGNOSIS — E559 Vitamin D deficiency, unspecified: Secondary | ICD-10-CM

## 2014-01-10 LAB — COMPREHENSIVE METABOLIC PANEL
ALT: 24 U/L (ref 0–35)
AST: 20 U/L (ref 0–37)
Albumin: 3.7 g/dL (ref 3.5–5.2)
Alkaline Phosphatase: 36 U/L — ABNORMAL LOW (ref 39–117)
BILIRUBIN TOTAL: 0.6 mg/dL (ref 0.2–1.2)
BUN: 14 mg/dL (ref 6–23)
CO2: 28 meq/L (ref 19–32)
Calcium: 9.2 mg/dL (ref 8.4–10.5)
Chloride: 101 mEq/L (ref 96–112)
Creatinine, Ser: 0.7 mg/dL (ref 0.4–1.2)
GFR: 85.93 mL/min (ref >60.00)
GLUCOSE: 78 mg/dL (ref 70–99)
Potassium: 4.2 mEq/L (ref 3.5–5.1)
Sodium: 134 mEq/L — ABNORMAL LOW (ref 135–145)
TOTAL PROTEIN: 6.9 g/dL (ref 6.0–8.3)

## 2014-01-10 LAB — LIPID PANEL
CHOL/HDL RATIO: 4
Cholesterol: 253 mg/dL — ABNORMAL HIGH (ref 0–200)
HDL: 71.1 mg/dL (ref >39.00)
LDL CALC: 156 mg/dL — AB (ref 0–99)
NonHDL: 181.9
TRIGLYCERIDES: 128 mg/dL (ref 0.0–149.0)
VLDL: 25.6 mg/dL (ref 0.0–40.0)

## 2014-01-10 LAB — VITAMIN D 25 HYDROXY (VIT D DEFICIENCY, FRACTURES): VITD: 30.94 ng/mL (ref 30.00–100.00)

## 2014-01-10 LAB — TSH: TSH: 1.61 u[IU]/mL (ref 0.35–4.50)

## 2014-01-10 LAB — MAGNESIUM: Magnesium: 2.1 mg/dL (ref 1.5–2.5)

## 2014-01-10 NOTE — Telephone Encounter (Signed)
Patient'sdiability form from [employer]  is ready for pickup

## 2014-01-11 NOTE — Telephone Encounter (Signed)
Faxed to [employer] with office notes per request.

## 2014-01-11 NOTE — Telephone Encounter (Signed)
Left message with pt to call back if she wants to pick up copy for her records.

## 2014-01-15 ENCOUNTER — Encounter: Payer: Self-pay | Admitting: Internal Medicine

## 2014-01-21 ENCOUNTER — Telehealth: Payer: Self-pay | Admitting: Internal Medicine

## 2014-01-21 ENCOUNTER — Encounter: Payer: Self-pay | Admitting: Family Medicine

## 2014-01-21 ENCOUNTER — Ambulatory Visit (INDEPENDENT_AMBULATORY_CARE_PROVIDER_SITE_OTHER): Payer: Managed Care, Other (non HMO) | Admitting: Family Medicine

## 2014-01-21 ENCOUNTER — Ambulatory Visit (INDEPENDENT_AMBULATORY_CARE_PROVIDER_SITE_OTHER)
Admission: RE | Admit: 2014-01-21 | Discharge: 2014-01-21 | Disposition: A | Discharge status: Home / Self Care | Payer: Managed Care, Other (non HMO) | Source: Ambulatory Visit | Attending: Family Medicine | Admitting: Family Medicine

## 2014-01-21 VITALS — BP 96/70 | HR 128 | Temp 97.7°F | Ht 66.0 in | Wt 128.5 lb

## 2014-01-21 DIAGNOSIS — J441 Chronic obstructive pulmonary disease with (acute) exacerbation: Secondary | ICD-10-CM

## 2014-01-21 MED ORDER — PREDNISONE 20 MG PO TABS: ORAL_TABLET | ORAL | Status: DC

## 2014-01-21 NOTE — Progress Notes (Signed)
Pre visit review using our clinic review tool, if applicable. No additional management support is needed unless otherwise documented below in the visit note. 

## 2014-01-21 NOTE — Patient Instructions (Signed)
Continue current medicines Start the prednisone taper at 60 mg then stay on 20 mg until you get an appt at Dr Melina Schools office  Chest xray on the way out  Drink fluids and rest  Stay out of work 2 days to start

## 2014-01-21 NOTE — Telephone Encounter (Signed)
Olegario Messier you can give Ms Fanara the 2:00 or the 11:00 on Wednesday.  The 10:15 is reserved for Jannifer Franklin if she will take it.  For hospital follow up

## 2014-01-21 NOTE — Telephone Encounter (Signed)
Pt was in to see dr tower today and dr tower wanted pt to come back later this week for a re-check   Pt would like to come in 01/23/14 if possible she is going back to work on 01/24/14

## 2014-01-21 NOTE — Assessment & Plan Note (Signed)
In pt recently hospitalized for pneumonia with interval improvement cxr today  Prednisone taper 60 mg to 20 daily until seen at her PCP office  Continue 02 continuous at 2L  Continue inhalers/rescue meds   Will f/u with PCP later this week for re check

## 2014-01-21 NOTE — Telephone Encounter (Signed)
Left message for pt to return my call.

## 2014-01-21 NOTE — Telephone Encounter (Signed)
FYI

## 2014-01-21 NOTE — Telephone Encounter (Signed)
Patient Information:  Caller Name: Alexandra  Phone: [phone]  Patient: Diane [name], Diane [name]  Gender: Female  DOB: 11-May-1955  Age: 58 Years  PCP: Duncan Dull (Adults only)  Office Follow Up:  Does the office need to follow up with this patient?: No  Instructions For The Office: N/A  RN Note:  Recent 9 day hosptialization at [facility] for pneumonia.  Had post hospitalization follow up 01/04/14.  No audible wheezing on phone; wheezes when supine. No peak flow meter.  O2 saturation 89% on O2 at 2 lpm after walking to get device; O2 saturation returned to 92% after sitting.  Advised to see provider now for sounds very weak or sick to triager, RN judgement and appointment available within 4 hours per MD protocol for ED disposition. No appointments remain in [place] office.  Scheduled at [facility] for 1230 with Dr Milinda Antis.   Symptoms  Reason For Call & Symptoms: Breathing problem;  short of breath with exertion and at times when resting/sitting.  Began using O2 ATC. Using Albuterol every 4 hours.  Reviewed Health History In EMR: Yes  Reviewed Medications In EMR: Yes  Reviewed Allergies In EMR: Yes  Reviewed Surgeries / Procedures: Yes  Date of Onset of Symptoms: 01/18/2014  Treatments Tried: Began using O2 ATC, Albuterol neb every 4 hours  Treatments Tried Worked: Yes  Guideline(s) Used:  Breathing Difficulty  Asthma Attack  Disposition Per Guideline:   Go to ED Now (or to Office with PCP Approval)  Reason For Disposition Reached:   Patient sounds very sick or weak to the triager  Advice Given:  Quick-Relief Asthma Medicine:   Start your quick-relief medicine (e.g., albuterol, salbutamol) at the first sign of any coughing or shortness of breath (don't wait for wheezing). Use your inhaler (2 puffs each time) or nebulizer every 4 hours. Continue the quick-relief medicine until you have not wheezed or coughed for 48 hours.  The best "cough medicine" for an adult with asthma is  always the asthma medicine (Note: Don't use cough suppressants, but cough drops may help a tickly cough).  Long-Term-Control Asthma Medicine:  If you are using a controller medicine (e.g., inhaled steroids or cromolyn), continue to take it as directed.  Drinking Liquids:  Try to drink normal amount of liquids (e.g., water). Being adequately hydrated makes it easier to cough up the sticky lung mucus.  Humidifier:   If the air is dry, use a cool mist humidifier to prevent drying of the upper airway.  Call Back If:  Inhaled asthma medicine (nebulizer or inhaler) is needed more often than every 4 hours  Wheezing has not completely cleared after 5 days  You become worse.  Patient Will Follow Care Advice:  YES  Appointment Scheduled:  01/21/2014 12:30:00 Appointment Scheduled Provider:  Other

## 2014-01-21 NOTE — Telephone Encounter (Signed)
Pt notified, confirmed appt on 01/23/14 @ 11:00.
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
## 2014-01-23 ENCOUNTER — Encounter: Payer: Self-pay | Admitting: Internal Medicine

## 2014-01-23 ENCOUNTER — Ambulatory Visit (INDEPENDENT_AMBULATORY_CARE_PROVIDER_SITE_OTHER): Payer: Managed Care, Other (non HMO) | Admitting: Internal Medicine

## 2014-01-23 VITALS — BP 118/80 | HR 83 | Temp 98.4°F | Resp 16 | Ht 66.0 in | Wt 131.8 lb

## 2014-01-23 DIAGNOSIS — J441 Chronic obstructive pulmonary disease with (acute) exacerbation: Secondary | ICD-10-CM

## 2014-01-23 DIAGNOSIS — J438 Other emphysema: Secondary | ICD-10-CM

## 2014-01-23 DIAGNOSIS — J432 Centrilobular emphysema: Secondary | ICD-10-CM

## 2014-01-23 DIAGNOSIS — G4730 Sleep apnea, unspecified: Secondary | ICD-10-CM

## 2014-01-23 MED ORDER — MONTELUKAST SODIUM 10 MG PO TABS: 10.0000 mg | ORAL_TABLET | Freq: Every day | ORAL | Status: DC

## 2014-01-23 NOTE — Progress Notes (Signed)
Pre-visit discussion using our clinic review tool. No additional management support is needed unless otherwise documented below in the visit note.  
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
## 2014-01-23 NOTE — Patient Instructions (Signed)
You may return to work tomorrow  I am adding singulair once daily for your allergies

## 2014-01-26 ENCOUNTER — Telehealth: Payer: Self-pay | Admitting: Internal Medicine

## 2014-01-26 ENCOUNTER — Encounter: Payer: Self-pay | Admitting: Internal Medicine

## 2014-01-26 MED ORDER — PREDNISONE 5 MG PO TABS: 5.0000 mg | ORAL_TABLET | Freq: Every day | ORAL | Status: DC

## 2014-01-26 NOTE — Assessment & Plan Note (Signed)
No wheezing on today's exam.  She is currently taking  daily,  Starts 20 mg daily tomorrow. Will continue 20 mg daily for one week, then 10 mg daily for one week , then 10 mg evr other day until she can see Dr, Dema Severin.   Adding singulair  As a trial to mediate any atopic compoenent .  I acknowledge patients frequent exacerbations and apparent relapses which occur within a week of her prednisone tapers ending.  I do not want to start a daily maintenance dose of prednison unless we have exhausted other options

## 2014-01-26 NOTE — Telephone Encounter (Signed)
I  Did not address her prednisone dose at her visit this week.  She was given #30 20 mg tablets by Dr Milinda Antis on  9/21 and was told to take  3 daily for 3 days, then 2 daily for 3 days..  She  should be taking 1 daily currently  Starting on the 27th..    I would like  her to continue 1 daily until Wednesday,  Then start 1/2 tablet daily for 7 days.  I will call in a 5 mg tablet for her to take daily after that until she sees pulmonolgy. Marland Kitchen

## 2014-01-28 NOTE — Telephone Encounter (Signed)
Patient notified of therapy with prednisone patient ask if we were setting her with Dr. Dema Severin instead of McQuaid I see no referral.

## 2014-01-28 NOTE — Telephone Encounter (Signed)
Her checkout note last week  Said"needs to see mcQuaid or Mungal,  Does she actually need referral since she has seen mcquaid in this office?

## 2014-01-28 NOTE — Telephone Encounter (Signed)
Left message for patient to return call to office. 

## 2014-01-29 ENCOUNTER — Telehealth: Payer: Self-pay | Admitting: Internal Medicine

## 2014-01-29 DIAGNOSIS — J438 Other emphysema: Secondary | ICD-10-CM

## 2014-01-29 NOTE — Telephone Encounter (Signed)
Oximetry report has ben received.  Her ambulatory sats require continued supplemental oxygen.  She will need a conserving regulator and smaller tanks to use when working.   DME written

## 2014-01-29 NOTE — Telephone Encounter (Signed)
Patient just needs to schedule appointment patient notified. FYI Patient staed she will set up.

## 2014-01-29 NOTE — Assessment & Plan Note (Signed)
Oximetry report done.  Ambulatory sats require continued supplemental oxygen.  She will need a conserving regulator and smaller tanks to use when working.

## 2014-01-30 DIAGNOSIS — G4730 Sleep apnea, unspecified: Secondary | ICD-10-CM | POA: Insufficient documentation

## 2014-01-30 NOTE — Assessment & Plan Note (Signed)
Her overnight pulse oximetry demonstrated desaturations to < 88% on room air due to sleep apnea complicated by advanced COPD.  She will require noninvasive ventilation with CPAP/.BIPAP to prevent life threatening complications including sudden death and stroke

## 2014-01-30 NOTE — Assessment & Plan Note (Signed)
Diagnosed by recent overnight home sleep study, with hypoxia and underlying severe COPD.  She will require positive pressure ventilation with supplemental 02 to prevent life threatening complications including heart failure, sudden death and stroke.

## 2014-01-31 NOTE — Telephone Encounter (Signed)
Patient notified and DME order sent [employer].

## 2014-02-08 ENCOUNTER — Other Ambulatory Visit: Payer: Self-pay | Admitting: *Deleted

## 2014-02-08 MED ORDER — MOMETASONE FURO-FORMOTEROL FUM 200-5 MCG/ACT IN AERO: 2.0000 | INHALATION_SPRAY | Freq: Two times a day (BID) | RESPIRATORY_TRACT | Status: DC

## 2014-02-13 ENCOUNTER — Ambulatory Visit: Payer: Managed Care, Other (non HMO) | Admitting: Pulmonary Disease

## 2014-02-13 IMAGING — US ABDOMEN ULTRASOUND LIMITED
1 series · 14 of 25 positions shown · non-contrast
Comparison: MRI 06/16/2012

CLINICAL DATA: Nausea and epigastric pain

EXAM:
US ABDOMEN LIMITED - RIGHT UPPER QUADRANT

[Series 1: abdomen ultrasound limited · 0.26mm/px · 14 of 41 slices shown]
[im 1/41]
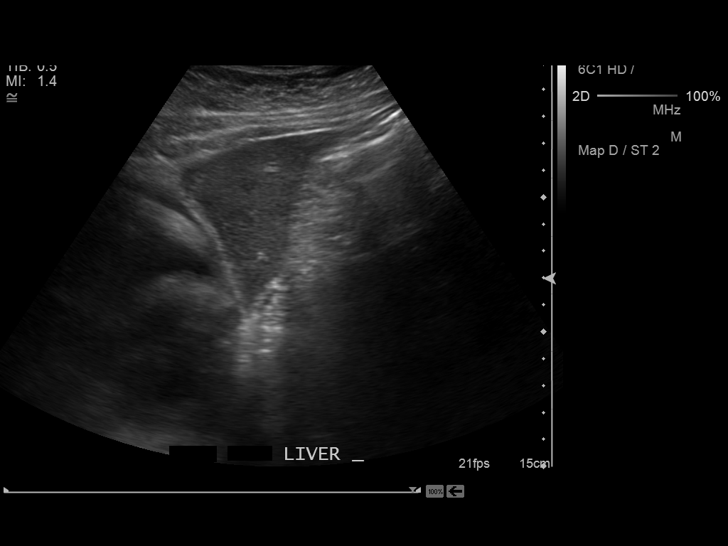
[im 4/41]
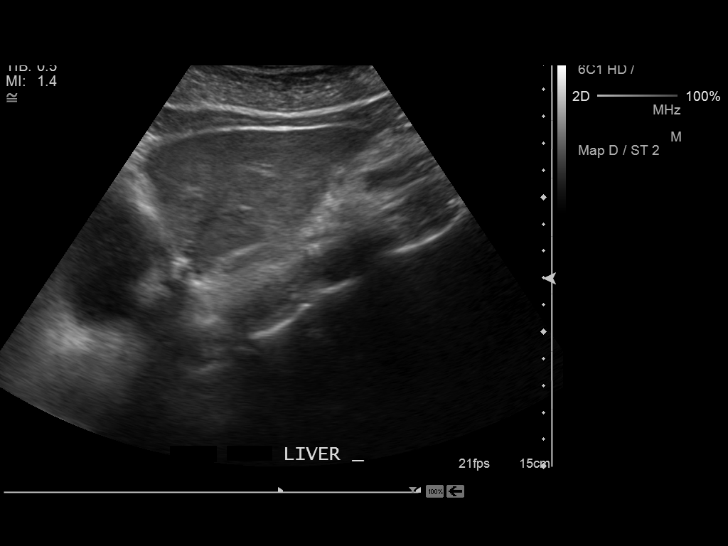
[im 7/41]
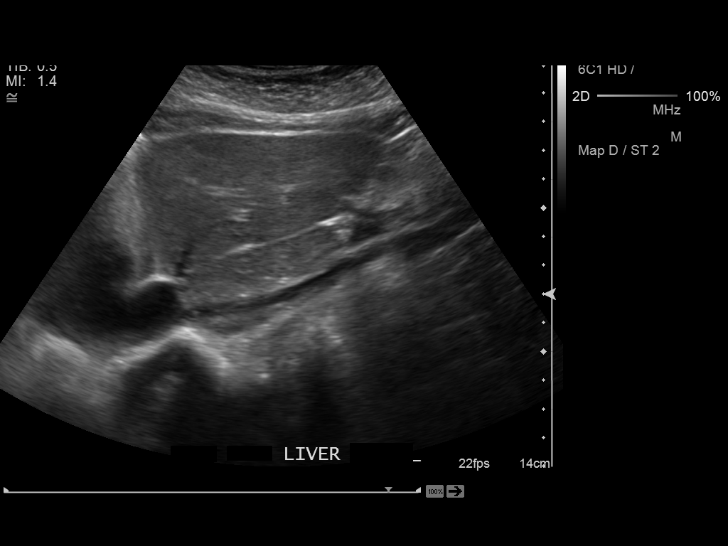
[im 11/41]
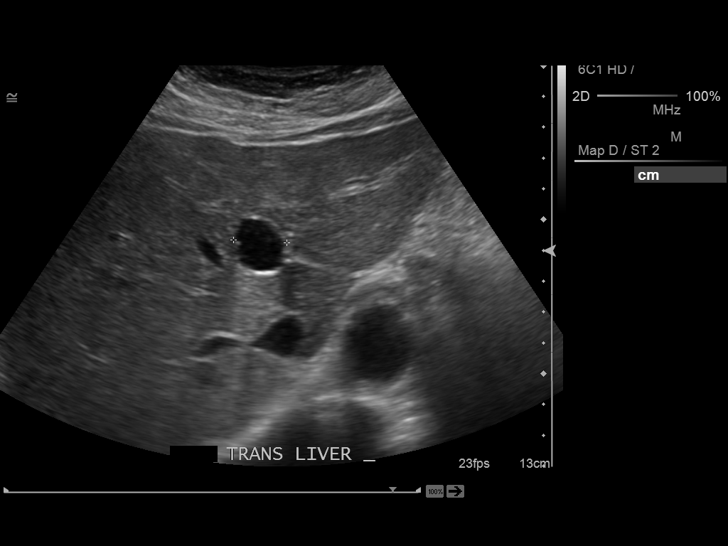
[im 14/41]
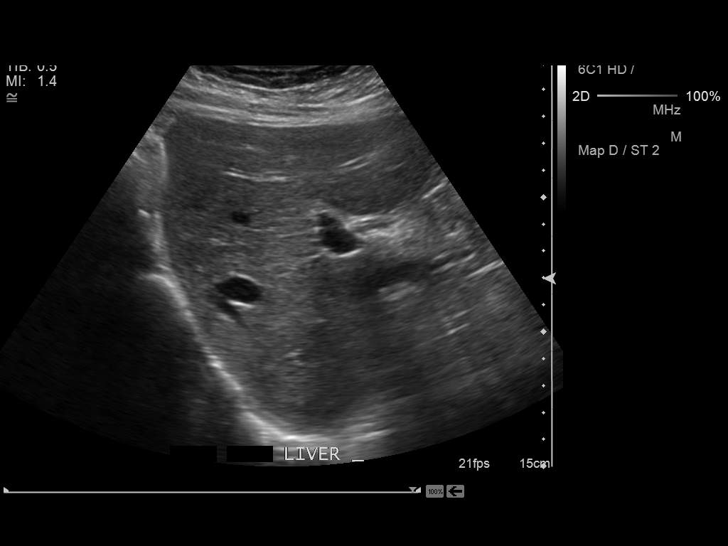
[im 16/41]
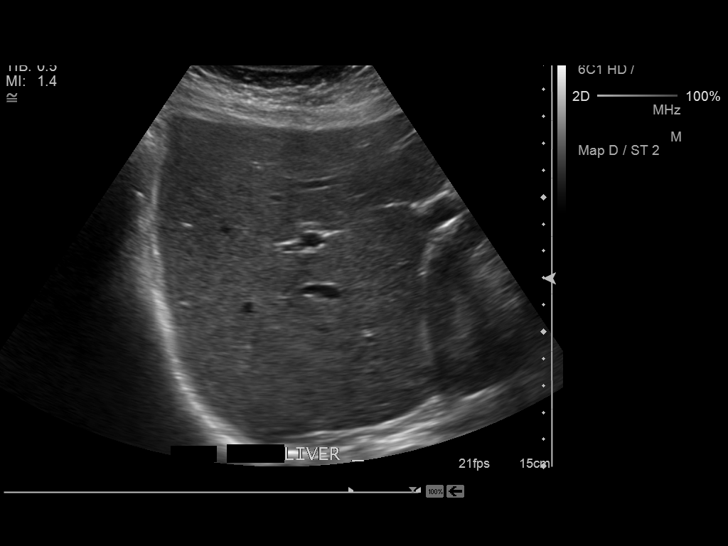
[im 19/41]
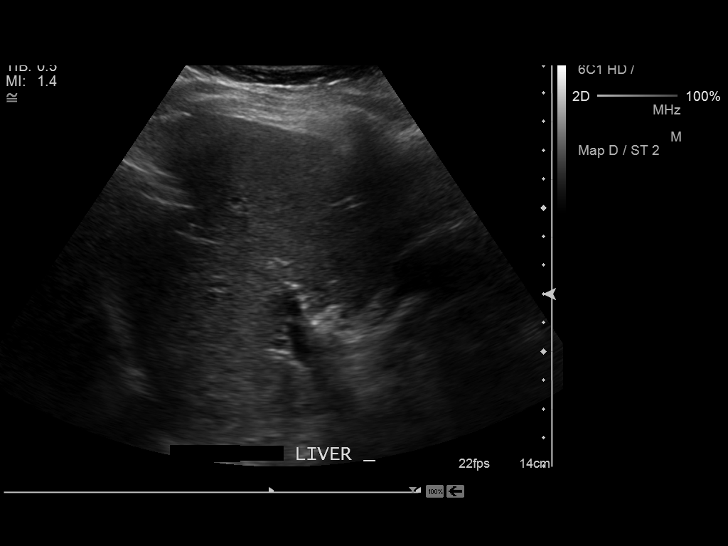
[im 22/41]
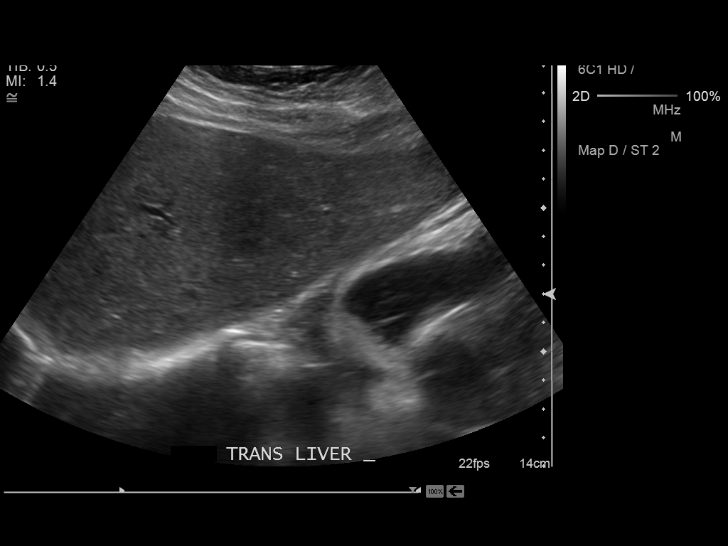
[im 26/41]
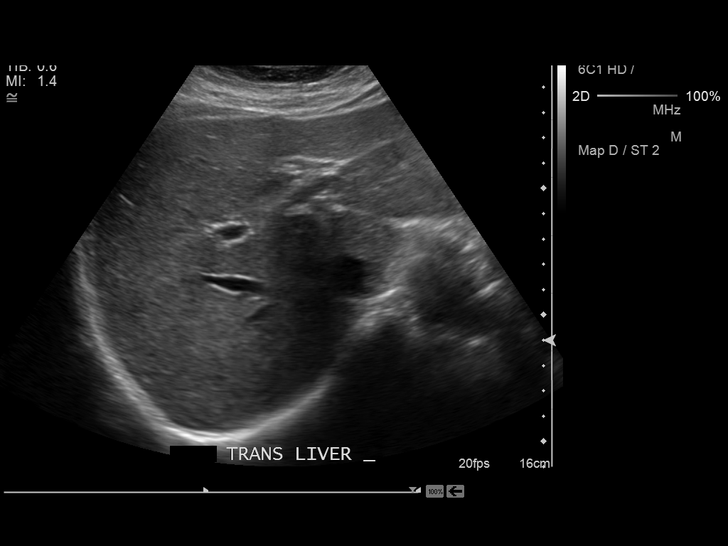
[im 27/41]
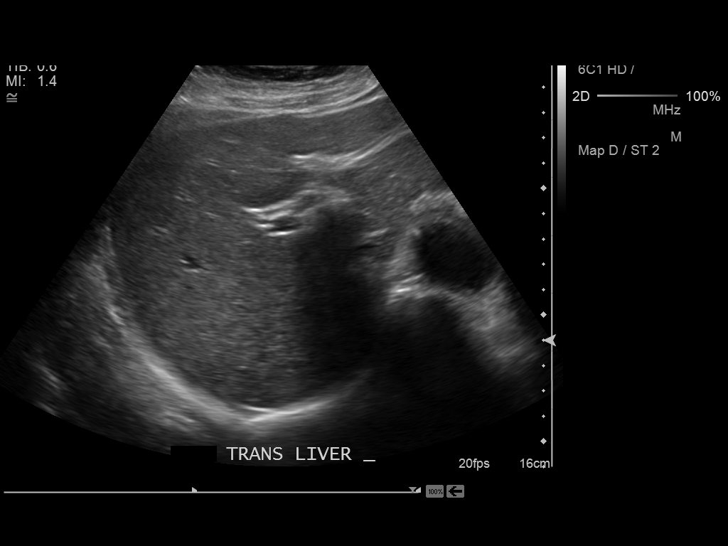
[im 31/41]
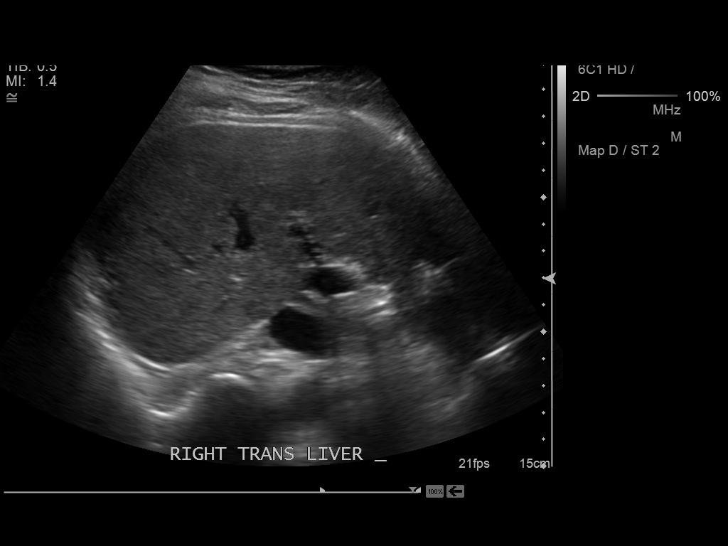
[im 34/41]
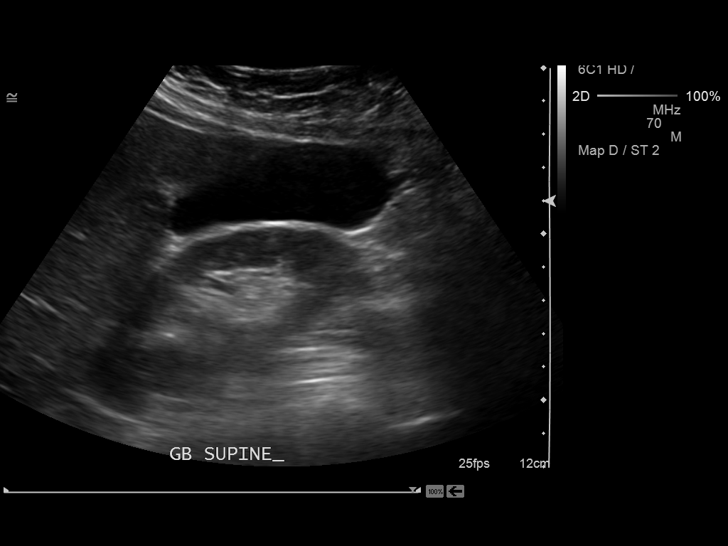
[im 37/41]
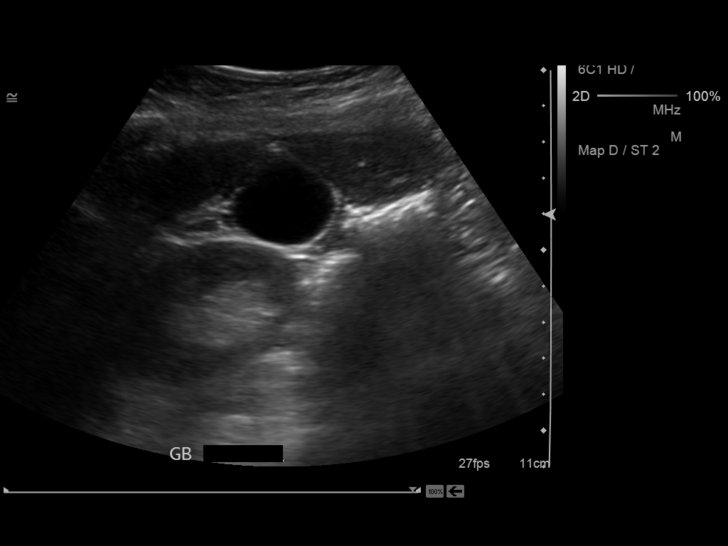
[im 41/41]
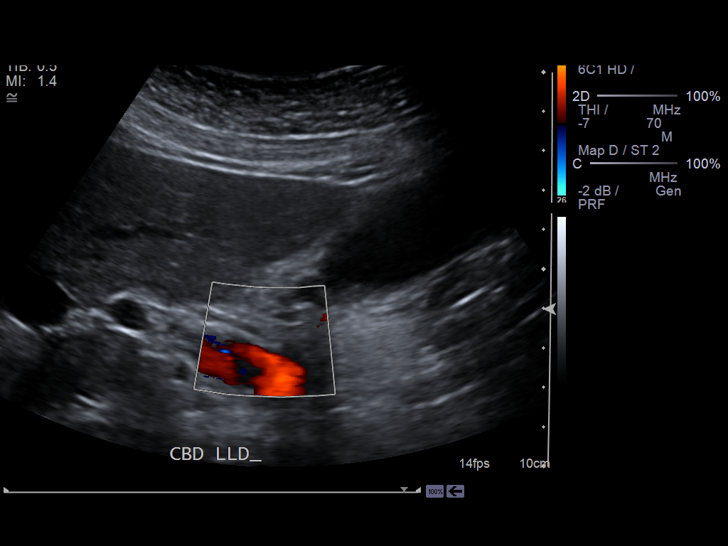

[14 of 25 positions shown; findings below may reference images not displayed]

FINDINGS: Gallbladder:

No gallstones or wall thickening visualized. No sonographic Murphy
sign noted.

Common bile duct:

Diameter: 2.4 mm

Liver:

17 x 15 x 17 mm simple cyst left lobe as seen on previous MRI with
no other abnormalities
IMPRESSION: No significant abnormalities

## 2014-02-16 ENCOUNTER — Ambulatory Visit: Payer: Managed Care, Other (non HMO)

## 2014-02-18 ENCOUNTER — Encounter: Payer: Self-pay | Admitting: Internal Medicine

## 2014-03-08 LAB — HM MAMMOGRAPHY

## 2014-03-19 ENCOUNTER — Other Ambulatory Visit: Payer: Self-pay | Admitting: Internal Medicine

## 2014-03-25 ENCOUNTER — Ambulatory Visit (INDEPENDENT_AMBULATORY_CARE_PROVIDER_SITE_OTHER): Payer: Managed Care, Other (non HMO) | Admitting: Internal Medicine

## 2014-03-25 ENCOUNTER — Encounter: Payer: Self-pay | Admitting: Internal Medicine

## 2014-03-25 VITALS — BP 120/72 | HR 95 | Temp 98.2°F | Wt 135.0 lb

## 2014-03-25 DIAGNOSIS — J449 Chronic obstructive pulmonary disease, unspecified: Secondary | ICD-10-CM

## 2014-03-25 MED ORDER — HYDROCODONE-HOMATROPINE 5-1.5 MG/5ML PO SYRP: 5.0000 mL | ORAL_SOLUTION | Freq: Three times a day (TID) | ORAL | Status: DC | PRN

## 2014-03-25 MED ORDER — PREDNISONE 5 MG PO TABS: 5.0000 mg | ORAL_TABLET | Freq: Every day | ORAL | Status: DC

## 2014-03-25 NOTE — Patient Instructions (Signed)
Cough, Adult  A cough is a reflex that helps clear your throat and airways. It can help heal the body or may be a reaction to an irritated airway. A cough may only last 2 or 3 weeks (acute) or may last more than 8 weeks (chronic).  CAUSES Acute cough:  Viral or bacterial infections. Chronic cough:  Infections.  Allergies.  Asthma.  Post-nasal drip.  Smoking.  Heartburn or acid reflux.  Some medicines.  Chronic lung problems (COPD).  Cancer. SYMPTOMS   Cough.  Fever.  Chest pain.  Increased breathing rate.  High-pitched whistling sound when breathing (wheezing).  Colored mucus that you cough up (sputum). TREATMENT   A bacterial cough may be treated with antibiotic medicine.  A viral cough must run its course and will not respond to antibiotics.  Your caregiver may recommend other treatments if you have a chronic cough. HOME CARE INSTRUCTIONS   Only take over-the-counter or prescription medicines for pain, discomfort, or fever as directed by your caregiver. Use cough suppressants only as directed by your caregiver.  Use a cold steam vaporizer or humidifier in your bedroom or home to help loosen secretions.  Sleep in a semi-upright position if your cough is worse at night.  Rest as needed.  Stop smoking if you smoke. SEEK IMMEDIATE MEDICAL CARE IF:   You have pus in your sputum.  Your cough starts to worsen.  You cannot control your cough with suppressants and are losing sleep.  You begin coughing up blood.  You have difficulty breathing.  You develop pain which is getting worse or is uncontrolled with medicine.  You have a fever. MAKE SURE YOU:   Understand these instructions.  Will watch your condition.  Will get help right away if you are not doing well or get worse. Document Released: 10/16/2010 Document Revised: 07/12/2011 Document Reviewed: 10/16/2010 ExitCare Patient Information 2015 [employer]. This information is not intended  to replace advice given to you by your health care provider. Make sure you discuss any questions you have with your health care provider.  

## 2014-03-25 NOTE — Progress Notes (Signed)
HPI  Pt presents to the clinic today with c/o cough and shortness of breath. She reports this started 4 days ago. The cough is un[productive. She becomes more short of breath with her coughing fits and exertion. She has been using her inhalers and nebulizers.  She has not tried anything else OTC. She is using her portable oxygen today. She recently finished a 30 days course of prednisone prescribed by Dr. Darrick Huntsmanullo for recurrent COPD exacerbations. She has an appt with Dr. Kendrick FriesMcQuaid at the end of December. She does continue to smoke.  Review of Systems      Past Medical History  Diagnosis Date  . COPD (chronic obstructive pulmonary disease)   . Hyperlipidemia   . Tobacco abuse   . Depression   . Anxiety   . Hypertension     Family History  Problem Relation Age of Onset  . Hypertension Mother   . Hypertension Father   . Stroke Father     History   Social History  . Marital Status: Married    Spouse Name: craig    Number of Children: N/A  . Years of Education: N/A   Occupational History  .     Social History Main Topics  . Smoking status: Former Smoker -- 0.10 packs/day for 40 years    Types: Cigarettes    Quit date: 11/19/2013  . Smokeless tobacco: Never Used  . Alcohol Use: No  . Drug Use: No  . Sexual Activity: Not on file   Other Topics Concern  . Not on file   Social History Narrative    Allergies  Allergen Reactions  . Aspirin   . Levofloxacin Nausea Only  . Penicillins      Constitutional:  Denies headache, fatigue, fever or abrupt weight changes.  HEENT: Denies eye redness, eye pain, pressure behind the eyes, facial pain, nasal congestion, ear pain, ringing in the ears, wax buildup, runny nose or sore throat. Respiratory: Positive cough and shortness of breath. Denies difficulty breathing.  Cardiovascular: Denies chest pain, chest tightness, palpitations or swelling in the hands or feet.   No other specific complaints in a complete review of systems  (except as listed in HPI above).  Objective:   BP 120/72 mmHg  Pulse 95  Temp(Src) 98.2 F (36.8 C) (Oral)  Wt 135 lb (61.236 kg)  SpO2 97% Wt Readings from Last 3 Encounters:  03/25/14 135 lb (61.236 kg)  01/23/14 131 lb 12 oz (59.761 kg)  01/21/14 128 lb 8 oz (58.287 kg)     General: Appears her stated age, chronically ill appearing in NAD. HEENT: Head: normal shape and size;  Ears: Tm's gray and intact, normal light reflex; Nose: mucosa pink and moist, septum midline; Throat/Mouth:Teeth present, mucosa erythematous and moist, no exudate noted, no lesions or ulcerations noted.  Cardiovascular: Normal rate and rhythm. Distant S1,S2 noted.  No murmur, rubs or gallops noted. Pulmonary/Chest: Normal effort and diminshed vesicular breath sounds. No respiratory distress. No wheezes, rales or ronchi noted.      Assessment & Plan:   Worsening COPD:  Does not appear to be an exacerbation- no indication for antibiotic Will try low dose prednisone for another month Continue your inhalers Keep your followup with Dr. Kendrick FriesMcQuaid. Rx for Hycodan cough syrup Work note to return Friday  RTC as needed or if symptoms persist.

## 2014-03-25 NOTE — Progress Notes (Signed)
Pre visit review using our clinic review tool, if applicable. No additional management support is needed unless otherwise documented below in the visit note. 

## 2014-03-26 ENCOUNTER — Other Ambulatory Visit: Payer: Self-pay | Admitting: Internal Medicine

## 2014-03-26 NOTE — Telephone Encounter (Signed)
Ok to refill,  printed rx  

## 2014-03-26 NOTE — Telephone Encounter (Signed)
Please advise refill? 

## 2014-03-30 ENCOUNTER — Other Ambulatory Visit: Payer: Self-pay | Admitting: Internal Medicine

## 2014-04-03 ENCOUNTER — Other Ambulatory Visit: Payer: Self-pay | Admitting: *Deleted

## 2014-04-03 MED ORDER — ALBUTEROL SULFATE (2.5 MG/3ML) 0.083% IN NEBU: INHALATION_SOLUTION | RESPIRATORY_TRACT | Status: DC

## 2014-04-05 ENCOUNTER — Ambulatory Visit (INDEPENDENT_AMBULATORY_CARE_PROVIDER_SITE_OTHER): Payer: Managed Care, Other (non HMO) | Admitting: Internal Medicine

## 2014-04-05 ENCOUNTER — Encounter: Payer: Self-pay | Admitting: Internal Medicine

## 2014-04-05 VITALS — BP 115/80 | HR 83 | Temp 97.6°F | Resp 16 | Ht 66.0 in | Wt 137.5 lb

## 2014-04-05 DIAGNOSIS — Z720 Tobacco use: Secondary | ICD-10-CM

## 2014-04-05 DIAGNOSIS — J441 Chronic obstructive pulmonary disease with (acute) exacerbation: Secondary | ICD-10-CM

## 2014-04-05 MED ORDER — DOXYCYCLINE HYCLATE 100 MG PO TABS: 100.0000 mg | ORAL_TABLET | Freq: Two times a day (BID) | ORAL | Status: DC

## 2014-04-05 MED ORDER — ESZOPICLONE 3 MG PO TABS: ORAL_TABLET | ORAL | Status: DC
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
## 2014-04-05 NOTE — Progress Notes (Signed)
Pre-visit discussion using our clinic review tool. No additional management support is needed unless otherwise documented below in the visit note.  

## 2014-04-05 NOTE — Progress Notes (Signed)
Ordered doxycycline as advised by verbal from MD. And refilled Eszopiclone as requested.

## 2014-04-06 ENCOUNTER — Encounter: Payer: Self-pay | Admitting: Internal Medicine

## 2014-04-06 NOTE — Assessment & Plan Note (Signed)
moderate to severe by March 2014 PFTS,  Responsive to beta agonist.  Exacerbations are becoming more frequent and taking longer to resolve. FMLA forms revised.

## 2014-04-06 NOTE — Assessment & Plan Note (Signed)
She has been tobacco free  since her September hospitalization

## 2014-04-23 ENCOUNTER — Encounter: Payer: Self-pay | Admitting: *Deleted

## 2014-04-29 ENCOUNTER — Encounter: Payer: Self-pay | Admitting: Nurse Practitioner

## 2014-04-29 ENCOUNTER — Ambulatory Visit (INDEPENDENT_AMBULATORY_CARE_PROVIDER_SITE_OTHER): Payer: Managed Care, Other (non HMO) | Admitting: Nurse Practitioner

## 2014-04-29 ENCOUNTER — Encounter: Payer: Self-pay | Admitting: Internal Medicine

## 2014-04-29 VITALS — BP 102/78 | HR 118 | Temp 98.4°F | Resp 16 | Ht 66.0 in | Wt 137.8 lb

## 2014-04-29 DIAGNOSIS — J449 Chronic obstructive pulmonary disease, unspecified: Secondary | ICD-10-CM

## 2014-04-29 DIAGNOSIS — J441 Chronic obstructive pulmonary disease with (acute) exacerbation: Secondary | ICD-10-CM

## 2014-04-29 MED ORDER — DOXYCYCLINE HYCLATE 100 MG PO TABS: 100.0000 mg | ORAL_TABLET | Freq: Two times a day (BID) | ORAL | Status: DC

## 2014-04-29 MED ORDER — PREDNISONE 5 MG PO TABS: 5.0000 mg | ORAL_TABLET | Freq: Every day | ORAL | Status: DC

## 2014-04-29 NOTE — Progress Notes (Signed)
Pre visit review using our clinic review tool, if applicable. No additional management support is needed unless otherwise documented below in the visit note. 
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
## 2014-04-29 NOTE — Assessment & Plan Note (Signed)
Wheezing was heard on exam. Will continue low dose of prednisone until she can see Dr. Kendrick FriesMcQuaid. Rx prednisone 5 mg and Doxycycline for 5 more days. Pt 94% on 2 L [place] today. Discussed when she needs to seek emergent care. Pt verbalized understanding.

## 2014-04-29 NOTE — Patient Instructions (Addendum)
Please rest these next 2 days.   Follow up with Dr. Kendrick FriesMcQuaid on 05/08/13.   If the antibiotics and prednisone are not helpful, oxygen drops when on O2 below 90 please seek emergent care.

## 2014-05-01 ENCOUNTER — Encounter: Payer: Self-pay | Admitting: Internal Medicine

## 2014-05-01 MED ORDER — PREDNISONE (PAK) 10 MG PO TABS: ORAL_TABLET | ORAL | Status: DC

## 2014-05-02 ENCOUNTER — Telehealth: Payer: Self-pay | Admitting: Internal Medicine

## 2014-05-02 ENCOUNTER — Telehealth: Payer: Self-pay

## 2014-05-02 NOTE — Telephone Encounter (Signed)
Please advise 

## 2014-05-02 NOTE — Telephone Encounter (Signed)
The patient is hoping to have her work note changed to go back to work on Monday ( 1/4)

## 2014-05-02 NOTE — Telephone Encounter (Signed)
Pt stopped by to pick letter and stated that she had sent Dr. Darrick Huntsmanullo a message. Pt stated to disregard email and the letter is good.msn

## 2014-05-04 ENCOUNTER — Other Ambulatory Visit: Payer: Self-pay | Admitting: Internal Medicine

## 2014-05-06 ENCOUNTER — Encounter: Payer: Self-pay | Admitting: Internal Medicine

## 2014-05-06 MED ORDER — CITALOPRAM HYDROBROMIDE 40 MG PO TABS: 40.0000 mg | ORAL_TABLET | Freq: Every day | ORAL | Status: DC

## 2014-05-08 ENCOUNTER — Ambulatory Visit: Payer: Managed Care, Other (non HMO) | Admitting: Pulmonary Disease

## 2014-05-08 ENCOUNTER — Encounter: Payer: Self-pay | Admitting: Pulmonary Disease

## 2014-05-08 ENCOUNTER — Ambulatory Visit: Payer: Self-pay | Admitting: Pulmonary Disease

## 2014-05-08 ENCOUNTER — Ambulatory Visit (INDEPENDENT_AMBULATORY_CARE_PROVIDER_SITE_OTHER): Payer: Managed Care, Other (non HMO) | Admitting: Pulmonary Disease

## 2014-05-08 VITALS — BP 106/70 | HR 92 | Ht 66.0 in | Wt 139.0 lb

## 2014-05-08 DIAGNOSIS — Z5181 Encounter for therapeutic drug level monitoring: Secondary | ICD-10-CM

## 2014-05-08 DIAGNOSIS — Z720 Tobacco use: Secondary | ICD-10-CM

## 2014-05-08 DIAGNOSIS — J9611 Chronic respiratory failure with hypoxia: Secondary | ICD-10-CM | POA: Insufficient documentation

## 2014-05-08 DIAGNOSIS — J432 Centrilobular emphysema: Secondary | ICD-10-CM

## 2014-05-08 LAB — HEPATIC FUNCTION PANEL A (ARMC)
ALK PHOS: 62 U/L
Albumin: 3.8 g/dL (ref 3.4–5.0)
BILIRUBIN TOTAL: 0.2 mg/dL (ref 0.2–1.0)
Bilirubin, Direct: <0.1 mg/dL (ref 0.0–0.2)
SGOT(AST): 7 U/L — ABNORMAL LOW (ref 15–37)
SGPT (ALT): 20 U/L
Total Protein: 6.7 g/dL (ref 6.4–8.2)

## 2014-05-08 MED ORDER — AZITHROMYCIN 250 MG PO TABS: 250.0000 mg | ORAL_TABLET | Freq: Every day | ORAL | Status: DC

## 2014-05-08 MED ORDER — PREDNISONE 10 MG PO TABS: 10.0000 mg | ORAL_TABLET | Freq: Every day | ORAL | Status: DC

## 2014-05-08 NOTE — Assessment & Plan Note (Signed)
Fortunately she quit smoking. She will need to have lung cancer screening this year at some point.

## 2014-05-08 NOTE — Progress Notes (Signed)
Subjective:    Patient ID: Diane [name], female    DOB: 1955-12-20, 59 y.o.   MRN: [id number]   Synopsis: Mrs. Sievers first saw the [facility] pulmonary clinic in July 2014 for COPD. She has had multiple hospitalizations and doctor visits for exacerbations of COPD. As of October of 2014 she continues to smoke cigarettes. A 2014 FEV1 was 55% predicted. In 2015 she continued to have multiple exacerbations and she was hospitalized in September. She was hospitalized for 9 days for a COPD exacerbation, during that time she was found to have sputum growing Pseudomonas. After that hospitalization she quit smoking and she started using nocturnal noninvasive mechanical ventilation with oxygen at home. She also uses oxygen on exertion.   HPI  Chief Complaint  Patient presents with  . Follow-up    pt c/o sob, cough, and hoarsness; she just completed a round of Prednisone by Dr. Darrick Huntsman.   Jakyiah was hospitalized over the fall for pneumonia and was hospitalized for 6 months.  She has since been treated for an exacerbation of COPD with prednisone by Dr. Darrick Huntsman a few weeks back. She continues to miss a lot of work due to the COPD exacerbations.  She has been placed on a home "ventilator" which she uses with oxygen for the last few months.  Today she completes a taper of prednisone. She has a lot of sinus drainage at night which is bad in the mornings.  She is no longer producing green mucus, it is white. She quit smoking in 01/2014.   Even though she finished the prednisone today she says she still has some chest tightness and shortness of breath.  Past Medical History  Diagnosis Date  . COPD (chronic obstructive pulmonary disease)   . Hyperlipidemia   . Tobacco abuse   . Depression   . Anxiety   . Hypertension      Review of Systems  Constitutional: Negative for fever, activity change and fatigue.  HENT: Negative for congestion, postnasal drip, rhinorrhea and sinus pressure.   Respiratory:  Positive for shortness of breath. Negative for cough and wheezing.   Cardiovascular: Negative for chest pain, palpitations and leg swelling.       Objective:   Physical Exam  Filed Vitals:   05/08/14 1412  BP: 106/70  Pulse: 92  Height:  (1.676 m)  Weight: 139 lb (63.05 kg)  SpO2: 95%  RA  Gen: no acute distress HEENT: NCAT, EOMi PULM: Diminished airflow bilaterally, no wheezing, few crackles in bases CV: RRR, no mgr AB: BS+, soft, nontender Ext: warm, no edema  February 2014 [facility]>> no pulmonary embolism, there is moderate to severe centrilobular emphysema in the upper lobes right greater than left bilaterally, no other market pulmonary abnormality March 2014 full pulmonary function testing at [facility]>> ratio 43%, FEV1 1.42 L (55% predicted), TLC 5.74 L (108% predicted), residual volume 2.46 L (125% predicted), DLCO 52% predicted, FEV1 improved more than 12% with a bronchodilator September 2015 chest x-ray images personally reviewed> no evidence of pulmonary parenchymal disease with the exception of emphysema, normal heart contour     Assessment & Plan:   GOLD GRADE D COPD Despite only having moderate airflow obstruction as measured in 2014 Nikoletta clearly has gold grade D COPD based on the multiple severe exacerbations of COPD she has experienced in the last year. This has caused significant limitation in her functional status and has limited her ability to work.  She is currently on oxygen which is a new development in the last several months. All of this is primarily due to the fact that she continued to smoke cigarettes up until recently.  I also explained to her today that in this situation I like to rule out underlying fungal disease or an atypical mycobacterial process but based on history I think the most likely explanation for her multiple severe exacerbations and the severity of her COPD is her heavy  tobacco abuse.  Daily use of azithromycin has been shown to reduce the incidence of COPD exacerbations so we will start that today. We will need to keep an eye on her EKG.  She has been started on nocturnal ventilation due to her chronic hypercapnic respiratory failure with COPD.  There are no large randomized controlled trials to support this practice but I don't think it is hurting anything.  Plan: -Check serum IgE as well as Aspergillus specific antigens  -check sputum culture for AFB and fungus -Start daily azithromycin, monitor EKG -EKG today -Follow-up in 6 weeks with EKG -Continue current inhaled therapy (Dulera and Spiriva) -continue prednisone taper for another ten days -We will apply for short-term disability for her as she has been hospitalized multiple times recently and in the foreseeable future her severe COPD will limit her ability to work. However, hopefully with pulmonary rehabilitation and daily azithromycin will help her get back on her feet and she can get back to work later this year.  Greater than 25 minutes were spent in direct consultation and examination with Dyesha today discussing the severity of her COPD and appropriate management  Tobacco abuse Fortunately she quit smoking. She will need to have lung cancer screening this year at some point.  Chronic hypoxemic respiratory failure Today on ambulation her to saturation dropped to 87% on room air, this improved with 2 L nasal cannula. She should continue using 2 L of oxygen with exertion and at bedtime.    Updated Medication List Outpatient Encounter Prescriptions as of 05/08/2014  Medication Sig  . albuterol (PROVENTIL) (2.5 MG/3ML) 0.083% nebulizer solution USE ONE VIAL IN NEBULIZER EVERY 6 HOURS AS NEEDED FOR WHEEZING  . albuterol (VENTOLIN HFA) 108 (90 BASE) MCG/ACT inhaler Inhale 2 puffs into the lungs 4 (four) times daily as needed for wheezing.  . Azelastine-Fluticasone (DYMISTA) 137-50 MCG/ACT SUSP Place  2 Squirts into the nose daily. In each nostril  . citalopram (CELEXA) 40 MG tablet Take 1 tablet (40 mg total) by mouth daily.  . clonazePAM (KLONOPIN) 0.5 MG tablet TAKE ONE TABLET BY MOUTH TWICE DAILY AS NEEDED FOR ANXIETY  . Eszopiclone 3 MG TABS TAKE ONE TABLET BY MOUTH AT BEDTIME. TAKE IMMEDIATELY BEFORE BEDTIME  . fexofenadine (ALLEGRA) 180 MG tablet Take 1 tablet (180 mg total) by mouth daily.  Marland Kitchen lisinopril (PRINIVIL,ZESTRIL) 10 MG tablet Take 1 tablet (10 mg total) by mouth daily.  . mometasone-formoterol (DULERA) 200-5 MCG/ACT AERO Inhale 2 puffs into the lungs 2 (two) times daily.  . montelukast (SINGULAIR) 10 MG tablet Take 1 tablet (10 mg total) by mouth at bedtime.  . Red Yeast Rice 600 MG CAPS Take by mouth 2 (two) times daily.   Marland Kitchen Spacer/Aero-Holding Chambers (AEROCHAMBER PLUS FLO-VU) MISC USE AS DIRECTED  . SPIRIVA RESPIMAT 2.5 MCG/ACT AERS INHALE TWO PUFFS ONCE DAILY  . traZODone (DESYREL) 50 MG tablet Take 50-100 mg by mouth at bedtime.  . VENTOLIN HFA 108 (90 BASE) MCG/ACT inhaler INHALE ONE PUFF 4 TIMES DAILY AS NEEDED  FOR DYSPNEA  . azithromycin (ZITHROMAX) 250 MG tablet Take 1 tablet (250 mg total) by mouth daily.  . benzonatate (TESSALON) 200 MG capsule Take 1 capsule (200 mg total) by mouth 3 (three) times daily as needed for cough. (Patient not taking: Reported on 05/08/2014)  . nystatin (MYCOSTATIN) 100000 UNIT/ML suspension Take 5 mLs (500,000 Units total) by mouth 4 (four) times daily. (Patient not taking: Reported on 05/08/2014)  . predniSONE (DELTASONE) 10 MG tablet Take 1 tablet (10 mg total) by mouth daily with breakfast.  . predniSONE (DELTASONE) 5 MG tablet Take 5 mg by mouth daily.  . [DISCONTINUED] doxycycline (VIBRA-TABS) 100 MG tablet Take 1 tablet (100 mg total) by mouth 2 (two) times daily. (Patient not taking: Reported on 05/08/2014)  . [DISCONTINUED] predniSONE (STERAPRED UNI-PAK) 10 MG tablet 6 tablets on Day 1 , then reduce by 1 tablet daily until gone  (Patient not taking: Reported on 05/08/2014)

## 2014-05-08 NOTE — Assessment & Plan Note (Addendum)
Despite only having moderate airflow obstruction as measured in 2014 Diane [name] clearly has gold grade D COPD based on the multiple severe exacerbations of COPD she has experienced in the last year. This has caused significant limitation in her functional status and has limited her ability to work. She is currently on oxygen which is a new development in the last several months. All of this is primarily due to the fact that she continued to smoke cigarettes up until recently.  I also explained to her today that in this situation I like to rule out underlying fungal disease or an atypical mycobacterial process but based on history I think the most likely explanation for her multiple severe exacerbations and the severity of her COPD is her heavy tobacco abuse.  Daily use of azithromycin has been shown to reduce the incidence of COPD exacerbations so we will start that today. We will need to keep an eye on her EKG.  She has been started on nocturnal ventilation due to her chronic hypercapnic respiratory failure with COPD.  There are no large randomized controlled trials to support this practice but I don't think it is hurting anything.  Plan: -Check serum IgE as well as Aspergillus specific antigens  -check sputum culture for AFB and fungus -Start daily azithromycin, monitor EKG -EKG today -Follow-up in 6 weeks with EKG -Continue current inhaled therapy (Dulera and Spiriva) -continue prednisone taper for another ten days -We will apply for short-term disability for her as she has been hospitalized multiple times recently and in the foreseeable future her severe COPD will limit her ability to work. However, hopefully with pulmonary rehabilitation and daily azithromycin will help her get back on her feet and she can get back to work later this year.  Greater than 25 minutes were spent in direct consultation and examination with Diane [name] today discussing the severity of her COPD and appropriate management

## 2014-05-08 NOTE — Patient Instructions (Addendum)
We will call you with the results of your blood work We will help you apply for short term disability Provide us with a sputum sample Take the azithromycin daily  We will see you back in 8 weeks or sooner if needed

## 2014-05-08 NOTE — Assessment & Plan Note (Signed)
Today on ambulation her to saturation dropped to 87% on room air, this improved with 2 L nasal cannula. She should continue using 2 L of oxygen with exertion and at bedtime.

## 2014-05-09 ENCOUNTER — Telehealth: Payer: Self-pay | Admitting: Emergency Medicine

## 2014-05-09 ENCOUNTER — Encounter: Payer: Self-pay | Admitting: Pulmonary Disease

## 2014-05-09 NOTE — Telephone Encounter (Signed)
Daliresp 500mg  daily

## 2014-05-09 NOTE — Telephone Encounter (Signed)
Pt has an allergy to Daliresp. Pt stated it makes her feel nauseas and "sick". Pt is fine with waiting till tomorrow (05/10/14) for recs from BQ. Pt stated she will try Daliresp again but would prefer another medication if possible.   BQ please advise.

## 2014-05-09 NOTE — Progress Notes (Signed)
Quick Note:  Pt aware of results. ______ 

## 2014-05-09 NOTE — Telephone Encounter (Signed)
-----   Message -----    From: [name],Trenton A    Sent: 05/09/2014 12:17 PM EST      To: Max FickleMCQUAID, DOUGLAS, MD Subject: Non-Urgent Medical Question  Pharmacists said I can not take azithromycin with celexa.Is there any other one that I can take.     Pt was scripted zpak on 05/08/14 and is taking Celexa and there is a drug to drug interaction of QT prolongation.

## 2014-05-09 NOTE — Telephone Encounter (Signed)
Phone note created d/t urgency. See phone note on 05/09/14.

## 2014-05-10 NOTE — Telephone Encounter (Signed)
Per BQ: there are no other alternatives as the only other suggested medication was denied by the pt.

## 2014-05-10 NOTE — Telephone Encounter (Signed)
Called and spoke to pt. Informed pt of the recs per BQ. Pt verbalized understanding and denied any further questions or concerns at this time.   

## 2014-05-11 LAB — EXPECTORATED SPUTUM ASSESSMENT W GRAM STAIN, RFLX TO RESP C

## 2014-05-12 ENCOUNTER — Encounter: Payer: Self-pay | Admitting: Pulmonary Disease

## 2014-05-13 NOTE — Telephone Encounter (Signed)
-----   Message ----- From: [name],Lynnae A Sent: 05/12/2014 8:52 AM EST To: Max FickleMCQUAID, DOUGLAS, MD Subject: Non-Urgent Medical Question  spoke with my HR dept. They said if I return to work on the 13th. That will stop my short term disability. Please advise on what to do. If I go back to work and get sick with in a month I will get points and possibly lose my job. I know I spoke with you about this when I was there but I thought their would be something out there I could take to help me with flare ups. ---------  Dr Kendrick FriesMcQuaid please advise. Thanks

## 2014-05-16 ENCOUNTER — Telehealth: Payer: Self-pay

## 2014-05-16 ENCOUNTER — Other Ambulatory Visit: Payer: Self-pay | Admitting: Internal Medicine

## 2014-05-16 MED ORDER — ALBUTEROL SULFATE HFA 108 (90 BASE) MCG/ACT IN AERS: 2.0000 | INHALATION_SPRAY | RESPIRATORY_TRACT | Status: DC | PRN

## 2014-05-16 NOTE — Telephone Encounter (Signed)
Please advise Dr. McQuaid thanks 

## 2014-05-16 NOTE — Telephone Encounter (Signed)
-----   Message from Lupita Leashouglas B McQuaid, MD sent at 05/16/2014  4:46 AM EST ----- A, Please let her know that her labwork was normal. However, [facility] sent the wrong aspergillus panel.  We need to have her get a serum IgE on the next visit. Thanks B

## 2014-05-16 NOTE — Telephone Encounter (Signed)
Pt aware of results.  Nothing further needed.  

## 2014-05-17 DIAGNOSIS — Z7689 Persons encountering health services in other specified circumstances: Secondary | ICD-10-CM

## 2014-05-19 ENCOUNTER — Telehealth: Payer: Self-pay | Admitting: Internal Medicine

## 2014-05-19 NOTE — Telephone Encounter (Signed)
FMLA form has been completed ad is in red folder.  i'm not sure what to cahrge since form did not have the top page with the charges on it for me to choose

## 2014-05-21 NOTE — Telephone Encounter (Signed)
Faxed FMLA and copied for billing and scan to chart.

## 2014-05-29 ENCOUNTER — Encounter: Payer: Self-pay | Admitting: Internal Medicine

## 2014-05-30 ENCOUNTER — Encounter: Payer: Self-pay | Admitting: Pulmonary Disease

## 2014-05-30 ENCOUNTER — Other Ambulatory Visit: Payer: Self-pay

## 2014-05-30 LAB — CULTURE, FUNGUS WITHOUT SMEAR

## 2014-05-30 MED ORDER — PREDNISONE 10 MG PO TABS: 10.0000 mg | ORAL_TABLET | Freq: Every day | ORAL | Status: DC

## 2014-05-31 MED ORDER — PREDNISONE 10 MG PO TABS: ORAL_TABLET | ORAL | Status: DC

## 2014-05-31 NOTE — Addendum Note (Signed)
Addended by: Boone MasterJONES, JESSICA E on: 05/31/2014 05:51 PM   Modules accepted: Orders

## 2014-05-31 NOTE — Telephone Encounter (Signed)
Per MW: Message     Since the weekend is upon us rec Prednisone 10 mg take 4 each am x 2 days,  2 each am x 2 days, 1 each am x 2 days and stop and be sure has f/u    Called spoke with patient and advised of MW's recommendations as stated above.  Pt voiced her understanding.  Rx sent to verified pharmacy.  Appt scheduled with BQ in [place] for 3.22.16 @ 4pm.  NOTE: pt did mention that she received a call from her pharmacy that her 10mg  QD rx was ready (refilled by Morrie SheldonAshley).  Pt would like to take the taper for her current symptoms.  Nothing further needed; will sign off.

## 2014-05-31 NOTE — Telephone Encounter (Signed)
1/28 mychart message from patient: Message     When will he be in [name]?        ----- Message -----    From: CMA Mindy S S    Sent: 05/30/14 3:55 PM    To: Diane [name]    Subject: RE: Non-Urgent Medical Question        If you are having problems again then we would need to see you back in the office for [name] visit. Give us [name] call to schedule an appointment. Thanks        ----- Message -----     From: Diane [name],Diane [name]     Sent: 05/30/2014 12:59 PM EST      To: Max FickleMCQUAID, DOUGLAS, MD    Subject: Non-Urgent Medical Question        I was seen by you on the sixth an you put me on 10 mg of prednisone. I am out now and I am starting to have problems with my breathing. what should I do.    Called spoke with patient to get more details on her symptoms.  Pt stated that while she is feeling better today, she has been having increased SOB and cough (mostly dry but occasionally w/ clear mucus) x3-4 days.  Sats have been good at 96%.  Denies any tightness, wheezing, f/c/s, n/v/d, hemoptysis, PND or leg swelling.  Pt has been wearing her CPAP, taking mucinex and has increased her Albuterol neb to every 4 hours.  At the 05/08/14 visit w/ BQ she was given #10 prednisone 10mg  Offered ov, but pt does not want to travel to [place] (she lives in [name]) -- BQ does not have any Donna openings until 2/22 Will forward to doc of the afternoon >> Dr Sherene SiresWert please advise, thank you.  Allergies  Allergen Reactions  . Aspirin   . Levofloxacin Nausea Only  . Penicillins   . Roflumilast     Other reaction(s): Unknown

## 2014-06-18 ENCOUNTER — Other Ambulatory Visit: Payer: Self-pay | Admitting: Internal Medicine

## 2014-06-26 ENCOUNTER — Encounter: Payer: Self-pay | Admitting: Internal Medicine

## 2014-06-27 ENCOUNTER — Other Ambulatory Visit: Payer: Self-pay | Admitting: Internal Medicine

## 2014-06-27 MED ORDER — BENZONATATE 200 MG PO CAPS: 200.0000 mg | ORAL_CAPSULE | Freq: Three times a day (TID) | ORAL | Status: DC | PRN

## 2014-06-27 MED ORDER — DOXYCYCLINE HYCLATE 100 MG PO CAPS: 100.0000 mg | ORAL_CAPSULE | Freq: Two times a day (BID) | ORAL | Status: DC

## 2014-06-27 MED ORDER — PREDNISONE 10 MG PO TABS: ORAL_TABLET | ORAL | Status: DC

## 2014-07-17 ENCOUNTER — Emergency Department: Payer: Self-pay | Admitting: Emergency Medicine

## 2014-07-17 ENCOUNTER — Telehealth: Payer: Self-pay | Admitting: Internal Medicine

## 2014-07-17 NOTE — Telephone Encounter (Signed)
fyi

## 2014-07-17 NOTE — Telephone Encounter (Signed)
Patient Name: Diane [name]  DOB: [birth year]    Initial Comment caller states she has coughing and wheezing   Nurse Assessment  Nurse: Scarlette ArStandifer, RN, Heather Date/Time (Eastern Time): 07/17/2014 10:29:12 AM  Confirm and document reason for call. If symptomatic, describe symptoms. ---caller states she has coughing and wheezing. She was seen on 02/25 and diagnosed with bronchitis and was given doxycycline and steroid dose pak and she got better, but about 4 days ago she started with coughing and wheezing again.  Has the patient traveled out of the country within the last 30 days? ---Not Applicable  Does the patient require triage? ---Yes  Related visit to physician within the last 2 weeks? ---No  Does the PT have any chronic conditions? (i.e. diabetes, asthma, etc.) ---Yes  List chronic conditions. ---COPD     Guidelines    Guideline Title Affirmed Question Affirmed Notes  Cough - Acute Productive Difficulty breathing    Final Disposition User   Go to ED Now Standifer, RN, Heather    Comments  Caller states that [facility], if not she will go to the ED.

## 2014-07-23 ENCOUNTER — Encounter: Payer: Self-pay | Admitting: Pulmonary Disease

## 2014-07-23 ENCOUNTER — Ambulatory Visit (INDEPENDENT_AMBULATORY_CARE_PROVIDER_SITE_OTHER): Payer: Managed Care, Other (non HMO) | Admitting: Pulmonary Disease

## 2014-07-23 VITALS — BP 134/72 | HR 85 | Ht 66.0 in | Wt 144.0 lb

## 2014-07-23 DIAGNOSIS — J432 Centrilobular emphysema: Secondary | ICD-10-CM

## 2014-07-23 DIAGNOSIS — R05 Cough: Secondary | ICD-10-CM

## 2014-07-23 DIAGNOSIS — R059 Cough, unspecified: Secondary | ICD-10-CM | POA: Insufficient documentation

## 2014-07-23 MED ORDER — TRAMADOL HCL 50 MG PO TABS: 50.0000 mg | ORAL_TABLET | Freq: Four times a day (QID) | ORAL | Status: DC | PRN

## 2014-07-23 MED ORDER — HYDROCOD POLST-CHLORPHEN POLST 10-8 MG/5ML PO LQCR: 5.0000 mL | Freq: Every evening | ORAL | Status: DC | PRN

## 2014-07-23 MED ORDER — ROFLUMILAST 500 MCG PO TABS: 500.0000 ug | ORAL_TABLET | Freq: Every day | ORAL | Status: DC

## 2014-07-23 MED ORDER — PREDNISONE 10 MG PO TABS: ORAL_TABLET | ORAL | Status: DC

## 2014-07-23 NOTE — Assessment & Plan Note (Signed)
Diane [name] is recovering from yet another exacerbation of COPD. Fortunately she has successfully quit smoking forever 6 months now. However, she continues to have recurrent exacerbations. In general though I feel that she looks much healthier than she has in the last several months in the further she gets away from cigarettes the better she feels overall.  She tells me that she does not believe that she has an allergy to Roflumilast. She really needs to be on something daily to prevent exacerbations. Her testing earlier this year did not show evidence of a fungal infection or for 1 and I trips and deficiency.  Plan: -Complete prednisone given in the emergency department, then take 5 more days of prednisone (I prescribed this) -Continue bronchodilator and inhaled corticosteroid regimen with do where a and Spiriva -Start Roflumilast 500 g daily -Continue efforts at daily exercise

## 2014-07-23 NOTE — Patient Instructions (Addendum)
Take 5 more days of prednisone after you complete what the ER gave you  For the cough: You need to try to suppress your cough to allow your larynx (voice box) to heal.  For three days don't talk, laugh, sing, or clear your throat. Do everything you can to suppress the cough during this time. Use hard candies (sugarless Jolly Ranchers) or non-mint or non-menthol containing cough drops during this time to soothe your throat.  Use a cough suppressant (Delsym or what I have prescribed you) around the clock during this time.  After three days, gradually increase the use of your voice and back off on the cough suppressants.  Take the daliresp daily  We will see you back in 3 months or sooner if needed

## 2014-07-23 NOTE — Progress Notes (Signed)
Subjective:    Patient ID: Diane [name], female    DOB: [birth year], 59 y.o.   MRN: [id number]   Synopsis: Diane [name] first saw the [facility] in July 2014 for COPD. She has had multiple hospitalizations and doctor visits for exacerbations of COPD. As of October of 2014 she continues to smoke cigarettes. A 2014 FEV1 was 55% predicted. In 2015 she continued to have multiple exacerbations and she was hospitalized in September. She was hospitalized for 9 days for a COPD exacerbation, during that time she was found to have sputum growing Pseudomonas. After that hospitalization she quit smoking and she started using nocturnal noninvasive mechanical ventilation with oxygen at home. She also uses oxygen on exertion.   HPI  Chief Complaint  Patient presents with  . Follow-up    Pt went to [facility] ED this past Wednesday for sob, coughing.  cxr ruled out pna.  Pt currently has a prod cough with white/brown mucus.     Diane [name] has been struggling lately with a lot of dyspnea and green sputum production.  She went to the ED last week and was given prednisone taper again, this after Dr. Darrick Huntsmanullo had prescribed doxycycline and prednisone a week or two before.  She is still coughing.  She notes that her son has been sick with bronchitis recently.  She says that the coughing spells make it to where she can't breathe.  She is still coughing up brown mucus from time to time.  She says that her cough is still pretty bad. She has been using the oxygen and the breathing machine at night.  She said her oxygen level at the hospital was OK.  Her dyspnea is much better now than it was a week ago.  However her dyspnea is worse at work.    Past Medical History  Diagnosis Date  . COPD (chronic obstructive pulmonary disease)   . Hyperlipidemia   . Tobacco abuse   . Depression   . Anxiety   . Hypertension      Review of Systems  Constitutional: Negative for fever, activity change and fatigue.   HENT: Negative for congestion, postnasal drip, rhinorrhea and sinus pressure.   Respiratory: Positive for shortness of breath. Negative for cough and wheezing.   Cardiovascular: Negative for chest [name], palpitations and leg swelling.       Objective:   Physical Exam  Filed Vitals:   07/23/14 1559  BP: 134/72  Pulse: 85  Height: 5\' 6"  (1.676 m)  Weight: 144 lb (65.318 kg)  SpO2: 96%  RA  Gen: no acute distress HEENT: NCAT, EOMi PULM: Slight wheezing bilaterally, good air movement CV: RRR, no mgr AB: BS+, soft, nontender Ext: warm, no edema  February 2014 [facility] no pulmonary embolism, there is moderate to severe centrilobular emphysema in the upper lobes right greater than left bilaterally, no other market pulmonary abnormality March 2014 full pulmonary function [facility] at [facility]>> ratio 43%, FEV1 1.42 L (55% predicted), TLC 5.74 L (108% predicted), residual volume 2.46 L (125% predicted), DLCO 52% predicted, FEV1 improved more than 12% with a bronchodilator September 2015 chest x-ray images personally reviewed> no evidence of pulmonary parenchymal disease with the exception of emphysema, normal heart contour     Assessment & Plan:   GOLD GRADE D COPD Diane [name] is recovering from yet another exacerbation of COPD. Fortunately she has successfully quit smoking forever 6 months now. However, she continues to have  recurrent exacerbations. In general though I feel that she looks much healthier than she has in the last several months in the further she gets away from cigarettes the better she feels overall.  She tells me that she does not believe that she has an allergy to Roflumilast. She really needs to be on something daily to prevent exacerbations. Her testing earlier this year did not show evidence of a fungal infection or for 1 and I trips and deficiency.  Plan: -Complete prednisone given in the emergency department,  then take 5 more days of prednisone (I prescribed this) -Continue bronchodilator and inhaled corticosteroid regimen with do where a and Spiriva -Start Roflumilast 500 g daily -Continue efforts at daily exercise   Cough She has a lingering cough after her recent exacerbations of COPD. It is primarily dry and likely due primarily to laryngeal irritation. She does not have significant acid reflux symptoms at this time. It is possible that the lisinopril is contributing but because this is an acute problem not going to stop lisinopril at this time.  Plan: -Voice rest was strongly encouraged excellent-test next use at night, tramadol to use during the daytime  -I advised her to seek 2-3 days of total voice rest to allow her vocal cords to heal -If no improvement may need to stop lisinopril     Updated Medication List Outpatient Encounter Prescriptions as of 07/23/2014  Medication Sig  . albuterol (PROVENTIL) (2.5 MG/3ML) 0.083% nebulizer solution USE ONE VIAL IN NEBULIZER EVERY 6 HOURS AS NEEDED FOR WHEEZING  . albuterol (VENTOLIN HFA) 108 (90 BASE) MCG/ACT inhaler Inhale 2 puffs into the lungs 4 (four) times daily as needed for wheezing.  . Azelastine-Fluticasone (DYMISTA) 137-50 MCG/ACT SUSP Place 2 Squirts into the nose daily. In each nostril  . benzonatate (TESSALON) 200 MG capsule Take 1 capsule (200 mg total) by mouth 3 (three) times daily as needed for cough.  . citalopram (CELEXA) 40 MG tablet Take 1 tablet (40 mg total) by mouth daily.  . clonazePAM (KLONOPIN) 0.5 MG tablet TAKE ONE TABLET BY MOUTH TWICE DAILY AS NEEDED FOR ANXIETY  . Eszopiclone 3 MG TABS TAKE ONE TABLET BY MOUTH AT BEDTIME. TAKE IMMEDIATELY BEFORE BEDTIME  . fexofenadine (ALLEGRA) 180 MG tablet Take 1 tablet (180 mg total) by mouth daily.  Marland Kitchen lisinopril (PRINIVIL,ZESTRIL) 10 MG tablet Take 1 tablet (10 mg total) by mouth daily.  . mometasone-formoterol (DULERA) 200-5 MCG/ACT AERO Inhale 2 puffs into the lungs 2  (two) times daily.  . montelukast (SINGULAIR) 10 MG tablet TAKE ONE TABLET BY MOUTH AT BEDTIME  . predniSONE (DELTASONE) 10 MG tablet Take 1 tablet (10 mg total) by mouth daily with breakfast. (Patient taking differently: Take 20 mg by mouth daily with breakfast. )  . Red Yeast Rice 600 MG CAPS Take by mouth 2 (two) times daily.   Marland Kitchen Spacer/Aero-Holding Chambers (AEROCHAMBER PLUS FLO-VU) MISC USE AS DIRECTED  . SPIRIVA RESPIMAT 2.5 MCG/ACT AERS INHALE TWO PUFFS ONCE DAILY  . traZODone (DESYREL) 50 MG tablet Take 50-100 mg by mouth at bedtime.  . chlorpheniramine-HYDROcodone (TUSSIONEX PENNKINETIC ER) 10-8 MG/5ML LQCR Take 5 mLs by mouth at bedtime as needed for cough.  . predniSONE (DELTASONE) 10 MG tablet Take  daily for 2 days, then  daily for three days  . roflumilast (DALIRESP) 500 MCG TABS tablet Take 1 tablet (500 mcg total) by mouth daily.  . traMADol (ULTRAM) 50 MG tablet Take 1 tablet (50 mg total) by mouth every 6 (six)  hours as needed (cough).  . [DISCONTINUED] albuterol (VENTOLIN HFA) 108 (90 BASE) MCG/ACT inhaler Inhale 2 puffs into the lungs every 4 (four) hours as needed for wheezing or shortness of breath.  . [DISCONTINUED] benzonatate (TESSALON) 200 MG capsule Take 1 capsule (200 mg total) by mouth 3 (three) times daily as needed for cough.  . [DISCONTINUED] doxycycline (VIBRAMYCIN) 100 MG capsule Take 1 capsule (100 mg total) by mouth 2 (two) times daily.  . [DISCONTINUED] nystatin (MYCOSTATIN) 100000 UNIT/ML suspension Take 5 mLs (500,000 Units total) by mouth 4 (four) times daily. (Patient not taking: Reported on 05/08/2014)  . [DISCONTINUED] predniSONE (DELTASONE) 10 MG tablet Take 4 tabs x 2 days, 2 tabs x 2 days, then 1 tab x 2 days and stop.  . [DISCONTINUED] predniSONE (DELTASONE) 10 MG tablet 6 tablets daily x 3 days,  Then taper by 10 mg daily until gone (8 days)  . [DISCONTINUED] predniSONE (DELTASONE) 5 MG tablet Take 5 mg by mouth daily.

## 2014-07-23 NOTE — Assessment & Plan Note (Signed)
She has a lingering cough after her recent exacerbations of COPD. It is primarily dry and likely due primarily to laryngeal irritation. She does not have significant acid reflux symptoms at this time. It is possible that the lisinopril is contributing but because this is an acute problem not going to stop lisinopril at this time.  Plan: -Voice rest was strongly encouraged excellent-test next use at night, tramadol to use during the daytime  -I advised her to seek 2-3 days of total voice rest to allow her vocal cords to heal -If no improvement may need to stop lisinopril

## 2014-07-24 ENCOUNTER — Other Ambulatory Visit: Payer: Self-pay | Admitting: Internal Medicine

## 2014-08-12 ENCOUNTER — Encounter: Payer: Self-pay | Admitting: Internal Medicine

## 2014-08-12 ENCOUNTER — Ambulatory Visit (INDEPENDENT_AMBULATORY_CARE_PROVIDER_SITE_OTHER): Payer: Managed Care, Other (non HMO) | Admitting: Internal Medicine

## 2014-08-12 VITALS — BP 128/70 | HR 90 | Temp 98.3°F | Resp 18 | Ht 66.0 in | Wt 140.8 lb

## 2014-08-12 DIAGNOSIS — R110 Nausea: Secondary | ICD-10-CM | POA: Diagnosis not present

## 2014-08-12 DIAGNOSIS — E871 Hypo-osmolality and hyponatremia: Secondary | ICD-10-CM

## 2014-08-12 DIAGNOSIS — R5383 Other fatigue: Secondary | ICD-10-CM | POA: Diagnosis not present

## 2014-08-12 MED ORDER — HYDROCOD POLST-CHLORPHEN POLST 10-8 MG/5ML PO LQCR: 5.0000 mL | Freq: Every evening | ORAL | Status: DC | PRN

## 2014-08-12 MED ORDER — ONDANSETRON 4 MG PO TBDP: 4.0000 mg | ORAL_TABLET | Freq: Three times a day (TID) | ORAL | Status: DC | PRN
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
## 2014-08-12 NOTE — Patient Instructions (Signed)
You may have a viral syndrome that will run its course in a few days,  Or you may have a UTI  I have refilled the tussionex and added zofran orally dissolving tablets for the nausea  I will contact you with the results

## 2014-08-12 NOTE — Progress Notes (Signed)
Pre-visit discussion using our clinic review tool. No additional management support is needed unless otherwise documented below in the visit note.  

## 2014-08-13 ENCOUNTER — Encounter: Payer: Self-pay | Admitting: Internal Medicine

## 2014-08-13 DIAGNOSIS — R110 Nausea: Secondary | ICD-10-CM | POA: Insufficient documentation

## 2014-08-13 LAB — COMPREHENSIVE METABOLIC PANEL
ALK PHOS: 50 U/L (ref 39–117)
ALT: 20 U/L (ref 0–35)
AST: 20 U/L (ref 0–37)
Albumin: 4.2 g/dL (ref 3.5–5.2)
BUN: 8 mg/dL (ref 6–23)
CO2: 27 mEq/L (ref 19–32)
Calcium: 9.5 mg/dL (ref 8.4–10.5)
Chloride: 97 mEq/L (ref 96–112)
Creatinine, Ser: 0.73 mg/dL (ref 0.40–1.20)
GFR: 87.11 mL/min (ref >60.00)
Glucose, Bld: 92 mg/dL (ref 70–99)
Potassium: 4.2 mEq/L (ref 3.5–5.1)
Sodium: 131 mEq/L — ABNORMAL LOW (ref 135–145)
Total Bilirubin: 0.4 mg/dL (ref 0.2–1.2)
Total Protein: 6.8 g/dL (ref 6.0–8.3)

## 2014-08-13 LAB — URINALYSIS, ROUTINE W REFLEX MICROSCOPIC
Bilirubin Urine: NEGATIVE
Ketones, ur: NEGATIVE
Leukocytes, UA: NEGATIVE
Nitrite: NEGATIVE
RBC / HPF: NONE SEEN (ref 0–?)
Specific Gravity, Urine: <=1.005 — AB (ref 1.000–1.030)
TOTAL PROTEIN, URINE-UPE24: NEGATIVE
URINE GLUCOSE: NEGATIVE
UROBILINOGEN UA: 0.2 (ref 0.0–1.0)
WBC UA: NONE SEEN (ref 0–?)
pH: 5.5 (ref 5.0–8.0)

## 2014-08-13 LAB — CBC WITH DIFFERENTIAL/PLATELET
BASOS ABS: 0 10*3/uL (ref 0.0–0.1)
Basophils Relative: 0.8 % (ref 0.0–3.0)
EOS PCT: 0.8 % (ref 0.0–5.0)
Eosinophils Absolute: 0 10*3/uL (ref 0.0–0.7)
HCT: 42.2 % (ref 36.0–46.0)
Hemoglobin: 14.4 g/dL (ref 12.0–15.0)
LYMPHS PCT: 32.7 % (ref 12.0–46.0)
Lymphs Abs: 1.9 10*3/uL (ref 0.7–4.0)
MCHC: 34.1 g/dL (ref 30.0–36.0)
MCV: 88.7 fl (ref 78.0–100.0)
Monocytes Absolute: 0.5 10*3/uL (ref 0.1–1.0)
Monocytes Relative: 8.7 % (ref 3.0–12.0)
NEUTROS PCT: 57 % (ref 43.0–77.0)
Neutro Abs: 3.4 10*3/uL (ref 1.4–7.7)
Platelets: 299 10*3/uL (ref 150.0–400.0)
RBC: 4.76 Mil/uL (ref 3.87–5.11)
RDW: 13.6 % (ref 11.5–15.5)
WBC: 5.9 10*3/uL (ref 4.0–10.5)

## 2014-08-13 LAB — LIPASE: LIPASE: 19 U/L (ref 11.0–59.0)

## 2014-08-13 NOTE — Assessment & Plan Note (Signed)
Recurrent, Secondary to COPD

## 2014-08-13 NOTE — Assessment & Plan Note (Addendum)
Liver enzymes and lipase are normal, as is her UA.  She has mild hyponatremia and may be mildly dehydrated.  May be a side effect of Daliresp.   Urine culture was positive,  Will rx cipro  mychart message sent

## 2014-08-14 ENCOUNTER — Encounter: Payer: Self-pay | Admitting: Internal Medicine

## 2014-08-16 ENCOUNTER — Encounter: Payer: Self-pay | Admitting: Internal Medicine

## 2014-08-16 LAB — URINE CULTURE: Colony Count: >=100000

## 2014-08-16 MED ORDER — CIPROFLOXACIN HCL 250 MG PO TABS: 250.0000 mg | ORAL_TABLET | Freq: Two times a day (BID) | ORAL | Status: DC

## 2014-08-16 NOTE — Addendum Note (Signed)
Addended by: Sherlene ShamsULLO, Radhika Dershem L on: 08/16/2014 07:19 AM   Modules accepted: Orders

## 2014-08-19 ENCOUNTER — Encounter: Payer: Self-pay | Admitting: Nurse Practitioner

## 2014-08-19 ENCOUNTER — Ambulatory Visit (INDEPENDENT_AMBULATORY_CARE_PROVIDER_SITE_OTHER): Payer: Managed Care, Other (non HMO) | Admitting: Nurse Practitioner

## 2014-08-19 VITALS — BP 132/86 | HR 110 | Temp 98.1°F | Resp 16 | Ht 66.0 in | Wt 137.8 lb

## 2014-08-19 DIAGNOSIS — J441 Chronic obstructive pulmonary disease with (acute) exacerbation: Secondary | ICD-10-CM | POA: Diagnosis not present

## 2014-08-19 DIAGNOSIS — R000 Tachycardia, unspecified: Secondary | ICD-10-CM

## 2014-08-19 DIAGNOSIS — R0602 Shortness of breath: Secondary | ICD-10-CM | POA: Diagnosis not present

## 2014-08-19 DIAGNOSIS — E871 Hypo-osmolality and hyponatremia: Secondary | ICD-10-CM | POA: Diagnosis not present

## 2014-08-19 DIAGNOSIS — R110 Nausea: Secondary | ICD-10-CM

## 2014-08-19 MED ORDER — SULFAMETHOXAZOLE-TRIMETHOPRIM 800-160 MG PO TABS: 1.0000 | ORAL_TABLET | Freq: Two times a day (BID) | ORAL | Status: DC

## 2014-08-19 MED ORDER — ALBUTEROL SULFATE (2.5 MG/3ML) 0.083% IN NEBU: INHALATION_SOLUTION | RESPIRATORY_TRACT | Status: DC

## 2014-08-19 NOTE — Telephone Encounter (Signed)
Patient has been scheduled to see NP at 2 PM. Patient crying on phone she feels so bad. We have nothing available until after 5. Patient wanted to be seen quicker but refused ER, Sent message also to NP so she would be aware.

## 2014-08-19 NOTE — Assessment & Plan Note (Signed)
Pt not taking anxiety medications due to her being on Cipro she reports. EKG shows rightward P/QRS axis and rotation-possibly due to pulmonary disease.

## 2014-08-19 NOTE — Assessment & Plan Note (Signed)
Stop Cipro and start Bactrim DS tomorrow. Cipro maybe exacerbating the nausea since that is what Levofloxacin's allergy is.

## 2014-08-19 NOTE — Assessment & Plan Note (Signed)
EKG today was unchanged from Jan. 2016's except for tachycardia. Probable dehydration from not wanting to eat/drink. Encouraged gatorade intake and repeat BMET today.

## 2014-08-19 NOTE — Patient Instructions (Addendum)
Stop the cipro!   Start the Bactrim DS (two a day for 2 days)   Call us if worse.   Try to eat bland foods, nausea may stop after stopping Cipro.

## 2014-08-19 NOTE — Progress Notes (Signed)
Pre visit review using our clinic review tool, if applicable. No additional management support is needed unless otherwise documented below in the visit note. 
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
## 2014-08-20 ENCOUNTER — Encounter: Payer: Self-pay | Admitting: Nurse Practitioner

## 2014-08-20 LAB — BASIC METABOLIC PANEL
BUN: 4 mg/dL — AB (ref 6–23)
CALCIUM: 10 mg/dL (ref 8.4–10.5)
CO2: 24 meq/L (ref 19–32)
CREATININE: 0.68 mg/dL (ref 0.40–1.20)
Chloride: 98 mEq/L (ref 96–112)
GFR: 94.54 mL/min (ref >60.00)
Glucose, Bld: 74 mg/dL (ref 70–99)
Potassium: 4.2 mEq/L (ref 3.5–5.1)
Sodium: 131 mEq/L — ABNORMAL LOW (ref 135–145)

## 2014-08-22 ENCOUNTER — Telehealth: Payer: Self-pay | Admitting: Internal Medicine

## 2014-08-22 ENCOUNTER — Encounter: Payer: Self-pay | Admitting: Internal Medicine

## 2014-08-22 ENCOUNTER — Inpatient Hospital Stay
Admit: 2014-08-22 | Disposition: A | Discharge status: Home / Self Care | Payer: Self-pay | Attending: Internal Medicine | Admitting: Internal Medicine

## 2014-08-22 LAB — URINALYSIS, COMPLETE
BILIRUBIN, UR: NEGATIVE
GLUCOSE, UR: NEGATIVE mg/dL (ref 0–75)
KETONE: NEGATIVE
Leukocyte Esterase: NEGATIVE
Nitrite: NEGATIVE
PROTEIN: NEGATIVE
Ph: 6 (ref 4.5–8.0)
SPECIFIC GRAVITY: 1.003 (ref 1.003–1.030)

## 2014-08-22 LAB — CBC
HCT: 43.1 % (ref 35.0–47.0)
HGB: 14.4 g/dL (ref 12.0–16.0)
MCH: 30.1 pg (ref 26.0–34.0)
MCHC: 33.4 g/dL (ref 32.0–36.0)
MCV: 90 fL (ref 80–100)
Platelet: 373 10*3/uL (ref 150–440)
RBC: 4.77 10*6/uL (ref 3.80–5.20)
RDW: 13.7 % (ref 11.5–14.5)
WBC: 7.5 10*3/uL (ref 3.6–11.0)

## 2014-08-22 LAB — COMPREHENSIVE METABOLIC PANEL
ANION GAP: 7 (ref 7–16)
Albumin: 4.5 g/dL
Alkaline Phosphatase: 56 U/L
Bilirubin,Total: 0.6 mg/dL
CALCIUM: 9.7 mg/dL
Chloride: 100 mmol/L — ABNORMAL LOW
Co2: 29 mmol/L
Creatinine: 0.85 mg/dL
EGFR (Non-African Amer.): >60
GLUCOSE: 106 mg/dL — AB
POTASSIUM: 4.4 mmol/L
SGOT(AST): 31 U/L
SGPT (ALT): 28 U/L
Sodium: 136 mmol/L
TOTAL PROTEIN: 7.6 g/dL

## 2014-08-22 LAB — TSH: Thyroid Stimulating Horm: 1.013 u[IU]/mL

## 2014-08-22 LAB — TROPONIN I
Troponin-I: <0.03 ng/mL
Troponin-I: <0.03 ng/mL
Troponin-I: <0.03 ng/mL

## 2014-08-22 NOTE — Telephone Encounter (Signed)
Was wanting to get in contact with you Olegario MessierKathy i didn't want to interrupt with your patients.

## 2014-08-22 NOTE — Telephone Encounter (Signed)
Patient called and stated she is still having difficulty breathing SOB, even with CPAP and 02. Patient stated 02 sat @ 91 pulse 114 notified MD and NP verbal given for patient to go to ER now. Patient immediately advised and patient stated she would go to ER now.

## 2014-08-23 LAB — CBC WITH DIFFERENTIAL/PLATELET
Basophil #: 0 10*3/uL (ref 0.0–0.1)
Basophil %: 0 %
EOS ABS: 0 10*3/uL (ref 0.0–0.7)
Eosinophil %: 0 %
HCT: 38.8 % (ref 35.0–47.0)
HGB: 12.7 g/dL (ref 12.0–16.0)
LYMPHS ABS: 0.3 10*3/uL — AB (ref 1.0–3.6)
Lymphocyte %: 8.7 %
MCH: 29.6 pg (ref 26.0–34.0)
MCHC: 32.8 g/dL (ref 32.0–36.0)
MCV: 90 fL (ref 80–100)
Monocyte #: 0 x10 3/mm — ABNORMAL LOW (ref 0.2–0.9)
Monocyte %: 0.9 %
NEUTROS ABS: 3.6 10*3/uL (ref 1.4–6.5)
Neutrophil %: 90.4 %
Platelet: 323 10*3/uL (ref 150–440)
RBC: 4.3 10*6/uL (ref 3.80–5.20)
RDW: 13.7 % (ref 11.5–14.5)
WBC: 3.9 10*3/uL (ref 3.6–11.0)

## 2014-08-23 LAB — COMPREHENSIVE METABOLIC PANEL
ALK PHOS: 46 U/L
AST: 25 U/L
Albumin: 3.7 g/dL
Anion Gap: 10 (ref 7–16)
BILIRUBIN TOTAL: 0.5 mg/dL
BUN: 6 mg/dL
CALCIUM: 9 mg/dL
CO2: 24 mmol/L
Chloride: 102 mmol/L
Creatinine: 0.69 mg/dL
EGFR (African American): >60
Glucose: 130 mg/dL — ABNORMAL HIGH
Potassium: 4 mmol/L
SGPT (ALT): 22 U/L
SODIUM: 136 mmol/L
Total Protein: 6.3 g/dL — ABNORMAL LOW

## 2014-08-23 LAB — HEMOGLOBIN A1C: Hemoglobin A1C: 5.4 %

## 2014-08-23 NOTE — Discharge Summary (Signed)
PATIENT NAME:  Diane [name], Diane [name] MR#:  [id number] DATE OF BIRTH:  [birth year]  DATE OF ADMISSION:  06/12/2012 DATE OF DISCHARGE:  06/20/2012  REASON FOR ADMISSION: Shortness of breath.   DISCHARGE DIAGNOSES:  1.  Chronic obstructive pulmonary disease exacerbation.  2.  Adenoma of adrenal glands.  3.  Acute respiratory failure requiring BiPAP.  4.  Tobacco abuse.  5.  Hyponatremia.  6.  Hypertension.  7.  Depression and anxiety.  8.  Constipation.  9.  Hypokalemia, also resolved.   IMPORTANT LABORATORY, RADIOLOGICAL AND DIAGNOSTIC RESULTS: Glucose 125, sodium 127 on admission, 135 at discharge, potassium 3.2 on admission, 3.9 at discharge. White count 11.2 at discharge after steroids, on admission 8.8. [name] pH of 7.35, pCO2 of 49, pO2 of 236, and that is on BiPAP. EKG: Sinus tachycardia. Chest x-ray: No acute abnormalities. MRI of the abdomen in evaluation for possible adrenal adenoma: Left adrenal nodule is an adrenal adenoma; however, it is not definitive secondary to patient motion artifact. Repeat MRI later on if necessary. The CT scan also suggested adrenal adenoma, for what it is suspicious of it. Recommended followup in 6 months to [name] year.   DISPOSITION: Home.   MEDICATIONS AT DISCHARGE:  1.  Albuterol CSF 2 puffs 4 times [name] day. 2.  Celexa 20 mg once daily. 3.  Spiriva 18 mcg once daily. 4.  Symbicort 160/4.5 twice daily. 5.  Albuterol 0.63 nebulizers. 6.  Prednisone 20 mg tablets, taper starting from 40 mg for [name] slow taper for 9 days.  7.  Lisinopril 10 mg twice daily. 8.  Amlodipine 10 mg once [name] day. 9.  Guaifenesin 600 mg every 12 hours. 10.  Azithromycin 500 mg for 7 more days. 11.  Ipratropium for nebulizers. 12.  Oxygen 2 liters nasal cannula. The patient is discharged on 2 liters nasal cannula of oxygen. The patient is to follow up with Dr. Duncan Dulleresa Tullo for continuation of oxygen therapy and followup on the patient.  13.  Nebulizer machine, use as directed.  14.  The patient  is indicated to stop hydrochlorothiazide, as she was having hyponatremia.   HOSPITAL COURSE: The patient is [name] very nice 59 year old female who has [name] history of smoking, current smoking, COPD and hypertension. She comes on 06/12/2012 complaining of shortness of breath at work, having shortness of breath for 3 to 4 days. Tried nebulizer, rescue inhalers without any relief. The patient felt worse. When she came to the ER via EMS, her oxygen saturation was 86% on room air. She was very tachycardic with [name] heart rate of 120. She received 1 dose of magnesium, Solu-Medrol, and she was put on [name] BiPAP. After [name] while being on BiPAP, she started feeling [name] little bit better. The patient was admitted to the floor. She was treated with steroids. She was treated with azithromycin as an antibiotic only for COPD exacerbation. Due to her hyponatremia, she was taken off hydrochlorothiazide. As far as problems go:  1.  Respiratory failure: The patient developed respiratory failure due to COPD exacerbation. She was put on BiPAP. Her oxygen saturation was around 86% to 88%, started to improve. Likely this was just [name] pure COPD exacerbation, although she was put on azithromycin due to anti-inflammatory properties of this medication as well as [name] possible mild infection. She was asked to continue this medication for treatment for 7 more days.  2.  As far as her tobacco abuse, she was consulted. She stated that she would like to try to  quit smoking. She was referred to her primary care physician for further treatment.  3.  Hyponatremia: The patient was taken off hydrochlorothiazide. Sodium normalized.  4.  Hypertension: We increased the dose of lisinopril and put her on Norvasc and her blood pressure improved.  5.  Depression/anxiety was stable.  6.  The patient was constipated, but she improved pretty well.  7.  She also had [name] finding of the adrenal adenoma for what we recommend followup in 6 months to 1 year as an outpatient.    DISCHARGE CONDITION: The patient is discharged in good condition.   TIME SPENT: I spent about 40 minutes with discharge of this patient on discharge date.    ____________________________ Felipa Furnace, MD rsg:jm D: 06/23/2012 19:48:00 ET T: 06/24/2012 15:09:23 ET JOB#: [id number]  cc: Felipa Furnace, MD, <Dictator> Duncan Dull, MD Pearletha Furl MD ELECTRONICALLY SIGNED 06/24/2012 23:06

## 2014-08-23 NOTE — H&P (Signed)
PATIENT NAME:  Diane [name], Diane [name] MR#:  [id number] DATE OF BIRTH:  June 09, 1955  DATE OF ADMISSION:  06/12/2012  PRIMARY CARE PHYSICIAN: Dr. Darrick Huntsman.   EMERGENCY ROOM PHYSICIAN:  Dr. Cyril Loosen.  CHIEF COMPLAINT: Shortness of breath.   HISTORY OF PRESENT ILLNESS: The patient is a 59 year old female who has COPD, who was having shortness of breath at work, and the patient has been having shortness of breath for the last 3 or 4 days.  She tried nebulizer, and the patient  rescue inhalers at home with no relief. Today, it got so worse that she could not breathe anymore. She was at work and was in tripod position. Brought in by EMS. O2 sats were 86% on room air, and tachycardic at 120. The patient received 1 dose of magnesium 2 grams, and also Solu-Medrol 60 mg received, and started on BiPAP 10/10 by the ER physician. The patient was seen by me. She says that she is a little better, and the ER physician had taken off the BiPAP after an hour, and the patient started to have tachypnea and hypertension with saturations going down to 93%, so we started back the BiPAP for her. The patient is still wheezing and is going to be admitted for COPD exacerbation.   PAST MEDICAL HISTORY: Significant for hypertension, COPD.   ALLERGIES:  PENICILLIN, LEVAQUIN AND ALSO ASPIRIN.   SOCIAL HISTORY:  She is smoking about 1/2 pack per day. She started to smoke at around 17. No alcohol. No drugs.   PAST SURGICAL HISTORY: Tubal ligation.   FAMILY HISTORY: Significant for hypertension.   MEDICATIONS: The patient takes albuterol inhalers 90 mcg 4 times daily as needed, Celexa 20 mg daily, HCTZ 25 mg p.o. daily, Spiriva 18 mcg inhalation daily, Symbicort 160/4.5 mcg inhalation b.i.d.   REVIEW OF SYSTEMS:    CONSTITUTIONAL: Has fatigue at this time because of trouble breathing.  EYES: No blurred vision.  EARS, NOSE, THROAT: No tinnitus. No epistaxis. No difficulty swallowing.  GASTROINTESTINAL: No nausea. No vomiting.   GENITOURINARY: No dysuria.  MUSCULOSKELETAL: No joint pain.  SKIN: No skin rashes.  NEUROLOGIC: No headache. No TIAs. CARDIOVASCULAR: No chest pain.  PULMONARY: The patient does have trouble breathing for the past 3 or 4 days with wheezing.   PHYSICAL EXAMINATION: VITAL SIGNS: Temperature is 98.6, heart rate 115, blood pressure 182/98. Sats 99% on BiPAP. She is right now on 10/5 with 35% FiO2.  GENERAL:  Alert, awake, oriented, not in distress.  HEAD AND EYES: Head atraumatic, normocephalic. Pupils equally reacting to light. Extraocular movements intact.  EAR, NOSE, THROAT:  No tympanic membrane congestion. No turbinate hypertrophy. No oropharyngeal erythema.  NECK:  Normal range of motion. No JVD.  CARDIOVASCULAR: S1, S2 regular, tachycardic.  LUNGS: Bilateral expiratory wheeze in all lung fields.  ABDOMEN: Soft, nontender, nondistended. Bowel sounds present.  EXTREMITIES: No edema. No cyanosis. No clubbing.  NEUROLOGIC: No focal deficit.  Alert, awake, oriented x 3. Sensations are intact.  PSYCHIATRIC:  Mood and affect are within normal limits.   RADIOGRAPHIC AND LABORATORY DATA:  The patient's WBC is 8.8, hemoglobin 14.4, hematocrit 42.8, platelets 290. Electrolytes:  Sodium 127, potassium 3.2, chloride 93, bicarb 24, BUN 7, creatinine 0.64, glucose 125. Troponin less than 0.02. BNP 63. Chest x-ray showed no acute abnormality. Lungs are clear.   ASSESSMENT AND PLAN:   1.  The patient is a 59 year old female patient with acute respiratory failure secondary to chronic obstructive pulmonary disease exacerbation. The patient is going to  be started on BiPAP again and we will admit to CCU stepdown. Continue Solu-Medrol 60 mg q.8 hours, along with _____ q.4 hours.  THE PATIENT IS ALLERGIC TO PENICILLINS AND ALSO LEVAQUIN, so will put her on vancomycin and Zithromax. Follow the blood cultures. If patient can give a sputum culture, we will do sputum cultures.  2.  Hyponatremia and hypokalemia, is  likely secondary to dehydration. The patient is not edematous and BNP also is normal. We will hydrate her gently and check BMP in the morning, and replace the potassium as well.  3.  Hypertension. Continue HCTZ 25 mg p.o. daily.  4.  Depression. Continue Celexa 20 mg p.o. daily.  5.  Tobacco abuse.  Counseled the again of smoking for about 5 minutes. She says she is in the process of quitting.   TIME SPENT: About 55 minutes.    ____________________________ Katha HammingSnehalatha Jernard Reiber, MD sk:dm D: 06/12/2012 21:12:00 ET T: 06/12/2012 21:39:08 ET JOB#: [id number]  cc: Katha HammingSnehalatha Kreg Earhart, MD, <Dictator> Katha HammingSNEHALATHA Jese Comella MD ELECTRONICALLY SIGNED 07/05/2012 14:49

## 2014-08-24 NOTE — H&P (Signed)
PATIENT NAME:  Diane [name], Diane [name] MR#:  [id number] DATE OF BIRTH:  Jun 28, 1955  DATE OF ADMISSION:  06/12/2013  PRIMARY CARE PHYSICIAN: Dr. Duncan Dulleresa [name].   REFERRING PHYSICIAN: Dr. Doug SouSam Jacubowitz.   CHIEF COMPLAINT: Shortness of breath.   HISTORY OF PRESENT ILLNESS: Diane [name] is [name] 59 year old female with history of COPD,  hypertension, continued tobacco use, presented to the Emergency Department with complaints of shortness of breath, cough with productive sputum. Has been experiencing subjective fevers. Concerning this, came to the Emergency Department. Work-up in the Emergency Department, the patient was diffusely wheezing, received multiple breathing treatments. Chest x-ray does not show any obvious infiltrates,  showed severe emphysema.   PAST MEDICAL HISTORY: 1.  COPD.  2.  Hypertension.  3.  Continued tobacco use.  4.  Tubal ligation.   ALLERGIES:  1.  LEVAQUIN. 2.  PENICILLIN. 3.  ASPIRIN.  Marland Kitchen.  HOME MEDICATIONS: 1.  Zofran 4 mg 3 times [name] day.  2.  Spiriva 18 mcg once [name] day.  3.  Protonix 40 mg once [name] day.  4.  Lunesta 3 mg 1 tablet once [name] day.  5.  Lisinopril 10 mg once [name] day.  6.  Dulera 2 puffs 2 times [name] day.  7.  Celexa 20 mg once [name] day.  8.  Albuterol 90 mcg 1 puff 4 times [name] day.   SOCIAL HISTORY:  Continues to smoke five cigarettes [name] day. Denies drinking alcohol or using illicit drugs. Married, lives with her husband.   FAMILY HISTORY: Heart problems.   REVIEW OF SYSTEMS: CONSTITUTIONAL: Generalized weakness.  EYES: No change in vision.  ENT: No change in hearing.  RESPIRATORY:  Has cough, shortness of breath.  CARDIOVASCULAR: No chest pain, palpitations.  GASTROINTESTINAL: No nausea, vomiting, abdominal pain.  GENITOURINARY: No dysuria or hematuria. HEMATOLOGIC: No easy bruising or bleeding.  ENDOCRINE: No polyuria or polydipsia.  SKIN: No rash or lesions.  MUSCULOSKELETAL: No joint pains and aches.  NEUROLOGIC: No weakness or numbness in any part of the body.    PHYSICAL EXAMINATION: GENERAL: This is [name] well-built, well-nourished age-appropriate female lying down in the bed, not in distress.  VITAL SIGNS: Temperature 98.2, pulse 88, blood pressure 134/78, respiratory rate of 16, oxygen saturation is 95% on room air.  HEENT: Head normocephalic, atraumatic. There is no scleral icterus. Conjunctivae normal. Pupils equal and react to light. Extraocular movements are intact. Mucous membranes moist. No pharyngeal erythema.  NECK: Supple. No lymphadenopathy. No JVD. No carotid bruit.  CHEST: No focal tenderness. Bilateral diffuse wheezing.  HEART: S1, S2, regular. No murmurs are heard.  ABDOMEN: Bowel sounds present. Soft, nontender, nondistended. No hepatosplenomegaly.  EXTREMITIES: No pedal edema. Pulses 2+.  NEUROLOGIC: The patient is alert, oriented to place, person and time. Cranial nerves II through XII intact. Motor 5/5 in upper and lower extremities.   LABORATORY DATA: CBC is completely within normal limits.  BMP is completely within normal limits.   Chest x-ray shows severe emphysema, no acute abnormalities.   ASSESSMENT AND PLAN: Diane [name] is [name] 59 year old female who comes to the Emergency Department with shortness of breath.  1.  Chronic obstructive pulmonary disease exacerbation: Continue with the breathing treatments, Rocephin and Zithromax, Solu-Medrol.  2.  Continued tobacco use. Counseled with the patient.  3.  Hypertension, well controlled.  4.  Keep the patient on deep vein thrombosis prophylaxis with Lovenox.   TIME SPENT: 45 minutes.   ____________________________ Susa GriffinsPadmaja Manuelito Poage, MD pv:NTS D: 06/13/2013 00:22:56 ET T: 06/13/2013  01:13:34 ET JOB#: [id number]  cc: Susa Griffins, MD, <Dictator> Diane Dull, MD Susa Griffins MD ELECTRONICALLY SIGNED 07/05/2013 1:21

## 2014-08-24 NOTE — Discharge Summary (Signed)
PATIENT NAME:  Diane [name], Diane [name] MR#:  [id number] DATE OF BIRTH:  1955-12-20  DATE OF ADMISSION:  06/12/2013 DATE OF DISCHARGE:  06/14/2013  DISCHARGE DIAGNOSES:  1. Acute respiratory failure, likely due to chronic obstructive pulmonary disease exacerbation, now back to baseline. 2. Chronic obstructive pulmonary disease exacerbation, now resolved.  3. Anxiety started on Klonopin will require outpatient psychiatry follow-up.   SECONDARY DIAGNOSES: 1. Chronic obstructive pulmonary disease.  2. Hypertension.  3. Ongoing tobacco abuse.   CONSULTATIONS: None.   PROCEDURES AND RADIOLOGY: Chest x-ray on 10th of February showed severe emphysema without any acute disease.   HISTORY AND SHORT HOSPITAL COURSE: The patient is [name] 59 year old female with the above-mentioned medical problems admitted for acute respiratory failure thought to be secondary to COPD exacerbation. She was started on IV steroid and antibiotics and Spiriva and was improving. She was found to be significantly anxious ` she was started on benzodiazepine which was helping her, and she was feeling much better. Within 48 hours she was close to her baseline on 12th of February and is being discharged home in stable condition. She has been at room air and maintaining oxygen saturations without any difficulty.   PHYSICAL EXAMINATION: VITAL SIGNS: On the date of discharge, her vital signs are as follows: Temperature 98.2, heart rate 91 per minute, respirations 20 per minute, blood pressure 112/72, mm hg  and she was saturating 91% on room air.  CARDIOVASCULAR: S1, S2 normal. No murmurs, rubs or gallop.  LUNGS: Clear to auscultation bilaterally. No wheezing, rales, rhonchi, or crepitation.  ABDOMEN: Soft, benign.  NEUROLOGIC: Nonfocal examination.   All other physical examination remained at baseline.   DISCHARGE MEDICATIONS: 1. Spiriva once daily.  2. Lisinopril 10 mg p.o. daily.  3. Lunesta 3 mg p.o. at bedtime.  4. Albuterol 1 puff  inhaled 4 times [name] day as needed.  5. Protonix 40 mg p.o. daily.  6. Zofran 4 mg p.o. 3 times [name] day as needed.  7. Dulera 2 puffs inhaled twice [name] day. 8. Celexa 20 mg p.o. daily.  9. Clonazepam 0.5 mg p.o. at bedtime as needed.  10. Prednisone 60 mg p.o. daily, taper 10 mg daily until finished.  11. Augmentin 875/125 and 1 tablet p.o. b.i.d. for five days.   DISCHARGE DIET: Low-sodium.   DISCHARGE ACTIVITY: As tolerated.   DISCHARGE INSTRUCTIONS AND FOLLOW-UP: The patient was instructed to follow up with her primary care physician, Dr. Duncan Dulleresa Tullo in 4 to 6 weeks. She will need follow-up [facility] in 1 to 2 weeks. She will need follow-up with pulmonary, Dr. Saintclair Halstedobert McQuaid in 2 to 4 weeks. She was also instructed to follow with pulmonary [facility] in one week.   TOTAL TIME DISCHARGING THIS PATIENT: 55 minutes. ____________________________ Ellamae SiaVipul S. Sherryll BurgerShah, MD vss:sg D: 06/14/2013 16:26:45 ET T: 06/14/2013 16:38:13 ET JOB#: [id number]  cc: Diane [name] S. Sherryll BurgerShah, MD, <Dictator> Duncan Dulleresa Tullo, MD Lupita Leashouglas B. McQuaid, MD [facility]  Ellamae SiaVIPUL S [facility] MD ELECTRONICALLY SIGNED 06/16/2013 12:23

## 2014-08-24 NOTE — Discharge Summary (Signed)
PATIENT NAME:  Diane [name], Diane [name] MR#:  [id number] DATE OF BIRTH:  Sep 11, 1955  DATE OF ADMISSION:  12/16/2013 DATE OF DISCHARGE:  12/25/2013  ADMISSION DIAGNOSES:  1.  Acute exacerbation of chronic obstructive pulmonary disease. 2.  Acute respiratory failure.   DISCHARGE DIAGNOSES:  1.  Acute respiratory failure secondary to acute exacerbation of chronic obstructive pulmonary disease with Pseudomonas pneumonia.  2.  Acute exacerbation of chronic obstructive pulmonary disease with ongoing tobacco abuse and Pseudomonas pneumonia.  3.  Pseudomonas pneumonia.  4.  Tobacco dependence.  5.  Hypertension.   CONSULTATIONS: Dory LarsenKurian D. Kasa, MD from pulmonary.   LABORATORIES AT DISCHARGE: [name] 2D echocardiogram showing normal ejection fraction with any major valvular abnormalities.   Sodium 139, potassium 4.0, chloride 101, bicarbonate 27, BUN 13, creatinine 0.96, glucose is 121.   HOSPITAL COURSE: [name] 59 year old female who presented with shortness of breath, found to have acute exacerbation for chronic obstructive pulmonary disease. For further details, please refer to the H and P.   1.  Acute respiratory failure secondary to COPD acute exacerbation due to tobacco use and Pseudomonas pneumonia. The patient was treated for COPD and Pseudomonas as outlined below.  2.  Pseudomonas pneumonia/pneumonitis. The patient was initially placed on Levaquin and then changed to meropenem. She actually did quite well. Her sputum culture was for pseudomonas, although her chest x-ray did not show evidence of [name] pneumonia. This was in [name] chest x-ray that was taken on admission. I suspect that she had [name] pneumonia as her sputum culture was positive for Pseudomonas. She was treated and will need [name] few more days of antibiotics.  3.  Acute exacerbation of COPD. The patient is on IV steroids, transitioned to p.o. steroids, continued on inhalers and nebulizers. She will need oxygen at discharge. We appreciate Dr. Clovis FredricksonKasa's pulmonary  consult.  4.  Tobacco abuse. The patient was counseled to quit smoking during this admission and she is very motivated to quit.  5.  Anxiety. The patient was on Xanax p.r.n. in the hospital. Her anxiety seems to be controlled.  6.  Hypertension. Initially, her blood pressure was low, but her blood pressure is now better and she may resume her outpatient medications.   DISCHARGE MEDICATIONS:  1.  Ventolin HFA 1 to 2 puffs 4 times [name] day p.r.n.  2.  Citalopram 40 mg daily.  3.  Lisinopril 10 mg daily.  4.  Spiriva 2 puffs daily. 5.  Dulera 2 puffs b.i.d.  6.  Pantoprazole 40 mg daily.  7.  Prednisone taper starting at 50 mg, taper by 10 mg every 2 days.  8.  Levaquin 750 mg daily for 5 days.   DISCHARGE HOME HEALTH: With physical therapy and nurse for assessment in gait.   DISCHARGE OXYGEN: Two liters nasal cannula.   DISCHARGE DIET: Regular.   DISCHARGE SUPPLEMENT: Ensure daily.  DISCHARGE FOLLOWUP: The patient will follow up with Dr. Darrick Huntsmanullo in 1 week.   TIME SPENT: Thirty-five minutes.  DISCHARGE CONDITION: The patient is stable for discharge.     ____________________________ Dionel Archey P. Juliene PinaMody, MD spm:TT D: 12/25/2013 14:19:32 ET T: 12/25/2013 22:17:49 ET JOB#: [id number]  cc: Beckham Buxbaum P. Juliene PinaMody, MD, <Dictator> Duncan Dulleresa Tullo, MD Janyth ContesSITAL P Malachi Suderman MD ELECTRONICALLY SIGNED 12/26/2013 10:29

## 2014-08-24 NOTE — H&P (Signed)
PATIENT NAME:  Diane [name], Diane [name] MR#:  [id number] DATE OF BIRTH:  06-Feb-1956  PRIMARY CARE PROVIDER: Duncan Dull, MD  EMERGENCY DEPARTMENT REFERRING PHYSICIAN: Daryel November, MD  CHIEF COMPLAINT: Shortness of breath, cough.   HISTORY OF PRESENT ILLNESS: The patient is a 59 year old white female with history of COPD, not O2 dependent, who was last hospitalized here in February, presents with shortness of breath since yesterday. She reports that she is having shortness of breath that started all of a sudden yesterday, and has had a productive cough of whitish sputum. The patient has not had any fevers or chills. She came to the ED tachycardic and short of breath. The patient received multiple breathing treatments without any improvement. Therefore, we are asked to admit the patient. She otherwise denies any chest pains, palpitations. No fevers, no chills. Complains of some nausea, but no vomiting. No diarrhea. Has history of constipation, but none now. Denies any urinary symptoms.   PAST MEDICAL HISTORY: Significant for:  1.  COPD. 2.  History of hypertension.  3.  GERD.  4.  History of anxiety and depression.  5.  Continued tobacco use.  6.  History of tubal ligation.   ALLERGIES: LEVAQUIN, PENICILLIN AND ASPIRIN.   MEDICATIONS: Zofran 4 mg 3 times a day as needed, Spiriva 18 mcg q. daily, Protonix 40 q. daily, Lunesta 3 mg 1 tab p.o. q. daily, lisinopril 10 q. daily, Dulera 2 puffs 2 times a day, Celexa 40 q. daily, albuterol, ipratropium nebulizers q. 6 p.r.n., albuterol 1 puff 4 times a day as needed. She is also on clonazepam 0.5 p.o. at bedtime as needed.   SOCIAL HISTORY: Continues to smoke a few cigarettes a day. No alcohol or drug use. Lives with her husband.   FAMILY HISTORY: Positive for heart problems. Unable to state which problem is that.  REVIEW OF SYSTEMS:  CONSTITUTIONAL: Complains of no fevers. Complains of fatigue, weakness. No pain. No weight loss, no weight gain.   EYES: No blurred or double vision. No redness. No inflammation. No glaucoma. No cataracts.  ENT: No tinnitus. No ear pain. No hearing loss. No seasonal or year-round allergies. No difficulty swallowing.  RESPIRATORY: Complains of cough, wheezing, history of COPD, no hemoptysis. Has dyspnea.  GASTROINTESTINAL: No nausea, vomiting, diarrhea. No abdominal pain. No hematemesis. No melena. History of GERD.  ENDOCRINE:  Denies any polyuria, nocturia, or thyroid problems.  HEMATOLOGIC AND LYMPHATIC:  Denies anemia, easy bruisability, or bleeding.  SKIN: No acne. No rash. No changes in mole, hair, or skin. MUSCULOSKELETAL: Denies any pain in neck, back, or shoulder.  NEUROLOGIC: No numbness, CVA, TIA, or seizures.  PSYCHIATRIC: Has anxiety, depression.  PHYSICAL EXAMINATION: VITAL SIGNS: Temperature 97.9. Pulse was 118, respirations 22, blood pressure 154/122, O2 of 99%.  GENERAL: The patient is a thin female in mild respiratory distress with accessory muscle usage.  HEENT: Head atraumatic, normocephalic. Pupils equally round, react to light and accommodation. There is no conjunctival pallor. No scleral icterus. Nasal exam shows no drainage or ulceration.  Oropharynx is clear without any exudate. External ear exam shows no drainage or erythema.  NECK: Supple without any JVD. No thyromegaly.  CARDIOVASCULAR: Tachycardic, no murmurs, rubs, clicks, or gallops. LUNGS: Has mild accessory muscle usage with diminished breath sounds in bilateral lungs. There is no auditory wheezing. No crackles or rhonchi.  ABDOMEN: Soft, nontender, nondistended. Positive bowel sounds x 4.  EXTREMITIES: No clubbing, cyanosis, or edema.  SKIN: No rash.  LYMPHATICS: No lymph nodes palpable.  VASCULAR: Good DP, PT pulses.   PSYCHIATRIC: Not anxious or depressed.  NEUROLOGICAL: Awake, alert, oriented x 3. No focal deficits.   LABORATORY DATA: Glucose 100, BUN 6, creatinine 0.80, sodium 142, potassium 4.1, chloride 105, CO2  is 28, calcium 9.3. LFTs are normal. Troponin less than 0.02. WBC 6.0, hemoglobin 14.4, platelet count 329,000. Chest x-ray shows hyperinflation without acute infiltrates.   ASSESSMENT AND PLAN: The patient is a 59 year old white female with history of chronic obstructive pulmonary disease, presents with shortness of breath, cough, received muscle multiple breathing treatments in the Emergency Department, still short of breath.  1.  Acute on chronic obstructive pulmonary disease exacerbation.  At this time, will treat her with intravenous Solu-Medrol and nebulizers and intravenous antibiotics with azithromycin. The patient reports that she does not want to take cephalosporins due to allergy to penicillin.   She has been told by her primary care provider not to take those.   2.  Hypertension.  Will continue lisinopril as taking at home.  3.  Depression and anxiety, continue Celexa. 4.  Gastroesophageal reflux disease. Will continue Protonix.  5.  Nicotine addiction. Smoking cessation done, 4 minutes spent. The patient recommended to stop smoking. The patient does not want a patch. She used a nicotine gum in the past. Will use nicotine lozenges while in the hospital as needed.   TIME SPENT:  Sixty minutes.   ____________________________ Lacie ScottsShreyang H. Allena KatzPatel, MD shp:LT D: 12/16/2013 17:59:09 ET T: 12/16/2013 18:41:32 ET JOB#: [id number]  cc: Evander Macaraeg H. Allena KatzPatel, MD, <Dictator> Charise CarwinSHREYANG H Ketzaly Cardella MD ELECTRONICALLY SIGNED 12/18/2013 14:34

## 2014-08-26 LAB — EXPECTORATED SPUTUM ASSESSMENT W GRAM STAIN, RFLX TO RESP C

## 2014-08-26 NOTE — Telephone Encounter (Signed)
Needs to be seen either location asap to sort out

## 2014-08-27 ENCOUNTER — Telehealth: Payer: Self-pay

## 2014-08-27 LAB — PLATELET COUNT: PLATELETS: 330 10*3/uL (ref 150–440)

## 2014-08-27 NOTE — Telephone Encounter (Signed)
Pt moved to Monday afternoon

## 2014-08-27 NOTE — Telephone Encounter (Signed)
The patient called and [facility] ([facility]26)

## 2014-08-27 NOTE — Telephone Encounter (Signed)
Can she be added Monday afternoon

## 2014-08-27 NOTE — Telephone Encounter (Signed)
Discharge summary requested.

## 2014-08-27 NOTE — Telephone Encounter (Signed)
yes

## 2014-08-28 NOTE — Addendum Note (Signed)
Addended by: Chandra BatchNIXON, Gillian Kluever E on: 08/28/2014 01:30 PM   Modules accepted: Orders, Medications

## 2014-08-28 NOTE — Telephone Encounter (Signed)
TCM call completed. Discharge summary given to [employer].

## 2014-08-28 NOTE — Telephone Encounter (Addendum)
Discharge Date: 08/27/14  Transition Care Management Follow-up Telephone Call  How have you been since you were released from the hospital? Pt feeling weak. Still having some shortness of breath, but is on O2 by [place] 3L   Do you understand why you were in the hospital? YES, COPD exacerbation, acute bronchitis   Do you understand the discharge instructions? YES, using O2 @3L , discharged home with prednisone taper  Items Reviewed:  Medications reviewed: YES, pt started on prednisone taper, will be complete by appointment with Dr. Darrick Huntsmanullo. No other changes to meds.   Allergies reviewed: YES, no new drug allerfies  Dietary changes reviewed: YES, low sodium  Referrals reviewed: HH with PT and nurse- pt declined, has help at home. Needs home O2 3L by [place]-currently using as directed   Functional Questionnaire:  Activities of Daily Living (ADLs):  She states they are independent in the following: all ADLs, but using assistance getting around her house States they require assistance with the following:None  Any transportation issues/concerns?: NO, drives self if feeling well. Will have someone drive her if not feeling well by appt.    Any patient concerns? None at this time. Advised to call back if she does have any.    Confirmed importance and date/time of follow-up visits scheduled: YES. Appt with Dr. Darrick Huntsmanullo 09/02/14 @ 3:30, pt verbalized understanding. Follow up with Dr. Kendrick FriesMcQuaid 09/12/14   Confirmed with patient if condition begins to worsen call PCP or go to the ER. Patient was given the Call-a-Nurse line [phone]: YES

## 2014-09-01 NOTE — Discharge Summary (Signed)
PATIENT NAME:  Diane [name], Diane [name] MR#:  [id number] DATE OF BIRTH:  1955/06/23  DATE OF ADMISSION:  08/22/2014 DATE OF DISCHARGE:  08/27/2014  PRIMARY CARE PHYSICIAN: Diane Dulleresa Tullo, MD  DISCHARGE DIAGNOSES:  1.  Chronic obstructive pulmonary disease exacerbation. 2.  Acute bronchitis. 3.  Acute on chronic respiratory failure. 4.  Hypertension. 5.  Anxiety.   CONDITION: Stable.   CODE STATUS: FULL.  HOME MEDICATIONS: Please refer to the medication reconciliation list.   DISCHARGE DIET: Low-sodium.  DISCHARGE ACTIVITY: As tolerated.   FOLLOW-UP CARE: Follow up with PCP within 1 to 2 weeks. The patient also needs to follow up with her pulmonary physician within 1 to 2 weeks. The patient needs home health with physical therapy and nurse and needs home oxygen 3 liters by nasal cannula.   REASON FOR ADMISSION: Shortness of breath.   HOSPITAL COURSE:  1.  The patient is [name] 59 year old Caucasian female with [name] history of COPD and chronic respiratory failure, on home oxygen, came to the ED due to worsening shortness of breath. For detailed history and physical examination, please refer to the admission note dictated by Dr. Winona LegatoVaickute. The patient was admitted for COPD exacerbation with acute on chronic respiratory failure. After admission the patient has been treated with IV Solu-Medrol, nebulizer treatment, Spiriva and Dulera. The patient continues to have shortness of breath, cough, sputum, and wheezing. The patient is still on 3 liters oxygen by nasal cannula. Dr. Welton FlakesKhan evaluated the patient and suggests continue current treatment. The patient's symptoms have much improved today. The patient only has tiny wheezing, but no rhonchi or crackles, but the patient is still on 3 liters oxygen.  2.  Tobacco abuse. The patient was counseled for smoking cessation.   The patient is clinically stable and will be discharged to home with home health and PT today. The patient also needs home oxygen, 3 liters by nasal  cannula. I discussed the patient's discharge plan with the patient, nurse, and the case manager.   TIME SPENT: About 39 minutes.  ____________________________ Shaune PollackQing Davione Lenker, MD qc:sb D: 08/27/2014 14:37:57 ET T: 08/27/2014 16:21:02 ET JOB#: [id number]  cc: Shaune PollackQing Akiya Morr, MD, <Dictator> Shaune PollackQING Gelena Klosinski MD ELECTRONICALLY SIGNED 08/27/2014 18:07

## 2014-09-01 NOTE — H&P (Addendum)
PATIENT NAME:  Diane [name], Diane [name] MR#:  [id number] DATE OF BIRTH:  1955-10-27  DATE OF ADMISSION:  08/22/2014  PRIMARY CARE PHYSICIAN:  Duncan Dull, MD.    HISTORY OF PRESENT ILLNESS:  The patient is a 59 year old Caucasian female with past medical history significant for history of COPD, chronic respiratory failure on oxygen therapy at home at nighttime only, who presented to the hospital with complaints of worsening shortness of breath. Apparently the patient was doing well up until approximately a week ago when she started having increasing cough as well as shortness of breath. She has been producing some whitish-looking phlegm as well as wheezing. She denies any high fevers. Admits of feeling somewhat lightheaded with cough and not able even to hold her urine. She presented to the Emergency Room for further evaluation, where she was noted to be tachycardiac and hospital services were contacted for admission after failed therapy.   PAST MEDICAL HISTORY: Significant for history of COPD, chronic respiratory failure, oxygen dependent, oxygen at 2 liters of oxygen nasal cannula at nighttime with CPAP, history of acute respiratory failure admission in August of 2015, at that time she also had Pseudomonas pneumonia, tobacco dependence as well as history of hypertension. History is also significant for gastroesophageal reflux disease, anxiety, depression, as well as history of tubal ligation.   ALLERGIES: LEVAQUIN, PENICILLIN, AS WELL AS ASPIRIN, ALLERGY TO LEVAQUIN WAS GI DISTRESS.   MEDICATION LIST:  Albuterol nebulizers every 4-6 hours as needed, Tussionex 5 mL twice daily as needed, citalopram 40 mg p.o. daily, Dulera 5/200 two puffs twice daily, lisinopril 10 mg p.o. daily, prednisone taper, probiotic formula once daily, Spiriva once daily, Ventolin 1-2 puffs 4 times daily as needed   SOCIAL HISTORY: The patient continues to smoke a few cigarettes there and there.  No history of alcohol or drug abuse.  Lives with her husband as well as a dog.   FAMILY HISTORY: Positive for heart problems.   REVIEW OF SYSTEMS: Positive for cough, phlegm production, some nausea. A few weeks ago she was diagnosed with urinary tract infection for which she was prescribed ciprofloxacin which gave her even worse nausea. She took Bactrim with no significant abnormalities and she seems to be improving now. Denies any high fevers or chills, weight loss or gain.  No pain.  EYES: Denies any blurry vision, double vision, glaucoma, or cataracts.  EARS, NOSE, AND THROAT: Denies any tinnitus, allergies, epistaxis, sinus pain, dentures, difficulty swallowing.  RESPIRATORY:  Admits to cough, admits to wheezes. Denies any hemoptysis. Admits to significant shortness of breath worsening over a period of time. Denies any chest pains, orthopnea, edema, arrhythmias, palpitations, or syncope.   GASTROINTESTINAL:  Denies any nausea, vomiting, diarrhea, constipation recently.  GENITOURINARY: Denies dysuria, hematuria, frequency, incontinence.   ENDOCRINOLOGY: Denies any polydipsia, nocturia, thyroid problems, heat or cold intolerance, or thirst.  HEMATOLOGIC: Denies anemia, easy bruising, bleeding, or swollen glands.  SKIN: Denies acne, rashes, lesions, change in moles.  MUSCULOSKELETAL: Denies arthritis, cramps.   NEUROLOGIC: Denies numbness, epilepsy, or tremor.  PSYCHIATRIC: Denies anxiety, insomnia, or depression.   PHYSICAL EXAMINATION:  VITAL SIGNS: On arrival to the hospital, the patient's vital signs, temperature was 97.9, pulse was 109, respirations were 20, blood pressure 129/110, O2 saturations were 95% on room air, 97% on oxygen therapy.  GENERAL: This is a well-nourished, thin Caucasian female in moderate distress, uncomfortable on the stretcher. She is tachycardic as well as short of breath whenever she moves around or whenever she talks  to me.  HEENT: Her pupils are equal, reactive to light. Extraocular movements intact.   No icterus or conjunctivitis. Has normal hearing. No pharyngeal erythema. Mucosa is moist.  NECK: No masses, supple, nontender. Thyroid is not enlarged. No adenopathy. No JVD or carotid bruits bilaterally. Full range of motion.  LUNGS: Rales as well as rhonchi were noted anteriorly, as well as diminished breath sounds and wheezing. The patient does have labored inspirations as well as increased effort to breathe. She has no dullness to percussion. She is in mild respiratory distress which increases in intensity whenever she moves around even in the chair or talks.  CARDIOVASCULAR: S1, S2 present.  Rhythm was regular.  PMI not lateralized.  Was tachycardic.  CHEST: Nontender to palpation.   EXTREMITIES: 1 + pedal pulses. No lower extremity edema, calf tenderness, or cyanosis was noted.  ABDOMEN: Soft, nontender. Bowel sounds are present. No hepatosplenomegaly or masses were noted. RECTAL: Deferred.  MUSCLE STRENGTH: Able to move all extremities. No cyanosis, degenerative joint disease, or kyphosis. Gait is not tested.  SKIN: Did not reveal any rashes, lesions, erythema, nodularity, or induration. It was warm and dry to palpation.  LYMPHATIC: No adenopathy in the cervical region.  NEUROLOGIC: Cranial nerves grossly intact. Sensory is intact. No dysarthria or aphasia. The patient is alert, oriented to time, person, place, cooperative. Memory is good.  PSYCHIATRIC: No significant confusion, agitation, or depression noted.  LABORATORY DATA:  Glucose 106, otherwise unremarkable comprehensive metabolic panel. Troponin was less than 0.03. CBC, white blood cell count is normal at 7.5, hemoglobin 14.4, platelet count 373,000. Urinalysis was unremarkable. The patient's ABGs were done on 28% FiO2, showed pH of 7.39, pCO2 of 37, pO2 of 64, saturation was 92.0%.   RADIOLOGIC STUDIES: Chest x-ray portable single view did not show any acute cardiopulmonary abnormality. EKG revealed sinus tachycardia at 108 beats per  minutes, normal axis, right atrial enlargement according to EKG criteria, no acute ST-T changes were noted.   ASSESSMENT AND PLAN:  1.  Acute respiratory distress due to chronic obstructive pulmonary disease exacerbation. Admit the patient to the medical floor, start her on steroids IV as well as nebulizers, inhalers, and antibiotic therapy with levofloxacin IV, getting her sputum cultures and adjusting the antibiotics depending on culture results.  2.  Acute bronchitis. We will initiate the patient on levofloxacin IV since she is not able to tolerate it orally. We will get sputum cultures and make decisions about antibiotic depending on culture results.  3.  Hyperglycemia. Check hemoglobin A1c.  4.  Sinus tachycardia due to hypoxia, seems to be improving with oxygen application. 5.  Tobacco abuse. Discussed cessation for approximately 3 minutes. Nicotine replacement therapy will be initiated.   TIME SPENT: 45 minutes.     ____________________________ Katharina Caperima Susumu Hackler, MD rv:bu D: 08/22/2014 17:22:32 ET T: 08/22/2014 18:28:56 ET JOB#: [id number]  cc: Katharina Caperima Siona Coulston, MD, <Dictator> Duncan Dulleresa Tullo, MD Malala Trenkamp MD ELECTRONICALLY SIGNED 08/30/2014 19:25

## 2014-09-02 ENCOUNTER — Ambulatory Visit (INDEPENDENT_AMBULATORY_CARE_PROVIDER_SITE_OTHER): Payer: Managed Care, Other (non HMO) | Admitting: Internal Medicine

## 2014-09-02 ENCOUNTER — Encounter: Payer: Self-pay | Admitting: Internal Medicine

## 2014-09-02 VITALS — BP 108/70 | HR 100 | Temp 98.4°F | Resp 16 | Ht 66.0 in | Wt 137.8 lb

## 2014-09-02 DIAGNOSIS — J9611 Chronic respiratory failure with hypoxia: Secondary | ICD-10-CM

## 2014-09-02 MED ORDER — LACTULOSE 20 GM/30ML PO SOLN: ORAL | Status: DC

## 2014-09-02 NOTE — Patient Instructions (Signed)
Continue wearing 3 L Oxygen until you see   Dr Kendrick FriesMcquaid  Practice your pursed lip breathing exercises whenever you think abut it.   The lactulose is for severe constipation and should be used sparingly.
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
## 2014-09-02 NOTE — Progress Notes (Signed)
Pre-visit discussion using our clinic review tool. No additional management support is needed unless otherwise documented below in the visit note.  

## 2014-09-03 ENCOUNTER — Ambulatory Visit: Payer: Managed Care, Other (non HMO) | Admitting: Nurse Practitioner

## 2014-09-04 ENCOUNTER — Encounter: Payer: Self-pay | Admitting: Internal Medicine

## 2014-09-04 NOTE — Assessment & Plan Note (Signed)
With recent admission to [facility] for acute on chronic failure secondary to COPD exacerbation. Patient's lung exam is clear today,  Continue 3 L 02 until seen by Pulmonologist Dr Kendrick FriesMcQuaid.  Letter written to employer for work absence and expressing my concern that patient's frequent exacerbations may be due to environmental exposures to irritants at the workplace.

## 2014-09-05 ENCOUNTER — Telehealth: Payer: Self-pay | Admitting: Internal Medicine

## 2014-09-05 NOTE — Telephone Encounter (Signed)
Labs not abstracted due to labs are in chart from Hospital encounter.

## 2014-09-11 ENCOUNTER — Encounter: Payer: Self-pay | Admitting: Internal Medicine

## 2014-09-11 ENCOUNTER — Other Ambulatory Visit: Payer: Self-pay | Admitting: Internal Medicine

## 2014-09-11 MED ORDER — MONTELUKAST SODIUM 10 MG PO TABS: 10.0000 mg | ORAL_TABLET | Freq: Every day | ORAL | Status: DC

## 2014-09-12 ENCOUNTER — Encounter: Payer: Self-pay | Admitting: Pulmonary Disease

## 2014-09-12 ENCOUNTER — Ambulatory Visit (INDEPENDENT_AMBULATORY_CARE_PROVIDER_SITE_OTHER): Payer: Managed Care, Other (non HMO) | Admitting: Pulmonary Disease

## 2014-09-12 ENCOUNTER — Other Ambulatory Visit
Admission: RE | Admit: 2014-09-12 | Discharge: 2014-09-12 | Disposition: A | Discharge status: Home / Self Care | Payer: Managed Care, Other (non HMO) | Source: Ambulatory Visit | Attending: Pulmonary Disease | Admitting: Pulmonary Disease

## 2014-09-12 VITALS — BP 128/76 | HR 102 | Temp 97.5°F | Ht 66.0 in | Wt 136.0 lb

## 2014-09-12 DIAGNOSIS — J441 Chronic obstructive pulmonary disease with (acute) exacerbation: Secondary | ICD-10-CM | POA: Diagnosis not present

## 2014-09-12 DIAGNOSIS — H539 Unspecified visual disturbance: Secondary | ICD-10-CM | POA: Insufficient documentation

## 2014-09-12 MED ORDER — AZITHROMYCIN 250 MG PO TABS: ORAL_TABLET | ORAL | Status: AC

## 2014-09-12 MED ORDER — LOSARTAN POTASSIUM 50 MG PO TABS: 50.0000 mg | ORAL_TABLET | Freq: Two times a day (BID) | ORAL | Status: DC

## 2014-09-12 NOTE — Patient Instructions (Signed)
We will arrange a CT scan of your chest and call you with the results We will obtain blood work today and call you with the results Let us know after you've seen the eye doctor and we will prescribe more prednisone We will see you back in 6 weeks or sooner if needed

## 2014-09-12 NOTE — Telephone Encounter (Signed)
Ok refill? 

## 2014-09-12 NOTE — Progress Notes (Signed)
Subjective:    Patient ID: Diane [name], female    DOB: 29-Sep-1955, 59 y.o.   MRN: [id number]   Synopsis: [name] first saw the [facility] in July 2014 for COPD. She has had multiple hospitalizations and doctor visits for exacerbations of COPD. As of October of 2014 she continues to smoke cigarettes. A 2014 FEV1 was 55% predicted. In 2015 she continued to have multiple exacerbations and she was hospitalized in September. She was hospitalized for 9 days for a COPD exacerbation, during that time she was found to have sputum growing Pseudomonas. After that hospitalization she quit smoking and she started using nocturnal noninvasive mechanical ventilation with oxygen at home. She also uses oxygen on exertion.   HPI  Chief Complaint  Patient presents with  . Hospitalization Follow-up    Pt went to [facility] for bronchitis, sob approx 2 weeks ago.  Pt c/o sob with exertion, prod cough with white mucus.     Diane [name] was hospitalized for shortness of breath, bronchitis, cough, wheezing.  She was told she had bronchitisi. She was admitted for 5 days, treated with an antibiotic and steroids.  She did not take the Daliresp one ago because she was sick with "other stuff".  She was treated for a UTI which made her nauseated so she stopped the Daliresp.  Since leaving the hospital she remains weak, still more short of breath.  She is coughing up white sputum.   Past Medical History  Diagnosis Date  . COPD (chronic obstructive pulmonary disease)   . Hyperlipidemia   . Tobacco abuse   . Depression   . Anxiety   . Hypertension      Review of Systems  Constitutional: Negative for fever, activity change and fatigue.  HENT: Negative for congestion, postnasal drip, rhinorrhea and sinus pressure.   Respiratory: Positive for cough and shortness of breath. Negative for wheezing.   Cardiovascular: Negative for chest pain, palpitations and leg swelling.       Objective:   Physical  Exam  Filed Vitals:   09/12/14 1419  BP: 128/76  Pulse: 102  Temp: 97.5 F (36.4 C)  TempSrc: Oral  Height: 5\' 6"  (1.676 m)  Weight: 136 lb (61.689 kg)  SpO2: 98%  RA  Gen: no acute distress HEENT: NCAT, EOMi PULM: Slight wheezing bilaterally, good air movement CV: RRR, no mgr AB: BS+, soft, nontender Ext: warm, no edema  February 2014 [facility] no pulmonary embolism, there is moderate to severe centrilobular emphysema in the upper lobes right greater than left bilaterally, no other market pulmonary abnormality March 2014 full pulmonary function testing at [facility]>> ratio 43%, FEV1 1.42 L (55% predicted), TLC 5.74 L (108% predicted), residual volume 2.46 L (125% predicted), DLCO 52% predicted, FEV1 improved more than 12% with a bronchodilator September 2015 chest x-ray images personally reviewed> no evidence of pulmonary parenchymal disease with the exception of emphysema, normal heart contour  April 2016 hospital discharge summary and lab work reviewed, no eosinophilia during that time Chest x-rays from April 2016 reviewed, emphysema no infiltrate noted     Assessment & Plan:   COPD exacerbation Diane [name] is having yet another exacerbation of COPD but this was mild. She should be able to manage it with bronchodilators at home. I would like to treat her with prednisone but she has developed visual changes in the right eye recently which I worry may be related to all the prednisone she's been  treated with recently (block, versus cataract).  She has very severe COPD and her recurrent exacerbations phenotype is very worrisome. I'm a bit frustrated that she stop taking the Roflumilast last month as that is intended to prevent exacerbations.  Plan: Resume roflumilast Continue bronchodilators as scheduled Use albuterol twice a day minimum Go see an eye doctor, call me with the results If the eye doctor does not find anything  which is steroid related then I plan to treat her with prednisone for 6 weeks' time, 20 mg for 2 weeks, 10 mg for 2 weeks, 5 mg or 2 weeks We need to try to figure out why she keeps having recurrent exacerbations of COPD: To that and I would like to get a CBC with differential, serum IgE, and a CT scan to look for evidence of concomitant interstitial lung disease or an atypical infection Follow-up 6 weeks     Updated Medication List Outpatient Encounter Prescriptions as of 09/12/2014  Medication Sig  . albuterol (PROVENTIL) (2.5 MG/3ML) 0.083% nebulizer solution USE ONE VIAL IN NEBULIZER EVERY 6 HOURS AS NEEDED FOR WHEEZING  . Azelastine-Fluticasone (DYMISTA) 137-50 MCG/ACT SUSP Place 2 Squirts into the nose daily. In each nostril  . benzonatate (TESSALON) 200 MG capsule Take 1 capsule (200 mg total) by mouth 3 (three) times daily as needed for cough.  . chlorpheniramine-HYDROcodone (TUSSIONEX PENNKINETIC ER) 10-8 MG/5ML LQCR Take 5 mLs by mouth at bedtime as needed for cough.  . citalopram (CELEXA) 40 MG tablet Take 1 tablet (40 mg total) by mouth daily.  . clonazePAM (KLONOPIN) 0.5 MG tablet TAKE ONE TABLET BY MOUTH TWICE DAILY AS NEEDED FOR ANXIETY  . Eszopiclone 3 MG TABS TAKE ONE TABLET BY MOUTH AT BEDTIME. TAKE IMMEDIATELY BEFORE BEDTIME  . fexofenadine (ALLEGRA) 180 MG tablet Take 1 tablet (180 mg total) by mouth daily.  . Lactulose 20 GM/30ML SOLN 30 ml by mouth as needed for severe constipation  . mometasone-formoterol (DULERA) 200-5 MCG/ACT AERO Inhale 2 puffs into the lungs 2 (two) times daily.  . montelukast (SINGULAIR) 10 MG tablet Take 1 tablet (10 mg total) by mouth at bedtime.  . ondansetron (ZOFRAN ODT) 4 MG disintegrating tablet Take 1 tablet (4 mg total) by mouth every 8 (eight) hours as needed for nausea or vomiting.  . predniSONE (DELTASONE) 10 MG tablet   . Red Yeast Rice 600 MG CAPS Take by mouth 2 (two) times daily.   . roflumilast (DALIRESP) 500 MCG TABS tablet Take 1  tablet (500 mcg total) by mouth daily.  Marland Kitchen. Spacer/Aero-Holding Chambers (AEROCHAMBER PLUS FLO-VU) MISC USE AS DIRECTED  . SPIRIVA RESPIMAT 2.5 MCG/ACT AERS INHALE TWO SPRAY(S) BY MOUTH ONCE DAILY  . traMADol (ULTRAM) 50 MG tablet Take 1 tablet (50 mg total) by mouth every 6 (six) hours as needed (cough).  . traZODone (DESYREL) 50 MG tablet Take 50-100 mg by mouth at bedtime.  . [DISCONTINUED] lisinopril (PRINIVIL,ZESTRIL) 10 MG tablet Take 1 tablet (10 mg total) by mouth daily.  Marland Kitchen. losartan (COZAAR) 50 MG tablet Take 1 tablet (50 mg total) by mouth 2 (two) times daily.   No facility-administered encounter medications on file as of 09/12/2014.

## 2014-09-12 NOTE — Assessment & Plan Note (Signed)
Diane [name] is having yet another exacerbation of COPD but this was mild. She should be able to manage it with bronchodilators at home. I would like to treat her with prednisone but she has developed visual changes in the right eye recently which I worry may be related to all the prednisone she's been treated with recently (block, versus cataract).  She has very severe COPD and her recurrent exacerbations phenotype is very worrisome. I'm a bit frustrated that she stop taking the Roflumilast last month as that is intended to prevent exacerbations.  Plan: Resume roflumilast Continue bronchodilators as scheduled Use albuterol twice a day minimum Go see an eye doctor, call me with the results If the eye doctor does not find anything which is steroid related then I plan to treat her with prednisone for 6 weeks' time, 20 mg for 2 weeks, 10 mg for 2 weeks, 5 mg or 2 weeks We need to try to figure out why she keeps having recurrent exacerbations of COPD: To that and I would like to get a CBC with differential, serum IgE, and a CT scan to look for evidence of concomitant interstitial lung disease or an atypical infection Follow-up 6 weeks

## 2014-09-13 ENCOUNTER — Telehealth: Payer: Self-pay | Admitting: Pulmonary Disease

## 2014-09-13 ENCOUNTER — Encounter: Payer: Self-pay | Admitting: Pulmonary Disease

## 2014-09-13 LAB — IGE: IGE (IMMUNOGLOBULIN E), SERUM: 33 [IU]/mL (ref 0–100)

## 2014-09-13 NOTE — Telephone Encounter (Signed)
Pt's pharmacy is calling and stating there is an interaction between Zpack and Celexa.  MW - please advise if it's okay to take these together, BQ is off this afternoon. Thanks.

## 2014-09-13 NOTE — Telephone Encounter (Signed)
There is already a MyChart message from the pt about this. I spoke with the pt and let her know that I got the message to BQ to address. Nothing further was needed.

## 2014-09-13 NOTE — Telephone Encounter (Signed)
Ok to refill,  printed rx  

## 2014-09-13 NOTE — Telephone Encounter (Signed)
It won't hurt to stop the celexa x 5 days and go ahead and take the z pak

## 2014-09-13 NOTE — Telephone Encounter (Signed)
I have notified the pharmacy and the patient about this. Nothing further was needed.

## 2014-09-13 NOTE — Telephone Encounter (Signed)
Please advise. Thanks.  

## 2014-09-13 NOTE — Telephone Encounter (Signed)
BQ please advise.  

## 2014-09-13 NOTE — Telephone Encounter (Signed)
Faxed to pharmacy

## 2014-09-16 ENCOUNTER — Encounter: Payer: Self-pay | Admitting: Pulmonary Disease

## 2014-09-17 ENCOUNTER — Other Ambulatory Visit: Payer: Self-pay | Admitting: Internal Medicine

## 2014-09-17 NOTE — Telephone Encounter (Signed)
A user error has taken place.

## 2014-09-18 ENCOUNTER — Ambulatory Visit
Admission: RE | Admit: 2014-09-18 | Discharge: 2014-09-18 | Disposition: A | Discharge status: Home / Self Care | Payer: Managed Care, Other (non HMO) | Source: Ambulatory Visit | Attending: Pulmonary Disease | Admitting: Pulmonary Disease

## 2014-09-18 DIAGNOSIS — J441 Chronic obstructive pulmonary disease with (acute) exacerbation: Secondary | ICD-10-CM | POA: Insufficient documentation

## 2014-09-18 DIAGNOSIS — H539 Unspecified visual disturbance: Secondary | ICD-10-CM | POA: Diagnosis present

## 2014-09-19 MED ORDER — PREDNISONE 10 MG PO TABS: ORAL_TABLET | ORAL | Status: DC

## 2014-09-19 NOTE — Telephone Encounter (Signed)
Spoke with the pt  She c/o increased SOB, progressively worse since her last visit with Dr Kendrick FriesMcQuaid  She is waking up in the night having to use her neb  Cough has increased also- white sputum  Denies CP, chest tightness, f/c/s, wheezing She is asking for prednisone  She refused appt here due to transportation  She wants to know if Dulera could be causing blurred vision  She is trying to get a sooner eye appt  Dr Kendrick FriesMcQuaid, please advise, thanks!

## 2014-09-19 NOTE — Addendum Note (Signed)
Addended by: Christen ButterASKIN, Dalexa Gentz M on: 09/19/2014 04:24 PM   Modules accepted: Orders

## 2014-09-19 NOTE — Telephone Encounter (Signed)
Spoke with Dr Kendrick FriesMcQuaid  He states okay to call in pred taper  40 mg x 3 days  30 mg x 3 days  20 mg x 3 days  10 mg x 3 days then stop   Spoke with the pt and notified of this and she verbalized understanding  [facility]

## 2014-10-01 ENCOUNTER — Encounter: Payer: Self-pay | Admitting: Internal Medicine

## 2014-10-01 ENCOUNTER — Ambulatory Visit (INDEPENDENT_AMBULATORY_CARE_PROVIDER_SITE_OTHER): Payer: Managed Care, Other (non HMO) | Admitting: Internal Medicine

## 2014-10-01 VITALS — BP 126/80 | HR 90 | Temp 98.1°F | Resp 18 | Ht 66.0 in | Wt 136.2 lb

## 2014-10-01 DIAGNOSIS — I10 Essential (primary) hypertension: Secondary | ICD-10-CM | POA: Diagnosis not present

## 2014-10-01 DIAGNOSIS — J441 Chronic obstructive pulmonary disease with (acute) exacerbation: Secondary | ICD-10-CM | POA: Diagnosis not present

## 2014-10-01 MED ORDER — HYDROCOD POLST-CPM POLST ER 10-8 MG/5ML PO SUER: 5.0000 mL | Freq: Every evening | ORAL | Status: DC | PRN

## 2014-10-01 NOTE — Progress Notes (Signed)
Pre-visit discussion using our clinic review tool. No additional management support is needed unless otherwise documented below in the visit note.  

## 2014-10-01 NOTE — Progress Notes (Signed)
and felt more depfre  Subjective:  Patient ID: Diane [name], female    DOB: 1955-12-19  Age: 59 y.o. MRN: [id number]  CC: The primary encounter diagnosis was Essential hypertension. A diagnosis of COPD exacerbation was also pertinent to this visit.  HPI Diane [name] presents for follow up on recent blood pressure medication change.  She was evaluated by Dr Kendrick FriesMcquaid for a recent COPD exacerbation ,and her lisinopril was stopped.  Losartan was started. She was prescribed a prednisone taper which she finished yesterday. pharmacist told her to stop her citalopram for 5 days, which she did . But resumed it after 3 days because she was feeling more depressed   The previously noted visual changes did not worsen while on prednisone.    Has eye appt June 10th with Porfilio  Patient has been written out of work until Monday June 6th.  Seh continues to be short of breath with short walks of 500 feet or less, even with 02.  Appetite ok.     Outpatient Prescriptions Prior to Visit  Medication Sig Dispense Refill  . albuterol (PROVENTIL) (2.5 MG/3ML) 0.083% nebulizer solution USE ONE VIAL IN NEBULIZER EVERY 6 HOURS AS NEEDED FOR WHEEZING 180 mL 3  . Azelastine-Fluticasone (DYMISTA) 137-50 MCG/ACT SUSP Place 2 Squirts into the nose daily. In each nostril 23 g 11  . benzonatate (TESSALON) 200 MG capsule Take 1 capsule (200 mg total) by mouth 3 (three) times daily as needed for cough. 60 capsule 1  . citalopram (CELEXA) 40 MG tablet Take 1 tablet (40 mg total) by mouth daily. 90 tablet 0  . clonazePAM (KLONOPIN) 0.5 MG tablet TAKE ONE TABLET BY MOUTH TWICE DAILY AS NEEDED FOR ANXIETY 60 tablet 5  . Eszopiclone 3 MG TABS TAKE ONE TABLET BY MOUTH AT BEDTIME. TAKE IMMEDIATELY BEFORE BEDTIME 30 tablet 5  . Lactulose 20 GM/30ML SOLN 30 ml by mouth as needed for severe constipation 236 mL 2  . losartan (COZAAR) 50 MG tablet Take 1 tablet (50 mg total) by mouth 2 (two) times daily. 60 tablet 11  .  mometasone-formoterol (DULERA) 200-5 MCG/ACT AERO Inhale 2 puffs into the lungs 2 (two) times daily. 13 g 11  . montelukast (SINGULAIR) 10 MG tablet Take 1 tablet (10 mg total) by mouth at bedtime. 30 tablet 5  . ondansetron (ZOFRAN ODT) 4 MG disintegrating tablet Take 1 tablet (4 mg total) by mouth every 8 (eight) hours as needed for nausea or vomiting. 20 tablet 0  . predniSONE (DELTASONE) 10 MG tablet     . predniSONE (DELTASONE) 10 MG tablet 4 x 3 days, 3 x 3 days, 2 x 3 days, 1 x 3 days, then stop 30 tablet 0  . Red Yeast Rice 600 MG CAPS Take by mouth 2 (two) times daily.     . roflumilast (DALIRESP) 500 MCG TABS tablet Take 1 tablet (500 mcg total) by mouth daily. 30 tablet 5  . Spacer/Aero-Holding Chambers (AEROCHAMBER PLUS FLO-VU) MISC USE AS DIRECTED 1 each 5  . SPIRIVA RESPIMAT 2.5 MCG/ACT AERS INHALE TWO SPRAY(S) BY MOUTH ONCE DAILY 4 g 3  . traZODone (DESYREL) 50 MG tablet Take 50-100 mg by mouth at bedtime.    . fexofenadine (ALLEGRA) 180 MG tablet Take 1 tablet (180 mg total) by mouth daily. (Patient not taking: Reported on 10/01/2014) 90 tablet 1  . chlorpheniramine-HYDROcodone (TUSSIONEX PENNKINETIC ER) 10-8 MG/5ML LQCR Take 5 mLs by mouth at bedtime as needed for cough. (Patient not taking: Reported  on 10/01/2014) 180 mL 0  . traMADol (ULTRAM) 50 MG tablet Take 1 tablet (50 mg total) by mouth every 6 (six) hours as needed (cough). (Patient not taking: Reported on 10/01/2014) 40 tablet 0   No facility-administered medications prior to visit.    Review of Systems;  Patient denies headache, fevers, malaise, unintentional weight loss, skin rash, eye pain, sinus congestion and sinus pain, sore throat, dysphagia,  hemoptysis , cough, dyspnea, wheezing, chest pain, palpitations, orthopnea, edema, abdominal pain, nausea, melena, diarrhea, constipation, flank pain, dysuria, hematuria, urinary  Frequency, nocturia, numbness, tingling, seizures,  Focal weakness, Loss of consciousness,  Tremor,  insomnia, depression, anxiety, and suicidal ideation.      Objective:  BP 126/80 mmHg  Pulse 90  Temp(Src) 98.1 F (36.7 C) (Oral)  Resp 18  Ht  (1.676 m)  Wt 136 lb 4 oz (61.803 kg)  BMI 22.00 kg/m2  SpO2 99%  BP Readings from Last 3 Encounters:  10/01/14 126/80  09/12/14 128/76  09/02/14 108/70    Wt Readings from Last 3 Encounters:  10/01/14 136 lb 4 oz (61.803 kg)  09/12/14 136 lb (61.689 kg)  09/02/14 137 lb 12 oz (62.483 kg)    General appearance: alert, cooperative and appears stated age Ears: normal TM's and external ear canals both ears Throat: lips, mucosa, and tongue normal; teeth and gums normal Neck: no adenopathy, no carotid bruit, supple, symmetrical, trachea midline and thyroid not enlarged, symmetric, no tenderness/mass/nodules Back: symmetric, no curvature. ROM normal. No CVA tenderness. Lungs: clear to auscultation bilaterally Heart: regular rate and rhythm, S1, S2 normal, no murmur, click, rub or gallop Abdomen: soft, non-tender; bowel sounds normal; no masses,  no organomegaly Pulses: 2+ and symmetric Skin: Skin color, texture, turgor normal. No rashes or lesions Lymph nodes: Cervical, supraclavicular, and axillary nodes normal.  No results found for: HGBA1C  Lab Results  Component Value Date   CREATININE 0.69 08/23/2014   CREATININE 0.85 08/22/2014   CREATININE 0.68 08/19/2014    Lab Results  Component Value Date   WBC 3.9 08/23/2014   HGB 12.7 08/23/2014   HCT 38.8 08/23/2014   PLT 330 08/27/2014   GLUCOSE 130* 08/23/2014   CHOL 253* 01/10/2014   TRIG 128.0 01/10/2014   HDL 71.10 01/10/2014   LDLDIRECT 138.2 08/13/2011   LDLCALC 156* 01/10/2014   ALT 22 08/23/2014   AST 25 08/23/2014   NA 136 08/23/2014   K 4.0 08/23/2014   CL 102 08/23/2014   CREATININE 0.69 08/23/2014   BUN 6 08/23/2014   CO2 24 08/23/2014   TSH 1.61 01/10/2014    Ct Chest High Resolution  09/18/2014   CLINICAL DATA:  Recurrent COPD exacerbations.   EXAM: CT CHEST WITHOUT CONTRAST  TECHNIQUE: Multidetector CT imaging of the chest was performed following the standard protocol without intravenous contrast. High resolution imaging of the lungs, as well as inspiratory and expiratory imaging, was performed.  COMPARISON:  06/15/2012.  FINDINGS: Mediastinum/Nodes: Mediastinal lymph nodes are not enlarged by CT size criteria. Hilar regions are difficult to definitively evaluate without IV contrast but appear grossly unremarkable. No axillary adenopathy. Atherosclerotic calcification of the arterial vasculature, including coronary arteries. Heart size normal. No pericardial effusion.  Lungs/Pleura: Moderate to severe centrilobular emphysema. Respiratory motion at the lung bases degrades image quality. No definite subpleural reticulation, traction bronchiectasis/bronchiolectasis, ground-glass, architectural distortion or honeycombing. No air trapping. No pleural fluid. Airway is unremarkable.  Upper abdomen: Visualized portions of the liver and right adrenal gland are unremarkable.  A fluid density lesion in the left adrenal gland measures approximately 1.4 x 2.6 cm, as before. Visualized portions of the left kidney, spleen, pancreas, stomach and bowel are grossly unremarkable. No upper abdominal adenopathy.  Musculoskeletal: No worrisome lytic or sclerotic lesions. Mild compression deformity of the superior endplate of T11 appears new from 07/17/2014.  IMPRESSION: 1. No evidence of interstitial lung disease. 2. T11 superior endplate compression fracture appears new from 07/17/2014. 3. Coronary artery calcification. 4. Left adrenal adenoma.   Electronically Signed   By: Leanna Battles M.D.   On: 09/18/2014 14:02    Assessment & Plan:   Problem List Items Addressed This Visit    Hypertension - Primary    Well controlled since medication switch from lisinopril to losartan .  No changes today .  BP 126/80 mmHg  Pulse 90  Temp(Src) 98.1 F (36.7 C) (Oral)  Resp  18  Ht  (1.676 m)  Wt 136 lb 4 oz (61.803 kg)  BMI 22.00 kg/m2  SpO2 99%      COPD exacerbation    Slow improvementnoted.  Lungs are moving fair air today.   She will need to be less breathless with exertion to return to work on Monday.       Relevant Medications   chlorpheniramine-HYDROcodone (TUSSIONEX PENNKINETIC ER) 10-8 MG/5ML SUER      I have discontinued Ms. Pizzini's traMADol and chlorpheniramine-HYDROcodone. I am also having her start on chlorpheniramine-HYDROcodone. Additionally, I am having her maintain her Red Yeast Rice, Azelastine-Fluticasone, fexofenadine, traZODone, benzonatate, mometasone-formoterol, AEROCHAMBER PLUS FLO-VU, Eszopiclone, citalopram, roflumilast, SPIRIVA RESPIMAT, ondansetron, albuterol, predniSONE, Lactulose, montelukast, clonazePAM, losartan, and predniSONE.  A total of 25 minutes of face to face time was spent with patient more than half of which was spent in counselling about the above mentioned conditions  and coordination of care  Meds ordered this encounter  Medications  . chlorpheniramine-HYDROcodone (TUSSIONEX PENNKINETIC ER) 10-8 MG/5ML SUER    Sig: Take 5 mLs by mouth at bedtime as needed for cough.    Dispense:  140 mL    Refill:  0    Medications Discontinued During This Encounter  Medication Reason  . chlorpheniramine-HYDROcodone (TUSSIONEX PENNKINETIC ER) 10-8 MG/5ML [facility] Completed Course  . traMADol (ULTRAM) 50 MG tablet Patient Preference    Follow-up: No Follow-up on file.   Sherlene Shams, MD

## 2014-10-01 NOTE — Patient Instructions (Signed)
I have refilled the tussionex  You can reduce the oxygen  to 2 L/min as long as your sats are above 92%  Reduce your evening 02 to 2 L /min as well so your CPAP functions better   Have hubby check you r pulse ox when you are alseep  Call on Friday to determine if ready to go back to work   Your BP is well controlled on losartan 50 mg daily

## 2014-10-02 NOTE — Assessment & Plan Note (Signed)
Well controlled since medication switch from lisinopril to losartan .  No changes today .  BP 126/80 mmHg  Pulse 90  Temp(Src) 98.1 F (36.7 C) (Oral)  Resp 18  Ht 5\' 6"  (1.676 m)  Wt 136 lb 4 oz (61.803 kg)  BMI 22.00 kg/m2  SpO2 99%

## 2014-10-02 NOTE — Assessment & Plan Note (Signed)
Slow improvementnoted.  Lungs are moving fair air today.   She will need to be less breathless with exertion to return to work on Monday.

## 2014-10-10 ENCOUNTER — Telehealth: Payer: Self-pay | Admitting: Internal Medicine

## 2014-10-10 NOTE — Telephone Encounter (Signed)
[facility] [place] [facility] - [place] TELEPHONE ADVICE RECORD [facility]  Patient Name: Diane [name]  DOB: May 21, 1955    Initial Comment Caller states she is having problems with breathing; wheezing.   Nurse Assessment  Nurse: Josie Saunders RN, Erskine Squibb Date/Time Lamount Cohen Time): 10/10/2014 4:46:51 PM  Confirm and document reason for call. If symptomatic, describe symptoms. ---Caller states she feels tight in her chest. Using O2 by cannula without bubbler. Sat 95%. Felt wheezing in nasal passages Did breathing treatment and with Albuterol inhaler and wheezing better but still has tightness in chest. Has blurry eye. Has eye appointment tomorrow. Uses breathing treatments every 4-6 h  Has the patient traveled out of the country within the last 30 days? ---No  Does the patient require triage? ---Yes  Related visit to physician within the last 2 weeks? ---Yes   Dr Willa Frater last Tuesday  Does the PT have any chronic conditions? (i.e. diabetes, asthma, etc.) ---Yes  List chronic conditions. ---Asthma/COPD Eye appointment on 6/10. Has been on work disability. Returned to work Has been off Prednisone and has light wheeze.     Guidelines    Guideline Title Affirmed Question Affirmed Notes  Asthma Attack [1] Wheezing or coughing AND [2] hasn't used neb or inhaler twice AND [3] it's available    Final Disposition User   Urgent Home Treatment with Follow-Up Call Josie Saunders, RN, Erskine Squibb    Comments  Caller informed of office notification and on call notification. Advised she will need to be seen in ED if needs to go back on her Prednisone. Reported second breathing treatment 20 minutes apart improved her breathing and is not wheezing now. Verbalized understanding of instructions.

## 2014-10-11 ENCOUNTER — Encounter: Payer: Self-pay | Admitting: Pulmonary Disease

## 2014-10-11 MED ORDER — PREDNISONE 10 MG PO TABS: ORAL_TABLET | ORAL | Status: DC

## 2014-10-11 NOTE — Telephone Encounter (Signed)
Pt returning call.Diane [name] ° °

## 2014-10-11 NOTE — Telephone Encounter (Signed)
Patient returned call and stated she is trying to see pulmonary but if symptoms become worse she will go to the ED immediately. Patient has just returned to work and is trying to avoid being admitted back to hospital. Patient is waiting for call back from Dr. Ulyses Jarred office. FYI

## 2014-10-11 NOTE — Telephone Encounter (Signed)
Left message for patient to return call to office. 

## 2014-10-11 NOTE — Telephone Encounter (Signed)
Per BQ- call in pred taper 40 mg x 3 days, 30 mg x 3 days, 20 mg x 3 days, and 10 mg x 3 days and call next wk if not improving  I have sent rx and spoke with the pt to inform her  She verbalized understanding and nothing further needed

## 2014-10-11 NOTE — Telephone Encounter (Signed)
Can be reached @ [phone]

## 2014-10-11 NOTE — Telephone Encounter (Signed)
Patient sent message through MyChart.  Attempted to call patient, left message for patient to call back with more information regarding her symptoms.

## 2014-10-15 ENCOUNTER — Encounter: Payer: Self-pay | Admitting: Internal Medicine

## 2014-10-17 ENCOUNTER — Ambulatory Visit (INDEPENDENT_AMBULATORY_CARE_PROVIDER_SITE_OTHER): Payer: Managed Care, Other (non HMO) | Admitting: Nurse Practitioner

## 2014-10-17 ENCOUNTER — Encounter: Payer: Self-pay | Admitting: Nurse Practitioner

## 2014-10-17 VITALS — BP 118/84 | HR 101 | Temp 98.0°F | Resp 16 | Ht 66.0 in | Wt 134.8 lb

## 2014-10-17 DIAGNOSIS — J441 Chronic obstructive pulmonary disease with (acute) exacerbation: Secondary | ICD-10-CM | POA: Diagnosis not present

## 2014-10-17 MED ORDER — DOXYCYCLINE HYCLATE 100 MG PO TABS: 100.0000 mg | ORAL_TABLET | Freq: Two times a day (BID) | ORAL | Status: DC

## 2014-10-17 NOTE — Progress Notes (Signed)
Pre visit review using our clinic review tool, if applicable. No additional management support is needed unless otherwise documented below in the visit note. 

## 2014-10-17 NOTE — Patient Instructions (Signed)
Continue prednisone, add they doxycyline, probiotics while on antibiotics and 2 weeks after finishing. Call us if not helpful (or Dr. Kendrick Fries).

## 2014-10-17 NOTE — Progress Notes (Signed)
   Subjective:    Patient ID: Diane [name], female    DOB: 08-Jun-1955, 59 y.o.   MRN: [id number]  HPI  Diane [name] is a 59 yo female with a CC of SOB.  1) Neb tx 1 hr ago- somewhat helpful doing every 4 hours  First of the 3 day round  Coughing up green stuff   Went down to 2 L and was working to keep moving down.   Review of Systems  Constitutional: Negative for fever, chills, diaphoresis and fatigue.  Respiratory: Positive for chest tightness, shortness of breath and wheezing.   Cardiovascular: Negative for chest pain, palpitations and leg swelling.  Gastrointestinal: Negative for nausea, vomiting and diarrhea.  Skin: Negative for rash.  Neurological: Negative for dizziness, weakness, numbness and headaches.  Psychiatric/Behavioral: The patient is not nervous/anxious.        Objective:   Physical Exam  Constitutional: She is oriented to person, place, and time. She appears well-developed and well-nourished. No distress.  BP 118/84 mmHg  Pulse 101  Temp(Src) 98 F (36.7 C)  Resp 16  Ht 5\' 6"  (1.676 m)  Wt 134 lb 12.8 oz (61.145 kg)  BMI 21.77 kg/m2  SpO2 92%   HENT:  Head: Normocephalic and atraumatic.  Right Ear: External ear normal.  Left Ear: External ear normal.  Cardiovascular: Normal rate, regular rhythm, normal heart sounds and intact distal pulses.  Exam reveals no gallop and no friction rub.   No murmur heard. Pulmonary/Chest: Effort normal. No respiratory distress. She has wheezes. She has no rales. She exhibits no tenderness.  Neurological: She is alert and oriented to person, place, and time. No cranial nerve deficit. She exhibits normal muscle tone. Coordination normal.  Skin: Skin is warm and dry. No rash noted. She is not diaphoretic.  Psychiatric: She has a normal mood and affect. Her behavior is normal. Judgment and thought content normal.      Assessment & Plan:

## 2014-10-20 ENCOUNTER — Encounter: Payer: Self-pay | Admitting: Nurse Practitioner

## 2014-10-20 NOTE — Assessment & Plan Note (Signed)
COPD exacerbation still occuring. Able to complete sentences today. Asked her to continue taper, up O2 to 3 L and add doxycyline. Asked her to follow up with [place] or Dr. Kendrick Fries or seek emergency care if worsening.

## 2014-10-23 ENCOUNTER — Other Ambulatory Visit: Payer: Self-pay | Admitting: Internal Medicine

## 2014-10-23 NOTE — Telephone Encounter (Signed)
Last OV 6.16.16.  Please advise refill 

## 2014-10-24 NOTE — Telephone Encounter (Signed)
Ok to refill,  Authorized in epic 

## 2014-10-28 ENCOUNTER — Telehealth: Payer: Self-pay | Admitting: Internal Medicine

## 2014-10-28 ENCOUNTER — Encounter: Payer: Self-pay | Admitting: Internal Medicine

## 2014-10-28 NOTE — Telephone Encounter (Signed)
Letter in quick sign 

## 2014-10-28 NOTE — Telephone Encounter (Signed)
[facility] to [facility]

## 2014-10-29 NOTE — Telephone Encounter (Signed)
Letter signed and faxed to [facility].

## 2014-10-31 ENCOUNTER — Ambulatory Visit: Payer: Managed Care, Other (non HMO) | Admitting: Nurse Practitioner

## 2014-10-31 ENCOUNTER — Other Ambulatory Visit: Payer: Self-pay | Admitting: *Deleted

## 2014-10-31 MED ORDER — ALBUTEROL SULFATE (2.5 MG/3ML) 0.083% IN NEBU: INHALATION_SOLUTION | RESPIRATORY_TRACT | Status: DC

## 2014-11-05 ENCOUNTER — Encounter: Payer: Self-pay | Admitting: Pulmonary Disease

## 2014-11-05 ENCOUNTER — Ambulatory Visit (INDEPENDENT_AMBULATORY_CARE_PROVIDER_SITE_OTHER): Payer: Managed Care, Other (non HMO) | Admitting: Pulmonary Disease

## 2014-11-05 VITALS — BP 128/80 | HR 108 | Ht 66.0 in | Wt 132.0 lb

## 2014-11-05 DIAGNOSIS — J432 Centrilobular emphysema: Secondary | ICD-10-CM

## 2014-11-05 MED ORDER — PREDNISONE 10 MG PO TABS: ORAL_TABLET | ORAL | Status: DC

## 2014-11-05 NOTE — Progress Notes (Signed)
Subjective:    Patient ID: Diane [name], female    DOB: 1956-02-18, 59 y.o.   MRN: [id number]   Synopsis: Diane [name] first saw the [facility] pulmonary clinic in July 2014 for COPD. She has had multiple hospitalizations and doctor visits for exacerbations of COPD. As of October of 2014 she continues to smoke cigarettes. A 2014 FEV1 was 55% predicted. In 2015 she continued to have multiple exacerbations and she was hospitalized in September. She was hospitalized for 9 days for a COPD exacerbation, during that time she was found to have sputum growing Pseudomonas. After that hospitalization she quit smoking and she started using nocturnal noninvasive mechanical ventilation with oxygen at home. She also uses oxygen on exertion.   HPI  Chief Complaint  Patient presents with  . Follow-up    Pt c/o increased sob, sometimes prod cough with clear mucus.  Pt recently went back to work.  Having to use nebulizer through the night.     She has been worse lately again.  She has been having more shortness of breath since last Wednesday, dyspnea with sleep, having to get up in the middle of the night.  No fever, no chills. She is bringing up clear, thick mucus.  She is taking the [place], Spiriva and Daliresp.   She is supposed to have cataract surgery, she was told that this was due to her prednisone.  She went back to work on June 6, and started having cough, dyspnea, wheezing within three days.  Past Medical History  Diagnosis Date  . COPD (chronic obstructive pulmonary disease)   . Hyperlipidemia   . Tobacco abuse   . Depression   . Anxiety   . Hypertension      Review of Systems  Constitutional: Negative for fever, activity change and fatigue.  HENT: Negative for congestion, postnasal drip, rhinorrhea and sinus pressure.   Respiratory: Positive for cough and shortness of breath. Negative for wheezing.   Cardiovascular: Negative for chest pain, palpitations and leg swelling.        Objective:   Physical Exam  Filed Vitals:   11/05/14 1402  BP: 128/80  Pulse: 108  Height: 5\' 6"  (1.676 m)  Weight: 132 lb (59.875 kg)  SpO2: 91%  RA  Gen: no acute distress, frequent cough HEENT: NCAT, EOMi PULM: Slight wheezing bilaterally, poor air movement CV: RRR, no mgr AB: BS+, soft, nontender Ext: warm, no edema  February 2014 CT chest at [facility]>> no pulmonary embolism, there is moderate to severe centrilobular emphysema in the upper lobes right greater than left bilaterally, no other market pulmonary abnormality March 2014 full pulmonary function testing at [facility]>> ratio 43%, FEV1 1.42 L (55% predicted), TLC 5.74 L (108% predicted), residual volume 2.46 L (125% predicted), DLCO 52% predicted, FEV1 improved more than 12% with a bronchodilator September 2015 chest x-ray images personally reviewed> no evidence of pulmonary parenchymal disease with the exception of emphysema, normal heart contour  April 2016 hospital discharge summary and lab work reviewed, no eosinophilia during that time Chest x-rays from April 2016 reviewed, emphysema no infiltrate noted  May 2016 high resolution CT chest images personally reviewed with severe emphysema, no other pulmonary parenchymal abnormality     Assessment & Plan:   GOLD GRADE D COPD She has severe recurrent COPD with recurrent exacerbations. Unfortunately she is experiencing another exacerbation now. She quit smoking over a year ago, she does not have serum eosinophil elevation, her serum IgE  level is normal, and there is no evidence of anything other than severe centrilobular emphysema on a recent high-resolution CT chest. All of this signifies that she has very severe COPD and a poor prognosis. Every time she comes back to work she gets another exacerbation.  Today I need to treat her for another exacerbation.  I expanded to her today that she needs to have at least 6  months off of work to try to break the cycle of repeated exacerbations. Of note, she's had 2 hospitalizations in the last 6 months in addition to multiple rounds of steroid for recurrent exacerbations of COPD. This is all occurring despite compliance with maximal medical therapy for COPD  Plan: Prednisone taper again Continue inhaled therapies Continue Roflumilast Apply for disability for 6 months Follow-up 4 weeks  Greater than 25 minutes spent in direct consultation.      Updated Medication List Outpatient Encounter Prescriptions as of 11/05/2014  Medication Sig  . albuterol (PROVENTIL) (2.5 MG/3ML) 0.083% nebulizer solution USE ONE VIAL IN NEBULIZER EVERY 6 HOURS AS NEEDED FOR WHEEZING  . Azelastine-Fluticasone (DYMISTA) 137-50 MCG/ACT SUSP Place 2 Squirts into the nose daily. In each nostril  . benzonatate (TESSALON) 200 MG capsule Take 1 capsule (200 mg total) by mouth 3 (three) times daily as needed for cough.  . chlorpheniramine-HYDROcodone (TUSSIONEX PENNKINETIC ER) 10-8 MG/5ML SUER Take 5 mLs by mouth at bedtime as needed for cough.  . citalopram (CELEXA) 40 MG tablet Take 1 tablet (40 mg total) by mouth daily.  . clonazePAM (KLONOPIN) 0.5 MG tablet TAKE ONE TABLET BY MOUTH TWICE DAILY AS NEEDED FOR ANXIETY  . Eszopiclone 3 MG TABS TAKE ONE TABLET BY MOUTH AT BEDTIME. TAKE IMMEDIATELY BEFORE BEDTIME  . fexofenadine (ALLEGRA) 180 MG tablet Take 1 tablet (180 mg total) by mouth daily.  . Lactulose 20 GM/30ML SOLN 30 ml by mouth as needed for severe constipation  . losartan (COZAAR) 50 MG tablet Take 1 tablet (50 mg total) by mouth 2 (two) times daily.  . mometasone-formoterol (DULERA) 200-5 MCG/ACT AERO Inhale 2 puffs into the lungs 2 (two) times daily.  . montelukast (SINGULAIR) 10 MG tablet Take 1 tablet (10 mg total) by mouth at bedtime.  . ondansetron (ZOFRAN ODT) 4 MG disintegrating tablet Take 1 tablet (4 mg total) by mouth every 8 (eight) hours as needed for nausea or  vomiting.  . Red Yeast Rice 600 MG CAPS Take by mouth 2 (two) times daily.   . roflumilast (DALIRESP) 500 MCG TABS tablet Take 1 tablet (500 mcg total) by mouth daily.  Marland Kitchen. Spacer/Aero-Holding Chambers (AEROCHAMBER PLUS FLO-VU) MISC USE AS DIRECTED  . SPIRIVA RESPIMAT 2.5 MCG/ACT AERS INHALE TWO SPRAY(S) BY MOUTH ONCE DAILY  . traZODone (DESYREL) 50 MG tablet Take 50-100 mg by mouth at bedtime.  . VENTOLIN HFA 108 (90 BASE) MCG/ACT inhaler INHALE TWO PUFFS BY MOUTH EVERY 4 HOURS AS NEEDED FOR WHEEZING OR SHORTNESS OF BREATH  . [DISCONTINUED] predniSONE (DELTASONE) 10 MG tablet   . [DISCONTINUED] predniSONE (DELTASONE) 10 MG tablet 4 x 3 days, 3 x 3 days, 2 x 3 days, 1 x 3 days  . predniSONE (DELTASONE) 10 MG tablet Take 40mg  po daily for 3 days, then take 30mg  po daily for 3 days, then take 20mg  po daily for two days, then take 10mg  po daily for 2 days  . [DISCONTINUED] doxycycline (VIBRA-TABS) 100 MG tablet Take 1 tablet (100 mg total) by mouth 2 (two) times daily. (Patient not taking: Reported  on 11/05/2014)  . [DISCONTINUED] predniSONE (DELTASONE) 10 MG tablet 4 x 3 days, 3 x 3 days, 2 x 3 days, 1 x 3 days, then stop (Patient not taking: Reported on 10/17/2014)   No facility-administered encounter medications on file as of 11/05/2014.

## 2014-11-05 NOTE — Patient Instructions (Signed)
You need to be out of work for 6 months, we will start filling out the paperwork Keep using her oxygen as you're doing Take the inhalers as you're doing Take the Roflumilast as you're doing Take the prednisone taper We will see you back in 4 weeks or sooner if needed

## 2014-11-05 NOTE — Assessment & Plan Note (Signed)
She has severe recurrent COPD with recurrent exacerbations. Unfortunately she is experiencing another exacerbation now. She quit smoking over a year ago, she does not have serum eosinophil elevation, her serum IgE level is normal, and there is no evidence of anything other than severe centrilobular emphysema on a recent high-resolution CT chest. All of this signifies that she has very severe COPD and a poor prognosis. Every time she comes back to work she gets another exacerbation.  Today I need to treat her for another exacerbation.  I expanded to her today that she needs to have at least 6 months off of work to try to break the cycle of repeated exacerbations. Of note, she's had 2 hospitalizations in the last 6 months in addition to multiple rounds of steroid for recurrent exacerbations of COPD. This is all occurring despite compliance with maximal medical therapy for COPD  Plan: Prednisone taper again Continue inhaled therapies Continue Roflumilast Apply for disability for 6 months Follow-up 4 weeks  Greater than 25 minutes spent in direct consultation.

## 2014-11-12 ENCOUNTER — Telehealth: Payer: Self-pay | Admitting: Pulmonary Disease

## 2014-11-12 NOTE — Telephone Encounter (Signed)
After reviewing BQ 11-05-14 OV note; I have placed forms in BQ look at stack. Pt was made aware that BQ is not in the office this week and will return on Monday 11-18-14 for PM clinic. Pt will contact her FMLA company to get extension on paperwork. Will forward to BQ, and [name].

## 2014-11-18 NOTE — Telephone Encounter (Signed)
Form has been received and placed in BQ's look at folder.  Will hold in my box to follow up on.

## 2014-11-19 ENCOUNTER — Encounter: Payer: Self-pay | Admitting: Internal Medicine

## 2014-11-20 ENCOUNTER — Encounter: Payer: Self-pay | Admitting: Internal Medicine

## 2014-11-20 ENCOUNTER — Encounter: Payer: Self-pay | Admitting: Pulmonary Disease

## 2014-11-20 MED ORDER — ALBUTEROL SULFATE HFA 108 (90 BASE) MCG/ACT IN AERS: INHALATION_SPRAY | RESPIRATORY_TRACT | Status: DC

## 2014-11-20 MED ORDER — PREDNISONE 10 MG PO TABS: ORAL_TABLET | ORAL | Status: DC

## 2014-11-20 NOTE — Telephone Encounter (Signed)
Patient called pulmonology 

## 2014-11-20 NOTE — Telephone Encounter (Signed)
Repeated exacerbations noted Prednisone 10 mg tabs  Take 2 tabs daily with food x 5ds, then 1 tab daily with food x 5ds then STOP Please make sure she has supply of albuterol rescue inhaler Please forward message to Dr. Kendrick Friesmcquaid

## 2014-11-20 NOTE — Telephone Encounter (Signed)
Patient called pulmonology

## 2014-11-20 NOTE — Telephone Encounter (Signed)
Forwarding to BQ just as FYI.  Nothing further needed at this time.

## 2014-11-20 NOTE — Telephone Encounter (Signed)
Will send Dr Vassie LollAlva, as Dr Kendrick FriesMcQuaid is not available with afternoon.

## 2014-11-20 NOTE — Telephone Encounter (Signed)
Dr Kendrick FriesMcQuaid, the pt is c/o increased SOB and wheezing since she finished pred approx 1 wk ago  She is using neb every 4 hours  Also has some chest tightness- neb helps Has cough with white sputum She is asking for more pred Please advise, thanks!

## 2014-11-21 ENCOUNTER — Telehealth: Payer: Self-pay | Admitting: Pulmonary Disease

## 2014-11-21 ENCOUNTER — Encounter: Payer: Self-pay | Admitting: Pulmonary Disease

## 2014-11-21 NOTE — Telephone Encounter (Signed)
Phone note was created and sent to triage

## 2014-11-21 NOTE — Telephone Encounter (Signed)
[phone], wanting to speak to [facility] before she takes paper work to [employer] office for her to pick up

## 2014-11-21 NOTE — Telephone Encounter (Signed)
Called spoke with pt. She reports Diane [name] was going to be sending her disability paperwork to [employer]. She wants to know if this was done yet. Please advise ashley? thanks

## 2014-11-21 NOTE — Telephone Encounter (Signed)
These forms have been filled out by BQ and returned to me.  Pt initiated a mychart message thread to check the status of this.  I've informed her it is ready for her pickup and am waiting to hear if she wants to pick this up from the office or have it mailed to her.  Please see patient email on this.

## 2014-11-21 NOTE — Telephone Encounter (Signed)
Spoke with pt, advised that her forms were taken to [employer] office with [employer] this afternoon and are ready for pickup.  Pt aware.  Nothing further needed.

## 2014-11-22 ENCOUNTER — Other Ambulatory Visit: Payer: Managed Care, Other (non HMO)

## 2014-12-03 ENCOUNTER — Encounter: Payer: Self-pay | Admitting: Cardiology

## 2014-12-04 ENCOUNTER — Other Ambulatory Visit: Payer: Self-pay | Admitting: Internal Medicine

## 2014-12-05 ENCOUNTER — Ambulatory Visit: Payer: Managed Care, Other (non HMO) | Admitting: Internal Medicine

## 2014-12-05 ENCOUNTER — Other Ambulatory Visit: Payer: Self-pay

## 2014-12-05 MED ORDER — ALBUTEROL SULFATE (2.5 MG/3ML) 0.083% IN NEBU: INHALATION_SOLUTION | RESPIRATORY_TRACT | Status: DC

## 2014-12-06 ENCOUNTER — Encounter: Payer: Self-pay | Admitting: Internal Medicine

## 2014-12-06 ENCOUNTER — Encounter: Payer: Self-pay | Admitting: Nurse Practitioner

## 2014-12-06 ENCOUNTER — Other Ambulatory Visit: Payer: Self-pay | Admitting: Nurse Practitioner

## 2014-12-06 MED ORDER — PREDNISONE 10 MG PO TABS: ORAL_TABLET | ORAL | Status: DC

## 2014-12-16 ENCOUNTER — Encounter: Payer: Self-pay | Admitting: Internal Medicine

## 2014-12-16 ENCOUNTER — Encounter: Payer: Self-pay | Admitting: *Deleted

## 2014-12-16 ENCOUNTER — Encounter: Payer: Self-pay | Admitting: Pulmonary Disease

## 2014-12-16 ENCOUNTER — Ambulatory Visit (INDEPENDENT_AMBULATORY_CARE_PROVIDER_SITE_OTHER): Payer: Managed Care, Other (non HMO) | Admitting: Pulmonary Disease

## 2014-12-16 VITALS — BP 132/84 | HR 74 | Ht 67.0 in | Wt 132.0 lb

## 2014-12-16 DIAGNOSIS — J432 Centrilobular emphysema: Secondary | ICD-10-CM

## 2014-12-16 DIAGNOSIS — F17200 Nicotine dependence, unspecified, uncomplicated: Secondary | ICD-10-CM

## 2014-12-16 DIAGNOSIS — J439 Emphysema, unspecified: Secondary | ICD-10-CM

## 2014-12-16 DIAGNOSIS — Z720 Tobacco use: Secondary | ICD-10-CM

## 2014-12-16 NOTE — Progress Notes (Signed)
Subjective:    Patient ID: Diane [name], female    DOB: 05-27-1955, 59 y.o.   MRN: [id number]   Synopsis: Diane [name] first saw the [facility] pulmonary clinic in July 2014 for COPD. She has had multiple hospitalizations and doctor visits for exacerbations of COPD. As of October of 2014 she continues to smoke cigarettes. A 2014 FEV1 was 55% predicted. In 2015 she continued to have multiple exacerbations and she was hospitalized in September. She was hospitalized for 9 days for a COPD exacerbation, during that time she was found to have sputum growing Pseudomonas. After that hospitalization she quit smoking and she started using nocturnal noninvasive mechanical ventilation with oxygen at home. She also uses oxygen on exertion.   HPI  Chief Complaint  Patient presents with  . Follow-up    COPD; been on Prednisone over last month off and on by PCP; did [place] this am; SOB w/activity; cough, prod at times, thick, white mucus;      Past Medical History  Diagnosis Date  . COPD (chronic obstructive pulmonary disease)   . Hyperlipidemia   . Tobacco abuse   . Depression   . Anxiety   . Hypertension     Recently completed a course of prednisone for AECOPD. Although she initially reported that she had quit smoking, she admits to the occasional cigarette. She is scheduled for cataract surgery in the near future. Presently, she feels at her baseline with moderate DOE. She wears her O2 most of the day and night.   Review of Systems  Constitutional: Negative for fever, activity change and fatigue.  HENT: Negative for congestion, postnasal drip, rhinorrhea and sinus pressure.   Respiratory: Positive for cough and shortness of breath. Negative for wheezing.   Cardiovascular: Negative for chest pain, palpitations and leg swelling.       Objective:   Physical Exam  Filed Vitals:   12/16/14 0922  BP: 132/84  Pulse: 74  Height: 5\' 7"  (1.702 m)  Weight: 59.875 kg (132 lb)   SpO2: 100%  RA  Gen: no acute distress HEENT: NCAT, EOMI PULM: Slightly diminished BS, no adventitious sounds CV: Reg, no M AB: BS+, soft, nontender Ext: warm, no edema  February 2014 [facility] at [facility]>> no pulmonary embolism, there is moderate to severe centrilobular emphysema in the upper lobes right greater than left bilaterally, no other market pulmonary abnormality March 2014 full pulmonary function testing at [facility]>> ratio 43%, FEV1 1.42 L (55% predicted), TLC 5.74 L (108% predicted), residual volume 2.46 L (125% predicted), DLCO 52% predicted, FEV1 improved more than 12% with a bronchodilator September 2015 chest x-ray images personally reviewed> no evidence of pulmonary parenchymal disease with the exception of emphysema, normal heart contour  April 2016 hospital discharge summary and lab work reviewed, no eosinophilia during that time Chest x-rays from April 2016 reviewed, emphysema no infiltrate noted  May 2016 high resolution CT chest images personally reviewed with severe emphysema, no other pulmonary parenchymal abnormality     Assessment & Plan:  COPD, moderate Frequent exacerbations Continued occasional smoker  I have counseled re: need for absolute smoking abstinence She is to continue her current medical regimen as listed below F/U in four weeks to readdress the smoking issue.  If we are still having difficulty achieving adequate control of her symptoms, consider changing maintenance regimen to nebulized budesonide and arformoterol    Updated Medication List Outpatient Encounter Prescriptions as of 12/16/2014  Medication Sig  .  albuterol (PROVENTIL) (2.5 MG/3ML) 0.083% nebulizer solution USE ONE VIAL IN NEBULIZER EVERY 6 HOURS AS NEEDED FOR WHEEZING  . albuterol (VENTOLIN HFA) 108 (90 BASE) MCG/ACT inhaler INHALE TWO PUFFS BY MOUTH EVERY 4 HOURS AS NEEDED FOR WHEEZING OR SHORTNESS OF BREATH  . benzonatate  (TESSALON) 200 MG capsule Take 1 capsule (200 mg total) by mouth 3 (three) times daily as needed for cough.  . citalopram (CELEXA) 40 MG tablet Take 1 tablet (40 mg total) by mouth daily.  . clonazePAM (KLONOPIN) 0.5 MG tablet TAKE ONE TABLET BY MOUTH TWICE DAILY AS NEEDED FOR ANXIETY  . Eszopiclone 3 MG TABS TAKE ONE TABLET BY MOUTH AT BEDTIME. TAKE IMMEDIATELY BEFORE BEDTIME  . fexofenadine (ALLEGRA) 180 MG tablet Take 1 tablet (180 mg total) by mouth daily.  . Lactulose 20 GM/30ML SOLN 30 ml by mouth as needed for severe constipation  . losartan (COZAAR) 50 MG tablet Take 1 tablet (50 mg total) by mouth 2 (two) times daily.  . mometasone-formoterol (DULERA) 200-5 MCG/ACT AERO Inhale 2 puffs into the lungs 2 (two) times daily.  . montelukast (SINGULAIR) 10 MG tablet Take 1 tablet (10 mg total) by mouth at bedtime.  . ondansetron (ZOFRAN ODT) 4 MG disintegrating tablet Take 1 tablet (4 mg total) by mouth every 8 (eight) hours as needed for nausea or vomiting.  . roflumilast (DALIRESP) 500 MCG TABS tablet Take 1 tablet (500 mcg total) by mouth daily.  Marland Kitchen Spacer/Aero-Holding Chambers (AEROCHAMBER PLUS FLO-VU) MISC USE AS DIRECTED  . SPIRIVA RESPIMAT 2.5 MCG/ACT AERS INHALE TWO SPRAY(S) BY MOUTH ONCE DAILY  . Azelastine-Fluticasone (DYMISTA) 137-50 MCG/ACT SUSP Place 2 Squirts into the nose daily. In each nostril (Patient not taking: Reported on 12/16/2014)  . [DISCONTINUED] chlorpheniramine-HYDROcodone (TUSSIONEX PENNKINETIC ER) 10-8 MG/5ML SUER Take 5 mLs by mouth at bedtime as needed for cough.  . [DISCONTINUED] predniSONE (DELTASONE) 10 MG tablet Take 2 tabs qd X5 days, then 1 tab qd X5 days, then stop.  . [DISCONTINUED] Red Yeast Rice 600 MG CAPS Take by mouth 2 (two) times daily.   . [DISCONTINUED] traZODone (DESYREL) 50 MG tablet Take 50-100 mg by mouth at bedtime.   No facility-administered encounter medications on file as of 12/16/2014.

## 2014-12-16 NOTE — Patient Instructions (Signed)
Stop smoking altogether. Cont current medications as prescribed. We will consider changing your maintenance medications entirely to nebulized form at the next visit if you are still having difficulty. Follow up in 4 wks. Call sooner if needed

## 2014-12-17 NOTE — Telephone Encounter (Signed)
Patient is requesting a new nebulizer from [employer].  rx printed.

## 2014-12-24 ENCOUNTER — Encounter: Payer: Self-pay | Admitting: Internal Medicine

## 2014-12-25 ENCOUNTER — Encounter: Payer: Self-pay | Admitting: Internal Medicine

## 2014-12-25 ENCOUNTER — Encounter: Payer: Self-pay | Admitting: *Deleted

## 2014-12-25 ENCOUNTER — Emergency Department: Payer: Managed Care, Other (non HMO)

## 2014-12-25 ENCOUNTER — Inpatient Hospital Stay
Admission: EM | Admit: 2014-12-25 | Discharge: 2015-01-02 | DRG: 207 | Disposition: A | Discharge status: Other Acute Inpatient Hospital | Payer: Managed Care, Other (non HMO) | Attending: Internal Medicine | Admitting: Internal Medicine

## 2014-12-25 ENCOUNTER — Other Ambulatory Visit: Payer: Self-pay

## 2014-12-25 DIAGNOSIS — E46 Unspecified protein-calorie malnutrition: Secondary | ICD-10-CM | POA: Diagnosis present

## 2014-12-25 DIAGNOSIS — E785 Hyperlipidemia, unspecified: Secondary | ICD-10-CM | POA: Diagnosis present

## 2014-12-25 DIAGNOSIS — F419 Anxiety disorder, unspecified: Secondary | ICD-10-CM | POA: Diagnosis present

## 2014-12-25 DIAGNOSIS — J9690 Respiratory failure, unspecified, unspecified whether with hypoxia or hypercapnia: Secondary | ICD-10-CM

## 2014-12-25 DIAGNOSIS — J9601 Acute respiratory failure with hypoxia: Secondary | ICD-10-CM | POA: Diagnosis not present

## 2014-12-25 DIAGNOSIS — Z880 Allergy status to penicillin: Secondary | ICD-10-CM

## 2014-12-25 DIAGNOSIS — T8182XD Emphysema (subcutaneous) resulting from a procedure, subsequent encounter: Secondary | ICD-10-CM | POA: Diagnosis not present

## 2014-12-25 DIAGNOSIS — Z6823 Body mass index (BMI) 23.0-23.9, adult: Secondary | ICD-10-CM | POA: Diagnosis not present

## 2014-12-25 DIAGNOSIS — I10 Essential (primary) hypertension: Secondary | ICD-10-CM | POA: Diagnosis present

## 2014-12-25 DIAGNOSIS — Z4659 Encounter for fitting and adjustment of other gastrointestinal appliance and device: Secondary | ICD-10-CM

## 2014-12-25 DIAGNOSIS — Z9981 Dependence on supplemental oxygen: Secondary | ICD-10-CM

## 2014-12-25 DIAGNOSIS — Z823 Family history of stroke: Secondary | ICD-10-CM | POA: Diagnosis not present

## 2014-12-25 DIAGNOSIS — J9621 Acute and chronic respiratory failure with hypoxia: Secondary | ICD-10-CM | POA: Diagnosis not present

## 2014-12-25 DIAGNOSIS — J9801 Acute bronchospasm: Secondary | ICD-10-CM | POA: Diagnosis not present

## 2014-12-25 DIAGNOSIS — Z79899 Other long term (current) drug therapy: Secondary | ICD-10-CM

## 2014-12-25 DIAGNOSIS — G4700 Insomnia, unspecified: Secondary | ICD-10-CM | POA: Diagnosis not present

## 2014-12-25 DIAGNOSIS — F17210 Nicotine dependence, cigarettes, uncomplicated: Secondary | ICD-10-CM | POA: Diagnosis present

## 2014-12-25 DIAGNOSIS — Z886 Allergy status to analgesic agent status: Secondary | ICD-10-CM

## 2014-12-25 DIAGNOSIS — J9620 Acute and chronic respiratory failure, unspecified whether with hypoxia or hypercapnia: Secondary | ICD-10-CM | POA: Diagnosis not present

## 2014-12-25 DIAGNOSIS — J441 Chronic obstructive pulmonary disease with (acute) exacerbation: Secondary | ICD-10-CM | POA: Diagnosis not present

## 2014-12-25 DIAGNOSIS — E871 Hypo-osmolality and hyponatremia: Secondary | ICD-10-CM | POA: Diagnosis not present

## 2014-12-25 DIAGNOSIS — F329 Major depressive disorder, single episode, unspecified: Secondary | ICD-10-CM | POA: Diagnosis present

## 2014-12-25 DIAGNOSIS — R000 Tachycardia, unspecified: Secondary | ICD-10-CM | POA: Diagnosis not present

## 2014-12-25 DIAGNOSIS — J9622 Acute and chronic respiratory failure with hypercapnia: Secondary | ICD-10-CM | POA: Diagnosis not present

## 2014-12-25 DIAGNOSIS — J189 Pneumonia, unspecified organism: Secondary | ICD-10-CM | POA: Diagnosis not present

## 2014-12-25 DIAGNOSIS — J449 Chronic obstructive pulmonary disease, unspecified: Secondary | ICD-10-CM | POA: Diagnosis present

## 2014-12-25 DIAGNOSIS — J383 Other diseases of vocal cords: Secondary | ICD-10-CM | POA: Diagnosis present

## 2014-12-25 DIAGNOSIS — J9600 Acute respiratory failure, unspecified whether with hypoxia or hypercapnia: Secondary | ICD-10-CM | POA: Diagnosis not present

## 2014-12-25 DIAGNOSIS — Z978 Presence of other specified devices: Secondary | ICD-10-CM

## 2014-12-25 DIAGNOSIS — J439 Emphysema, unspecified: Secondary | ICD-10-CM | POA: Diagnosis not present

## 2014-12-25 DIAGNOSIS — Z8249 Family history of ischemic heart disease and other diseases of the circulatory system: Secondary | ICD-10-CM

## 2014-12-25 DIAGNOSIS — I959 Hypotension, unspecified: Secondary | ICD-10-CM | POA: Diagnosis not present

## 2014-12-25 DIAGNOSIS — N289 Disorder of kidney and ureter, unspecified: Secondary | ICD-10-CM | POA: Diagnosis not present

## 2014-12-25 DIAGNOSIS — Z9911 Dependence on respirator [ventilator] status: Secondary | ICD-10-CM

## 2014-12-25 LAB — BASIC METABOLIC PANEL
ANION GAP: 10 (ref 5–15)
BUN: <5 mg/dL — ABNORMAL LOW (ref 6–20)
CHLORIDE: 100 mmol/L — AB (ref 101–111)
CO2: 27 mmol/L (ref 22–32)
Calcium: 9.9 mg/dL (ref 8.9–10.3)
Creatinine, Ser: 0.71 mg/dL (ref 0.44–1.00)
GFR calc non Af Amer: >60 mL/min (ref >60)
Glucose, Bld: 111 mg/dL — ABNORMAL HIGH (ref 65–99)
POTASSIUM: 4.3 mmol/L (ref 3.5–5.1)
SODIUM: 137 mmol/L (ref 135–145)

## 2014-12-25 LAB — BLOOD GAS, ARTERIAL
ACID-BASE DEFICIT: 0.4 mmol/L (ref 0.0–2.0)
BICARBONATE: 25.9 meq/L (ref 21.0–28.0)
FIO2: 0.32
O2 Saturation: 93.5 %
PCO2 ART: 48 mmHg (ref 32.0–48.0)
PH ART: 7.34 — AB (ref 7.350–7.450)
Patient temperature: 37
pO2, Arterial: 73 mmHg — ABNORMAL LOW (ref 83.0–108.0)

## 2014-12-25 LAB — CBC WITH DIFFERENTIAL/PLATELET
Basophils Absolute: 0 10*3/uL (ref 0–0.1)
Basophils Relative: 0 %
EOS ABS: 0.4 10*3/uL (ref 0–0.7)
Eosinophils Relative: 6 %
HEMATOCRIT: 42.6 % (ref 35.0–47.0)
HEMOGLOBIN: 14.2 g/dL (ref 12.0–16.0)
LYMPHS ABS: 1.2 10*3/uL (ref 1.0–3.6)
Lymphocytes Relative: 15 %
MCH: 30 pg (ref 26.0–34.0)
MCHC: 33.2 g/dL (ref 32.0–36.0)
MCV: 90.2 fL (ref 80.0–100.0)
MONOS PCT: 8 %
Monocytes Absolute: 0.7 10*3/uL (ref 0.2–0.9)
NEUTROS ABS: 5.7 10*3/uL (ref 1.4–6.5)
NEUTROS PCT: 71 %
Platelets: 274 10*3/uL (ref 150–440)
RBC: 4.73 MIL/uL (ref 3.80–5.20)
RDW: 13.7 % (ref 11.5–14.5)
WBC: 7.9 10*3/uL (ref 3.6–11.0)

## 2014-12-25 MED ORDER — MAGNESIUM SULFATE 2 GM/50ML IV SOLN
2.0000 g | Freq: Once | INTRAVENOUS | Status: AC
  Administered 2014-12-25: 2 g via INTRAVENOUS
  Filled 2014-12-25: qty 50

## 2014-12-25 MED ORDER — NICOTINE 14 MG/24HR TD PT24
14.0000 mg | MEDICATED_PATCH | Freq: Every day | TRANSDERMAL | Status: DC
  Filled 2014-12-25 (×3): qty 1

## 2014-12-25 MED ORDER — ONDANSETRON 4 MG PO TBDP: 4.0000 mg | ORAL_TABLET | Freq: Three times a day (TID) | ORAL | Status: DC | PRN

## 2014-12-25 MED ORDER — LORAZEPAM 1 MG PO TABS
1.0000 mg | ORAL_TABLET | Freq: Once | ORAL | Status: AC
  Administered 2014-12-25: 1 mg via ORAL

## 2014-12-25 MED ORDER — SODIUM CHLORIDE 0.9 % IJ SOLN
3.0000 mL | Freq: Two times a day (BID) | INTRAMUSCULAR | Status: DC
  Administered 2014-12-25 – 2015-01-02 (×14): 3 mL via INTRAVENOUS

## 2014-12-25 MED ORDER — GUAIFENESIN-CODEINE 100-6.3 MG/5ML PO SOLN: 5.0000 mL | Freq: Four times a day (QID) | ORAL | Status: DC | PRN

## 2014-12-25 MED ORDER — CLONAZEPAM 0.5 MG PO TABS
0.5000 mg | ORAL_TABLET | Freq: Two times a day (BID) | ORAL | Status: DC | PRN
  Administered 2014-12-25 – 2014-12-27 (×4): 0.5 mg via ORAL
  Filled 2014-12-25 (×4): qty 1

## 2014-12-25 MED ORDER — LORATADINE 10 MG PO TABS
10.0000 mg | ORAL_TABLET | Freq: Every day | ORAL | Status: DC
  Administered 2014-12-25 – 2014-12-27 (×3): 10 mg via ORAL
  Filled 2014-12-25 (×3): qty 1

## 2014-12-25 MED ORDER — MELATONIN 5 MG PO TABS: 5.0000 mg | ORAL_TABLET | Freq: Every day | ORAL | Status: DC

## 2014-12-25 MED ORDER — MOMETASONE FURO-FORMOTEROL FUM 200-5 MCG/ACT IN AERO
2.0000 | INHALATION_SPRAY | Freq: Two times a day (BID) | RESPIRATORY_TRACT | Status: DC
  Administered 2014-12-25 – 2014-12-27 (×4): 2 via RESPIRATORY_TRACT
  Filled 2014-12-25: qty 8.8

## 2014-12-25 MED ORDER — IPRATROPIUM-ALBUTEROL 0.5-2.5 (3) MG/3ML IN SOLN
3.0000 mL | RESPIRATORY_TRACT | Status: AC
  Administered 2014-12-25 (×3): 3 mL via RESPIRATORY_TRACT
  Filled 2014-12-25: qty 3
  Filled 2014-12-25: qty 6

## 2014-12-25 MED ORDER — ONDANSETRON HCL 4 MG PO TABS: 4.0000 mg | ORAL_TABLET | Freq: Four times a day (QID) | ORAL | Status: DC | PRN

## 2014-12-25 MED ORDER — MONTELUKAST SODIUM 10 MG PO TABS
10.0000 mg | ORAL_TABLET | Freq: Every day | ORAL | Status: DC
  Administered 2014-12-25 – 2014-12-26 (×2): 10 mg via ORAL
  Filled 2014-12-25 (×3): qty 1

## 2014-12-25 MED ORDER — CITALOPRAM HYDROBROMIDE 20 MG PO TABS
40.0000 mg | ORAL_TABLET | Freq: Every day | ORAL | Status: DC
  Administered 2014-12-25 – 2014-12-28 (×4): 40 mg via ORAL
  Filled 2014-12-25 (×4): qty 2

## 2014-12-25 MED ORDER — ONDANSETRON HCL 4 MG/2ML IJ SOLN
4.0000 mg | Freq: Four times a day (QID) | INTRAMUSCULAR | Status: DC | PRN
  Administered 2014-12-25 – 2014-12-27 (×5): 4 mg via INTRAVENOUS
  Filled 2014-12-25 (×5): qty 2

## 2014-12-25 MED ORDER — TIOTROPIUM BROMIDE MONOHYDRATE 18 MCG IN CAPS
1.0000 | ORAL_CAPSULE | Freq: Every day | RESPIRATORY_TRACT | Status: DC
  Administered 2014-12-25 – 2014-12-26 (×2): 18 ug via RESPIRATORY_TRACT
  Filled 2014-12-25: qty 5

## 2014-12-25 MED ORDER — ACETAMINOPHEN 650 MG RE SUPP: 650.0000 mg | Freq: Four times a day (QID) | RECTAL | Status: DC | PRN

## 2014-12-25 MED ORDER — AZITHROMYCIN 250 MG PO TABS: ORAL_TABLET | ORAL | Status: DC

## 2014-12-25 MED ORDER — LOSARTAN POTASSIUM 50 MG PO TABS
50.0000 mg | ORAL_TABLET | Freq: Two times a day (BID) | ORAL | Status: DC
  Administered 2014-12-25 – 2014-12-27 (×4): 50 mg via ORAL
  Filled 2014-12-25 (×4): qty 1

## 2014-12-25 MED ORDER — SENNOSIDES-DOCUSATE SODIUM 8.6-50 MG PO TABS: 1.0000 | ORAL_TABLET | Freq: Every evening | ORAL | Status: DC | PRN

## 2014-12-25 MED ORDER — ACETAMINOPHEN 500 MG PO TABS
1000.0000 mg | ORAL_TABLET | Freq: Every evening | ORAL | Status: DC | PRN
  Filled 2014-12-25: qty 2

## 2014-12-25 MED ORDER — ALBUTEROL SULFATE (2.5 MG/3ML) 0.083% IN NEBU
5.0000 mg | INHALATION_SOLUTION | Freq: Once | RESPIRATORY_TRACT | Status: DC
Start: 2014-12-25 — End: 2014-12-25

## 2014-12-25 MED ORDER — DIPHENHYDRAMINE HCL 25 MG PO CAPS
50.0000 mg | ORAL_CAPSULE | Freq: Every evening | ORAL | Status: DC | PRN
  Administered 2014-12-25: 50 mg via ORAL
  Filled 2014-12-25: qty 2

## 2014-12-25 MED ORDER — BENZONATATE 100 MG PO CAPS: 200.0000 mg | ORAL_CAPSULE | Freq: Three times a day (TID) | ORAL | Status: DC | PRN

## 2014-12-25 MED ORDER — DEXTROSE 5 % IV SOLN
500.0000 mg | INTRAVENOUS | Status: DC
  Administered 2014-12-25: 500 mg via INTRAVENOUS
  Filled 2014-12-25 (×2): qty 500

## 2014-12-25 MED ORDER — METHYLPREDNISOLONE SODIUM SUCC 125 MG IJ SOLR
125.0000 mg | Freq: Once | INTRAMUSCULAR | Status: AC
  Administered 2014-12-25: 125 mg via INTRAVENOUS
  Filled 2014-12-25: qty 2

## 2014-12-25 MED ORDER — HYDROCOD POLST-CPM POLST ER 10-8 MG/5ML PO SUER
5.0000 mL | Freq: Two times a day (BID) | ORAL | Status: DC
  Administered 2014-12-25 – 2014-12-27 (×4): 5 mL via ORAL
  Filled 2014-12-25 (×4): qty 5

## 2014-12-25 MED ORDER — ROFLUMILAST 500 MCG PO TABS
500.0000 ug | ORAL_TABLET | Freq: Every day | ORAL | Status: DC
  Administered 2014-12-25 – 2014-12-27 (×3): 500 ug via ORAL
  Filled 2014-12-25 (×3): qty 1

## 2014-12-25 MED ORDER — BACID PO TABS
1.0000 | ORAL_TABLET | Freq: Every day | ORAL | Status: DC
  Administered 2014-12-25 – 2014-12-28 (×4): 1 via ORAL
  Filled 2014-12-25 (×6): qty 1

## 2014-12-25 MED ORDER — PREDNISONE 20 MG PO TABS: 40.0000 mg | ORAL_TABLET | Freq: Every day | ORAL | Status: DC

## 2014-12-25 MED ORDER — DIPHENHYDRAMINE-APAP (SLEEP) 25-500 MG PO TABS: 2.0000 | ORAL_TABLET | Freq: Every evening | ORAL | Status: DC | PRN

## 2014-12-25 MED ORDER — ALUM & MAG HYDROXIDE-SIMETH 200-200-20 MG/5ML PO SUSP: 30.0000 mL | Freq: Four times a day (QID) | ORAL | Status: DC | PRN

## 2014-12-25 MED ORDER — ACETAMINOPHEN 325 MG PO TABS: 650.0000 mg | ORAL_TABLET | Freq: Four times a day (QID) | ORAL | Status: DC | PRN

## 2014-12-25 MED ORDER — LORAZEPAM 1 MG PO TABS
ORAL_TABLET | ORAL | Status: AC
  Administered 2014-12-25: 1 mg via ORAL
  Filled 2014-12-25: qty 1

## 2014-12-25 MED ORDER — IPRATROPIUM-ALBUTEROL 0.5-2.5 (3) MG/3ML IN SOLN
3.0000 mL | RESPIRATORY_TRACT | Status: DC
  Administered 2014-12-25 – 2014-12-26 (×5): 3 mL via RESPIRATORY_TRACT
  Filled 2014-12-25 (×6): qty 3

## 2014-12-25 MED ORDER — MORPHINE SULFATE (PF) 4 MG/ML IV SOLN
4.0000 mg | Freq: Once | INTRAVENOUS | Status: AC
  Administered 2014-12-25: 4 mg via INTRAVENOUS
  Filled 2014-12-25: qty 1

## 2014-12-25 MED ORDER — IPRATROPIUM-ALBUTEROL 0.5-2.5 (3) MG/3ML IN SOLN
9.0000 mL | Freq: Once | RESPIRATORY_TRACT | Status: AC
  Administered 2014-12-25: 9 mL via RESPIRATORY_TRACT
  Filled 2014-12-25: qty 9

## 2014-12-25 MED ORDER — ENOXAPARIN SODIUM 40 MG/0.4ML ~~LOC~~ SOLN
40.0000 mg | SUBCUTANEOUS | Status: DC
  Administered 2014-12-25 – 2014-12-26 (×2): 40 mg via SUBCUTANEOUS
  Filled 2014-12-25 (×2): qty 0.4

## 2014-12-25 MED ORDER — METHYLPREDNISOLONE SODIUM SUCC 125 MG IJ SOLR
60.0000 mg | Freq: Three times a day (TID) | INTRAMUSCULAR | Status: DC
  Administered 2014-12-25 – 2014-12-27 (×7): 60 mg via INTRAVENOUS
  Filled 2014-12-25 (×7): qty 2

## 2014-12-25 NOTE — ED Notes (Signed)
Pt has COPD , reports having increased dyspnea since Sunday increasing today, pt using accessory muscles to breathe
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
## 2014-12-25 NOTE — Discharge Instructions (Signed)
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
## 2014-12-26 LAB — CBC
HCT: 38.6 % (ref 35.0–47.0)
HEMOGLOBIN: 13.2 g/dL (ref 12.0–16.0)
MCH: 30.8 pg (ref 26.0–34.0)
MCHC: 34.3 g/dL (ref 32.0–36.0)
MCV: 89.7 fL (ref 80.0–100.0)
Platelets: 239 10*3/uL (ref 150–440)
RBC: 4.3 MIL/uL (ref 3.80–5.20)
RDW: 13.6 % (ref 11.5–14.5)
WBC: 4.9 10*3/uL (ref 3.6–11.0)

## 2014-12-26 LAB — BASIC METABOLIC PANEL
ANION GAP: 10 (ref 5–15)
BUN: 8 mg/dL (ref 6–20)
CALCIUM: 9.3 mg/dL (ref 8.9–10.3)
CO2: 27 mmol/L (ref 22–32)
Chloride: 95 mmol/L — ABNORMAL LOW (ref 101–111)
Creatinine, Ser: 0.63 mg/dL (ref 0.44–1.00)
GFR calc Af Amer: >60 mL/min (ref >60)
GFR calc non Af Amer: >60 mL/min (ref >60)
GLUCOSE: 136 mg/dL — AB (ref 65–99)
Potassium: 4.2 mmol/L (ref 3.5–5.1)
Sodium: 132 mmol/L — ABNORMAL LOW (ref 135–145)

## 2014-12-26 MED ORDER — ZOLPIDEM TARTRATE 5 MG PO TABS
5.0000 mg | ORAL_TABLET | Freq: Every evening | ORAL | Status: DC | PRN
Start: 2014-12-26 — End: 2014-12-29
  Administered 2014-12-26: 5 mg via ORAL
  Filled 2014-12-26: qty 1

## 2014-12-26 MED ORDER — DOXYCYCLINE HYCLATE 100 MG PO TABS
100.0000 mg | ORAL_TABLET | Freq: Two times a day (BID) | ORAL | Status: DC
  Administered 2014-12-26 – 2014-12-27 (×3): 100 mg via ORAL
  Filled 2014-12-26 (×3): qty 1

## 2014-12-26 MED ORDER — ALBUTEROL SULFATE (2.5 MG/3ML) 0.083% IN NEBU
2.5000 mg | INHALATION_SOLUTION | RESPIRATORY_TRACT | Status: DC
  Administered 2014-12-26 – 2014-12-27 (×6): 2.5 mg via RESPIRATORY_TRACT
  Filled 2014-12-26 (×8): qty 3

## 2014-12-26 NOTE — Progress Notes (Signed)
[facility] - [place] at [facility]   PATIENT NAME: Diane [name]    MR#:  [id number]  DATE OF BIRTH:  Oct 25, 1955  SUBJECTIVE:  CHIEF COMPLAINT:   Chief Complaint  Patient presents with  . Respiratory Distress  Really struggling to breathe, wheezing, chest tightness, tachycardic  REVIEW OF SYSTEMS:  Review of Systems  Constitutional: Negative for fever, weight loss, malaise/fatigue and diaphoresis.  HENT: Negative for ear discharge, ear pain, hearing loss, nosebleeds, sore throat and tinnitus.   Eyes: Negative for blurred vision and pain.  Respiratory: Positive for cough, shortness of breath and wheezing. Negative for hemoptysis.   Cardiovascular: Negative for chest pain, palpitations, orthopnea and leg swelling.  Gastrointestinal: Negative for heartburn, nausea, vomiting, abdominal pain, diarrhea, constipation and blood in stool.  Genitourinary: Negative for dysuria, urgency and frequency.  Musculoskeletal: Negative for myalgias and back pain.  Skin: Negative for itching and rash.  Neurological: Negative for dizziness, tingling, tremors, focal weakness, seizures, weakness and headaches.  Psychiatric/Behavioral: Negative for depression. The patient is not nervous/anxious.    DRUG ALLERGIES:   Allergies  Allergen Reactions  . Aspirin Swelling  . Levofloxacin Nausea Only  . Penicillins Hives   VITALS:  Blood pressure 188/91, pulse 113, temperature 97.9 F (36.6 C), temperature source Oral, resp. rate 18, height 5\' 7"  (1.702 m), weight 66.679 kg (147 lb), SpO2 92 %. PHYSICAL EXAMINATION:  Physical Exam  Constitutional: She is oriented to person, place, and time and well-developed, well-nourished, and in no distress.  HENT:  Head: Normocephalic and atraumatic.  Eyes: Conjunctivae and EOM are normal. Pupils are equal, round, and reactive to light.  Neck: Normal range of motion. Neck supple. No tracheal deviation present. No thyromegaly present.   Cardiovascular: Normal rate, regular rhythm and normal heart sounds.   Pulmonary/Chest: Accessory muscle usage present. She is in respiratory distress. She has decreased breath sounds. She has wheezes. She has rhonchi. She exhibits no tenderness.  Abdominal: Soft. Bowel sounds are normal. She exhibits no distension. There is no tenderness.  Musculoskeletal: Normal range of motion.  Neurological: She is alert and oriented to person, place, and time. No cranial nerve deficit.  Skin: Skin is warm and dry. No rash noted.  Psychiatric: Mood and affect normal.   LABORATORY PANEL:   CBC  Recent Labs Lab 12/26/14 0502  WBC 4.9  HGB 13.2  HCT 38.6  PLT 239   ------------------------------------------------------------------------------------------------------------------ Chemistries   Recent Labs Lab 12/26/14 0502  NA 132*  K 4.2  CL 95*  CO2 27  GLUCOSE 136*  BUN 8  CREATININE 0.63  CALCIUM 9.3   RADIOLOGY:  Dg Chest 2 View  12/25/2014   CLINICAL DATA:  Productive cough, severe shortness of breath for 4 days.  EXAM: CHEST  2 VIEW  COMPARISON:  08/22/2014  FINDINGS: Hyperinflation of the lungs. Heart and mediastinal contours are within normal limits. No focal opacities or effusions. No acute bony abnormality.  IMPRESSION: Hyperinflation.  No active disease.   Electronically Signed   By: Charlett Nose M.D.   On: 12/25/2014 13:18   ASSESSMENT AND PLAN:  Merikay Lesniewski is a 59 y.o. female with a known history of COPD on 2 L home oxygen, hypertension depression and anxiety presents to the hospital secondary to worsening difficulty breathing.  #1 Acute on chronic Respiratory Failure due to COPD exacerbation - Continue IV steroids, continue oxygen support. -Duonebs, every 4 hours. -Continue home inhalers-on Dulera -Continue Daliresp and Singulair -Cough medicines. Switch azithromycin to  PO Doxy - intravenous Solu-Medrol - We will consult pulmonary  #2 Hypertension-continue  Cozaar  #3 depression and anxiety-on Celexa and Klonopin.  #4  Insomnia: We will try Ambien, Benadryl did not work well last night  DVT prophylaxis-on Lovenox.    All the records are reviewed and case discussed with Care Management/Social Worker. Management plans discussed with the patient, family and they are in agreement.  CODE STATUS: Full code  She is at high risk for cardio-respi failure and death.  TOTAL TIME (Critical Care) TAKING CARE OF THIS PATIENT: 35 minutes.   More than 50% of the time was spent in counseling/coordination of care: YES  POSSIBLE D/C IN 1-2 DAYS, DEPENDING ON CLINICAL CONDITION and pulmonary evaluation   [facility], Alauna Hayden M.D on 12/26/2014 at 11:46 AM  Between 7am to 6pm - Pager - [phone]  After 6pm go to [email or web address] - password [email or web address] [facility]  [facility]  Office  [phone]  CC:  Primary care physician; Sherlene Shams, MD
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
## 2014-12-27 DIAGNOSIS — F419 Anxiety disorder, unspecified: Secondary | ICD-10-CM

## 2014-12-27 DIAGNOSIS — J9620 Acute and chronic respiratory failure, unspecified whether with hypoxia or hypercapnia: Secondary | ICD-10-CM

## 2014-12-27 DIAGNOSIS — J441 Chronic obstructive pulmonary disease with (acute) exacerbation: Secondary | ICD-10-CM

## 2014-12-27 DIAGNOSIS — J383 Other diseases of vocal cords: Secondary | ICD-10-CM

## 2014-12-27 LAB — MRSA PCR SCREENING: MRSA BY PCR: NEGATIVE

## 2014-12-27 MED ORDER — METHYLPREDNISOLONE SODIUM SUCC 125 MG IJ SOLR
60.0000 mg | Freq: Two times a day (BID) | INTRAMUSCULAR | Status: DC
  Administered 2014-12-28 – 2014-12-29 (×3): 60 mg via INTRAVENOUS
  Filled 2014-12-27 (×3): qty 2

## 2014-12-27 MED ORDER — HYDRALAZINE HCL 20 MG/ML IJ SOLN
10.0000 mg | INTRAMUSCULAR | Status: DC | PRN
  Administered 2014-12-27: 10 mg via INTRAVENOUS
  Administered 2014-12-28 – 2014-12-29 (×3): 20 mg via INTRAVENOUS
  Filled 2014-12-27: qty 2
  Filled 2014-12-27: qty 1
  Filled 2014-12-27: qty 2
  Filled 2014-12-27: qty 1

## 2014-12-27 MED ORDER — DEXTROSE IN LACTATED RINGERS 5 % IV SOLN
INTRAVENOUS | Status: DC
  Administered 2014-12-27 – 2014-12-28 (×2): via INTRAVENOUS

## 2014-12-27 MED ORDER — IPRATROPIUM BROMIDE 0.02 % IN SOLN
0.5000 mg | Freq: Four times a day (QID) | RESPIRATORY_TRACT | Status: DC
Start: 2014-12-27 — End: 2014-12-30
  Administered 2014-12-27 – 2014-12-30 (×12): 0.5 mg via RESPIRATORY_TRACT
  Filled 2014-12-27 (×12): qty 2.5

## 2014-12-27 MED ORDER — LEVALBUTEROL HCL 1.25 MG/0.5ML IN NEBU
1.2500 mg | INHALATION_SOLUTION | Freq: Four times a day (QID) | RESPIRATORY_TRACT | Status: DC
  Administered 2014-12-27 – 2014-12-29 (×7): 1.25 mg via RESPIRATORY_TRACT
  Filled 2014-12-27 (×7): qty 0.5

## 2014-12-27 MED ORDER — LORAZEPAM 2 MG/ML IJ SOLN
0.5000 mg | INTRAMUSCULAR | Status: DC | PRN
  Administered 2014-12-27 – 2015-01-02 (×11): 1 mg via INTRAVENOUS
  Filled 2014-12-27 (×11): qty 1

## 2014-12-27 MED ORDER — LORAZEPAM 2 MG/ML IJ SOLN
1.0000 mg | Freq: Once | INTRAMUSCULAR | Status: AC
  Administered 2014-12-27: 1 mg via INTRAVENOUS

## 2014-12-27 MED ORDER — CLONAZEPAM 0.5 MG PO TABS
0.5000 mg | ORAL_TABLET | Freq: Two times a day (BID) | ORAL | Status: DC
  Administered 2014-12-27 – 2014-12-28 (×3): 0.5 mg via ORAL
  Filled 2014-12-27 (×3): qty 1

## 2014-12-27 MED ORDER — METHYLPREDNISOLONE SODIUM SUCC 125 MG IJ SOLR
INTRAMUSCULAR | Status: AC
  Administered 2014-12-27: 60 mg
  Filled 2014-12-27: qty 2

## 2014-12-27 MED ORDER — BUDESONIDE 0.25 MG/2ML IN SUSP
0.2500 mg | Freq: Four times a day (QID) | RESPIRATORY_TRACT | Status: DC
  Administered 2014-12-27: 0.25 mg via RESPIRATORY_TRACT
  Filled 2014-12-27: qty 2

## 2014-12-27 MED ORDER — BUDESONIDE 0.25 MG/2ML IN SUSP
0.2500 mg | Freq: Four times a day (QID) | RESPIRATORY_TRACT | Status: DC
  Administered 2014-12-27 – 2014-12-30 (×11): 0.25 mg via RESPIRATORY_TRACT
  Filled 2014-12-27 (×11): qty 2

## 2014-12-27 MED ORDER — DEXTROSE 5 % IV SOLN
100.0000 mg | Freq: Two times a day (BID) | INTRAVENOUS | Status: DC
  Administered 2014-12-27 – 2014-12-31 (×9): 100 mg via INTRAVENOUS
  Filled 2014-12-27 (×12): qty 100

## 2014-12-27 MED ORDER — METHYLPREDNISOLONE SODIUM SUCC 40 MG IJ SOLR: 40.0000 mg | Freq: Two times a day (BID) | INTRAMUSCULAR | Status: DC

## 2014-12-27 MED ORDER — LEVALBUTEROL HCL 0.63 MG/3ML IN NEBU
0.6300 mg | INHALATION_SOLUTION | RESPIRATORY_TRACT | Status: DC | PRN
  Administered 2014-12-27 – 2014-12-30 (×2): 0.63 mg via RESPIRATORY_TRACT
  Filled 2014-12-27 (×2): qty 3

## 2014-12-27 MED ORDER — METOPROLOL TARTRATE 1 MG/ML IV SOLN
2.5000 mg | INTRAVENOUS | Status: DC | PRN
  Administered 2014-12-27: 2.5 mg via INTRAVENOUS
  Administered 2014-12-27 – 2014-12-28 (×2): 5 mg via INTRAVENOUS
  Filled 2014-12-27 (×3): qty 5

## 2014-12-27 MED ORDER — LEVALBUTEROL HCL 1.25 MG/0.5ML IN NEBU
1.2500 mg | INHALATION_SOLUTION | Freq: Four times a day (QID) | RESPIRATORY_TRACT | Status: DC
  Administered 2014-12-27: 1.25 mg via RESPIRATORY_TRACT
  Filled 2014-12-27: qty 0.5

## 2014-12-27 NOTE — Progress Notes (Signed)
[facility] [place] at [facility]   PATIENT NAME: Diane [name]    MR#:  [id number]  DATE OF BIRTH:  Oct 05, 1955  SUBJECTIVE:  CHIEF COMPLAINT:   Chief Complaint  Patient presents with  . Respiratory Distress  Really struggling to breathe, wheezing, chest tightness, tachycardic, tachypneic also. Husband at bedside REVIEW OF SYSTEMS:  Review of Systems  Constitutional: Negative for fever, weight loss, malaise/fatigue and diaphoresis.  HENT: Negative for ear discharge, ear pain, hearing loss, nosebleeds, sore throat and tinnitus.   Eyes: Negative for blurred vision and pain.  Respiratory: Positive for cough, shortness of breath and wheezing. Negative for hemoptysis.   Cardiovascular: Negative for chest pain, palpitations, orthopnea and leg swelling.  Gastrointestinal: Negative for heartburn, nausea, vomiting, abdominal pain, diarrhea, constipation and blood in stool.  Genitourinary: Negative for dysuria, urgency and frequency.  Musculoskeletal: Negative for myalgias and back pain.  Skin: Negative for itching and rash.  Neurological: Negative for dizziness, tingling, tremors, focal weakness, seizures, weakness and headaches.  Psychiatric/Behavioral: Negative for depression. The patient is not nervous/anxious.    DRUG ALLERGIES:   Allergies  Allergen Reactions  . Aspirin Swelling  . Levofloxacin Nausea Only  . Penicillins Hives   VITALS:  Blood pressure 201/101, pulse 109, temperature 97.9 F (36.6 C), temperature source Oral, resp. rate 24, height  (1.702 m), weight 66.679 kg (147 lb), SpO2 93 %. PHYSICAL EXAMINATION:  Physical Exam  Constitutional: She is oriented to person, place, and time and well-developed, well-nourished, and in no distress.  HENT:  Head: Normocephalic and atraumatic.  Eyes: Conjunctivae and EOM are normal. Pupils are equal, round, and reactive to light.  Neck: Normal range of motion. Neck supple. No tracheal deviation  present. No thyromegaly present.  Cardiovascular: Normal rate, regular rhythm and normal heart sounds.   Pulmonary/Chest: Accessory muscle usage present. She is in respiratory distress. She has decreased breath sounds. She has wheezes. She has rhonchi. She exhibits no tenderness.  Abdominal: Soft. Bowel sounds are normal. She exhibits no distension. There is no tenderness.  Musculoskeletal: Normal range of motion.  Neurological: She is alert and oriented to person, place, and time. No cranial nerve deficit.  Skin: Skin is warm and dry. No rash noted.  Psychiatric: Mood and affect normal.   LABORATORY PANEL:   CBC  Recent Labs Lab 12/26/14 0502  WBC 4.9  HGB 13.2  HCT 38.6  PLT 239   ------------------------------------------------------------------------------------------------------------------ Chemistries   Recent Labs Lab 12/26/14 0502  NA 132*  K 4.2  CL 95*  CO2 27  GLUCOSE 136*  BUN 8  CREATININE 0.63  CALCIUM 9.3   RADIOLOGY:  No results found. ASSESSMENT AND PLAN:  Diane [name] is a 59 y.o. female with a known history of COPD on 2 L home oxygen, hypertension depression and anxiety presents to the hospital secondary to worsening difficulty breathing.  #1 Acute on chronic Respiratory Failure due to COPD exacerbation - Continue IV steroids, continue oxygen support. -Duonebs, every 4 hours. -Continue home inhalers-on Dulera -Continue Daliresp and Singulair -Cough medicines. Switch azithromycin to PO Doxy - intravenous Solu-Medrol - Apprciate pulmonary input - if not much improvement - may need to transfer to CCU  #2 Hypertension-continue Cozaar  #3 depression and anxiety-on Celexa and Klonopin.  #4  Insomnia: prn Ambien  DVT prophylaxis-on Lovenox.    All the records are reviewed and case discussed with Care Management/Social Worker. Management plans discussed with the patient, family and they are in agreement.  CODE STATUS: Full code  She is at  high risk for cardio-respi failure and death.  TOTAL TIME (Critical Care) TAKING CARE OF THIS PATIENT: 35 minutes.   More than 50% of the time was spent in counseling/coordination of care: YES  POSSIBLE D/C IN 2-3 DAYS, DEPENDING ON CLINICAL CONDITION and pulmonary evaluation   [facility], Bryony Kaman M.D on 12/27/2014 at 3:49 PM  Between 7am to 6pm - Pager - [phone]  After 6pm go to [email or web address] - password [email or web address] [facility]  [place] [facility]  Office  [phone]  CC:  Primary care physician; Sherlene Shams, MD

## 2014-12-27 NOTE — Significant Event (Signed)
Paged for Rapid Response. On arrival, patient sitting on side of bed. Severe respiratory distress. RRT present. High flow oxygen mask placed and breathing treatment being administered. RN present at bedside, ICU charge nurse present. Assessed by ICU charge nurse. Patient stated two times she did not want anything put down her throat. Patient prepared for transport to ICU with O2 for transport. Moved to ICU without episode. Dr. Sung Amabile to patient's bedside.

## 2014-12-27 NOTE — Progress Notes (Signed)
   12/27/14 1520  Clinical Encounter Type  Visited With Patient;Health care provider  Visit Type Code  Referral From Nurse  Consult/Referral To Chaplain  Spiritual Encounters  Spiritual Needs Emotional  Stress Factors  Patient Stress Factors Health changes  Chaplain provided support to patient and staff as the patient was being treated. Offered a compassionate presence. Chaplain Seraphine Gudiel A. Devin Foskey Ext. [phone]

## 2014-12-27 NOTE — Progress Notes (Signed)
At 2300 called dr hower pt up on side of be complains of nausea very tight and wheeze gave pt treatment also increased solu medrol to 60 and gave pt ativan back in bed bipap back on Darnisha Vernet M, [place]

## 2014-12-27 NOTE — Progress Notes (Signed)
Notified by Nurse that RRT called as her breathing gotten worse and having severe resp distress.  Will transfer to CCU STAT as high risk of needing intubation. D/w Dr Sung Amabile (CCM).

## 2014-12-27 NOTE — Progress Notes (Signed)
Staci, RN gave report to this RN at this time.  This RN now taking over patient's care.

## 2014-12-27 NOTE — Progress Notes (Signed)
Pt inadvertently given Spiriva cap orally by [facility] student.  Pharmacy, RN, MD, and patient notified, no harm to patient, SZP entered by instructor, patient was subsequently given her Spiriva inhalation correctly.

## 2014-12-27 NOTE — Progress Notes (Signed)
Pt transferred to ICU after rapid response call for severe resp distress  Upon arrival to ICU, pt in severe distress with increased WOB Overtly anxious, RASS +2 HEENT WNL No JVD + pseudowheeze BS diminished, scattered bronchial wheezes Tachy, regular, no M noted NABS, soft, NT Ext warm without edema No focal neuro deficits noted  Labs and CXR reviewed  IMPRESSION: Severe emphysema Severe acute on chronic respiratory failure AECOPD with acute bronchospasm Anxiety complicating resp distress Vocal cord dysfunction - not likely the primary problem but a complicating feature High risk of requiring inutbaiton  PLAN/REC: Agree with ICU level of care NPPV ordered - PRN Nebulized steroids added Bronchodilator regimen adjusted Systemic steroids dose adjusted Low dose PRN lorazepam ordered NPO until off BiPAP and risk of intubation subsides Maintenance IVFs ordered Husband updated in detail  30 mins CCM time  Billy Fischer, MD PCCM service Mobile [phone] Pager [phone]

## 2014-12-27 NOTE — Progress Notes (Signed)
New IV inserted per policy. Previous IV site removed per policy due to leaking. Pt began feeling anxious. RN sitting at bedside reassuring pt to take deep breaths. Pt sitting on side of bed and stating that she feels better. RN came back into room to check on pt and pt was having trouble breathing and stated she wanted breathing treatment. Respiratory immediately notified and administered treatment while staying with pt. Stridor and respiratory distress present at this time. BP 201/101 and heart rate 109. Rapid response called. Nursing supervisor and CCU charge nurse arrived at bedside. Dr. Sherryll Burger notified and ordered pt to be transferred to CCU stat. Belongings gathered and pt transferred to CCU momentarily. Pt transferred to CCU bed and put on bi-pap. CCU MD at bedside and attentive to pt. Report given to CCU charge nurse. Pt's husband craig notified and arrived at hospital.

## 2014-12-28 ENCOUNTER — Inpatient Hospital Stay: Payer: Managed Care, Other (non HMO)

## 2014-12-28 DIAGNOSIS — J439 Emphysema, unspecified: Secondary | ICD-10-CM

## 2014-12-28 LAB — CBC
HEMATOCRIT: 38.5 % (ref 35.0–47.0)
Hemoglobin: 12.8 g/dL (ref 12.0–16.0)
MCH: 29.9 pg (ref 26.0–34.0)
MCHC: 33.2 g/dL (ref 32.0–36.0)
MCV: 89.9 fL (ref 80.0–100.0)
Platelets: 307 10*3/uL (ref 150–440)
RBC: 4.29 MIL/uL (ref 3.80–5.20)
RDW: 13.7 % (ref 11.5–14.5)
WBC: 17.9 10*3/uL — AB (ref 3.6–11.0)

## 2014-12-28 LAB — COMPREHENSIVE METABOLIC PANEL
ALT: 27 U/L (ref 14–54)
AST: 37 U/L (ref 15–41)
Albumin: 4.6 g/dL (ref 3.5–5.0)
Alkaline Phosphatase: 38 U/L (ref 38–126)
Anion gap: 15 (ref 5–15)
BILIRUBIN TOTAL: 0.7 mg/dL (ref 0.3–1.2)
BUN: 35 mg/dL — AB (ref 6–20)
CO2: 25 mmol/L (ref 22–32)
CREATININE: 1.08 mg/dL — AB (ref 0.44–1.00)
Calcium: 9.6 mg/dL (ref 8.9–10.3)
Chloride: 93 mmol/L — ABNORMAL LOW (ref 101–111)
GFR calc Af Amer: >60 mL/min (ref >60)
GFR, EST NON AFRICAN AMERICAN: 55 mL/min — AB (ref >60)
Glucose, Bld: 139 mg/dL — ABNORMAL HIGH (ref 65–99)
POTASSIUM: 5.1 mmol/L (ref 3.5–5.1)
Sodium: 133 mmol/L — ABNORMAL LOW (ref 135–145)
TOTAL PROTEIN: 7.2 g/dL (ref 6.5–8.1)

## 2014-12-28 MED ORDER — IPRATROPIUM-ALBUTEROL 0.5-2.5 (3) MG/3ML IN SOLN
RESPIRATORY_TRACT | Status: AC
  Administered 2014-12-28: 03:00:00
  Filled 2014-12-28: qty 3

## 2014-12-28 MED ORDER — IPRATROPIUM-ALBUTEROL 0.5-2.5 (3) MG/3ML IN SOLN
3.0000 mL | RESPIRATORY_TRACT | Status: DC | PRN
  Administered 2014-12-28: 3 mL via RESPIRATORY_TRACT

## 2014-12-28 MED ORDER — MORPHINE SULFATE (PF) 2 MG/ML IV SOLN
INTRAVENOUS | Status: AC
  Administered 2014-12-28: 2 mg via INTRAVASCULAR
  Filled 2014-12-28: qty 1

## 2014-12-28 MED ORDER — CETYLPYRIDINIUM CHLORIDE 0.05 % MT LIQD
7.0000 mL | Freq: Two times a day (BID) | OROMUCOSAL | Status: DC
Start: 2014-12-28 — End: 2014-12-29
  Administered 2014-12-28 – 2014-12-29 (×3): 7 mL via OROMUCOSAL

## 2014-12-28 MED ORDER — MORPHINE SULFATE (PF) 2 MG/ML IV SOLN
2.0000 mg | INTRAVENOUS | Status: DC | PRN
  Administered 2014-12-28: 2 mg via INTRAVENOUS
  Administered 2014-12-28: 2 mg via INTRAVASCULAR
  Administered 2014-12-28 – 2014-12-29 (×3): 2 mg via INTRAVENOUS
  Filled 2014-12-28 (×4): qty 1

## 2014-12-28 MED ORDER — MORPHINE SULFATE (PF) 2 MG/ML IV SOLN
2.0000 mg | Freq: Once | INTRAVENOUS | Status: AC
  Administered 2014-12-28: 2 mg via INTRAVENOUS

## 2014-12-28 NOTE — Progress Notes (Signed)
Patient alert and oriented. Work of breathing at rest though respirations WNL.  Asked Dr. Bard Herbert for another ABG- he declined.  Chest xray negative-Patient given morphine and ativan for shortness of breath- tolerates well- Urine output minimal.

## 2014-12-28 NOTE — Progress Notes (Signed)
Dr hower up to check on pt. At our request changed treatment and also gave pt morphine Shara Blazing, RN

## 2014-12-28 NOTE — Progress Notes (Signed)
RASS 0. Well supported on BiPAP. Immediately decompensates with lots of pseudowheeze upon discontinuation of BiPAP No new complaints Intermittent agitation  Filed Vitals:   12/28/14 0800 12/28/14 0900 12/28/14 1000 12/28/14 1100  BP: 157/103 101/69 99/75 114/83  Pulse: 113 111 100 104  Temp: 97.5 F (36.4 C)     TempSrc: Axillary     Resp: Height:      Weight:      SpO2: 97% 97% 98% 99%   Markedly dyspneic off BiPAP HEENT WNL No JVD noted Markedly diminished BS throughout Diffuse scattered wheezes Prominent pseudowheeze off BiPAP Tachy, reg, no M Abd soft Ext warm, no edema  BMP Latest Ref Rng 12/28/2014 12/26/2014 12/25/2014  Glucose 65 - 99 mg/dL 161(W) 960(A) 540(J)  BUN 6 - 20 mg/dL 81(X) 8 <9(J)  Creatinine 0.44 - 1.00 mg/dL 4.78(G) 9.56 2.13  Sodium 135 - 145 mmol/L 133(L) 132(L) 137  Potassium 3.5 - 5.1 mmol/L 5.1 4.2 4.3  Chloride 101 - 111 mmol/L 93(L) 95(L) 100(L)  CO2 22 - 32 mmol/L Calcium 8.9 - 10.3 mg/dL 9.6 9.3 9.9    CBC Latest Ref Rng 12/28/2014 12/26/2014 12/25/2014  WBC 3.6 - 11.0 K/uL 17.9(H) 4.9 7.9  Hemoglobin 12.0 - 16.0 g/dL 08.6 57.8 46.9  Hematocrit 35.0 - 47.0 % 38.5 38.6 42.6  Platelets 150 - 440 K/uL 307 239 274    CXR: hyperinflated, no acute findings  IMPRESSION: Severe emphysema Severe acute on chronic respiratory failure AECOPD with acute bronchospasm Anxiety complicating resp distress Suspect severe vocal cord dysfunction - not likely the primary problem but a complicating feature   PLAN/REC: Cont ICU level of care Cont NPPV as ordered  Cont nebulized steroids as ordered Cont bronchodilator regimen adjusted Cont systemic steroids dose at current dose Cont low dose PRN lorazepam ordered To remain NPO until off BiPAP and risk of intubation subsides Cont maintenance IVFs until taking POs Medical DVT prophylaxis ordered 8/27 SUP initiated 8/27  CCM X 35 mins  Billy Fischer, MD PCCM service Mobile  [phone] Pager [phone]

## 2014-12-28 NOTE — Progress Notes (Signed)
[facility] - [place] at [facility]   PATIENT NAME: Diane [name]    MR#:  [id number]  DATE OF BIRTH:  1955/10/08  SUBJECTIVE:  CHIEF COMPLAINT:   Chief Complaint  Patient presents with  . Respiratory Distress   not feeling better, still feeling tight in her chest, on BiPAP .Husband at bedside REVIEW OF SYSTEMS:  Review of Systems  Constitutional: Negative for fever, weight loss, malaise/fatigue and diaphoresis.  HENT: Negative for ear discharge, ear pain, hearing loss, nosebleeds, sore throat and tinnitus.   Eyes: Negative for blurred vision and pain.  Respiratory: Positive for cough, shortness of breath and wheezing. Negative for hemoptysis.   Cardiovascular: Negative for chest pain, palpitations, orthopnea and leg swelling.  Gastrointestinal: Negative for heartburn, nausea, vomiting, abdominal pain, diarrhea, constipation and blood in stool.  Genitourinary: Negative for dysuria, urgency and frequency.  Musculoskeletal: Negative for myalgias and back pain.  Skin: Negative for itching and rash.  Neurological: Negative for dizziness, tingling, tremors, focal weakness, seizures, weakness and headaches.  Psychiatric/Behavioral: Negative for depression. The patient is not nervous/anxious.    DRUG ALLERGIES:   Allergies  Allergen Reactions  . Aspirin Swelling  . Levofloxacin Nausea Only  . Penicillins Hives   VITALS:  Blood pressure 114/83, pulse 104, temperature 97.5 F (36.4 C), temperature source Axillary, resp. rate 16, height 5\' 7"  (1.702 m), weight 66.679 kg (147 lb), SpO2 99 %. PHYSICAL EXAMINATION:  Physical Exam  Constitutional: She is oriented to person, place, and time and well-developed, well-nourished, and in no distress.  HENT:  Head: Normocephalic and atraumatic.  Eyes: Conjunctivae and EOM are normal. Pupils are equal, round, and reactive to light.  Neck: Normal range of motion. Neck supple. No tracheal deviation present. No thyromegaly  present.  Cardiovascular: Normal rate, regular rhythm and normal heart sounds.   Pulmonary/Chest: No accessory muscle usage. She is in respiratory distress. She has decreased breath sounds. She has wheezes. She has rhonchi. She exhibits no tenderness.  Abdominal: Soft. Bowel sounds are normal. She exhibits no distension. There is no tenderness.  Musculoskeletal: Normal range of motion.  Neurological: She is alert and oriented to person, place, and time. No cranial nerve deficit.  Skin: Skin is warm and dry. No rash noted.  Psychiatric: Mood and affect normal.   LABORATORY PANEL:   CBC  Recent Labs Lab 12/28/14 0656  WBC 17.9*  HGB 12.8  HCT 38.5  PLT 307   ------------------------------------------------------------------------------------------------------------------ Chemistries   Recent Labs Lab 12/28/14 0656  NA 133*  K 5.1  CL 93*  CO2 25  GLUCOSE 139*  BUN 35*  CREATININE 1.08*  CALCIUM 9.6  AST 37  ALT 27  ALKPHOS 38  BILITOT 0.7   RADIOLOGY:  Dg Chest Port 1 View  12/28/2014   CLINICAL DATA:  Respiratory failure  EXAM: PORTABLE CHEST - 1 VIEW  COMPARISON:  12/25/2014; 12/22/2014; chest CT - 09/18/2014  FINDINGS: Grossly unchanged cardiac silhouette and mediastinal contours. Atherosclerotic plaque within the thoracic aorta. The lungs remain hyperexpanded with flattening of the bilateral diaphragms and thinning of the biapical pulmonary parenchyma. No focal airspace opacities. No pleural effusion or pneumothorax. No evidence of edema. Unchanged bones including old right mid clavicular fracture, incompletely evaluated.  IMPRESSION: Hyperexpanded lungs without acute cardiopulmonary disease.   Electronically Signed   By: Simonne Come M.D.   On: 12/28/2014 09:32   ASSESSMENT AND PLAN:  Diane [name] is a 59 y.o. female with a known history of COPD on 2  L home oxygen, hypertension depression and anxiety presents to the hospital secondary to worsening difficulty  breathing.  #1 Acute on chronic Respiratory Failure due to COPD exacerbation - with a component of vocal cord dysfunction -Continue IV steroids at current dose of 60 mg IV every 12 hours as recommended by pulmonology,  -continue NPPV -Duonebs, every 4 hours. -Continue home inhalers-on Dulera -Continue Daliresp and Singulair -Cough medicines. Switch azithromycin to PO Doxy - Apprciate pulmonary input, discussed with Dr. Darrol Angel - if not much improvement - may need to be intubated  #2 Hypertension-continue Cozaar  #3 depression and anxiety-on Celexa and Klonopin.  #4  Insomnia: prn Ambien  DVT prophylaxis-on Lovenox.    All the records are reviewed and case discussed with Care Management/Social Worker. Management plans discussed with the patient, family and they are in agreement.  CODE STATUS: Full code  She is at high risk for cardio-respi failure and death.  TOTAL TIME (Critical Care) TAKING CARE OF THIS PATIENT: 35 minutes.   More than 50% of the time was spent in counseling/coordination of care: YES  POSSIBLE D/C IN 2-3 DAYS, DEPENDING ON CLINICAL CONDITION and pulmonary evaluation   Jeanine Caven M.D on 12/28/2014 at 1:31 PM  Between 7am to 6pm - Pager - [phone]  After 6pm go to [email or web address] - password [email or web address] [facility]  [place] [facility]  Office  [phone]  CC:  Primary care physician; Sherlene Shams, MD

## 2014-12-29 ENCOUNTER — Inpatient Hospital Stay: Payer: Managed Care, Other (non HMO)

## 2014-12-29 DIAGNOSIS — T8182XD Emphysema (subcutaneous) resulting from a procedure, subsequent encounter: Secondary | ICD-10-CM

## 2014-12-29 LAB — CBC
HCT: 39 % (ref 35.0–47.0)
HCT: 32.5 % — ABNORMAL LOW (ref 35.0–47.0)
HEMOGLOBIN: 12.8 g/dL (ref 12.0–16.0)
Hemoglobin: 10.9 g/dL — ABNORMAL LOW (ref 12.0–16.0)
MCH: 29.7 pg (ref 26.0–34.0)
MCH: 30.4 pg (ref 26.0–34.0)
MCHC: 32.9 g/dL (ref 32.0–36.0)
MCHC: 33.6 g/dL (ref 32.0–36.0)
MCV: 90 fL (ref 80.0–100.0)
MCV: 90.6 fL (ref 80.0–100.0)
PLATELETS: 174 10*3/uL (ref 150–440)
Platelets: 288 10*3/uL (ref 150–440)
RBC: 4.33 MIL/uL (ref 3.80–5.20)
RBC: 3.59 MIL/uL — ABNORMAL LOW (ref 3.80–5.20)
RDW: 13.9 % (ref 11.5–14.5)
RDW: 14 % (ref 11.5–14.5)
WBC: 16.2 10*3/uL — ABNORMAL HIGH (ref 3.6–11.0)
WBC: 7.2 10*3/uL (ref 3.6–11.0)

## 2014-12-29 LAB — BASIC METABOLIC PANEL
ANION GAP: 10 (ref 5–15)
BUN: 39 mg/dL — ABNORMAL HIGH (ref 6–20)
CALCIUM: 8.7 mg/dL — AB (ref 8.9–10.3)
CO2: 27 mmol/L (ref 22–32)
Chloride: 98 mmol/L — ABNORMAL LOW (ref 101–111)
Creatinine, Ser: 0.84 mg/dL (ref 0.44–1.00)
Glucose, Bld: 117 mg/dL — ABNORMAL HIGH (ref 65–99)
Potassium: 4.4 mmol/L (ref 3.5–5.1)
Sodium: 135 mmol/L (ref 135–145)

## 2014-12-29 LAB — GLUCOSE, CAPILLARY
GLUCOSE-CAPILLARY: 176 mg/dL — AB (ref 65–99)
Glucose-Capillary: 98 mg/dL (ref 65–99)

## 2014-12-29 LAB — TRIGLYCERIDES: Triglycerides: 175 mg/dL — ABNORMAL HIGH (ref <150)

## 2014-12-29 MED ORDER — FENTANYL CITRATE (PF) 100 MCG/2ML IJ SOLN
INTRAMUSCULAR | Status: AC
  Administered 2014-12-29: 100 ug via INTRAVENOUS
  Filled 2014-12-29: qty 2

## 2014-12-29 MED ORDER — METHYLPREDNISOLONE SODIUM SUCC 40 MG IJ SOLR
40.0000 mg | Freq: Two times a day (BID) | INTRAMUSCULAR | Status: DC
  Administered 2014-12-29 – 2015-01-02 (×8): 40 mg via INTRAVENOUS
  Filled 2014-12-29 (×8): qty 1

## 2014-12-29 MED ORDER — ROCURONIUM BROMIDE 50 MG/5ML IV SOLN
1.0000 mg/kg | Freq: Once | INTRAVENOUS | Status: AC
  Administered 2014-12-29: 50 mg via INTRAVENOUS
  Filled 2014-12-29: qty 1

## 2014-12-29 MED ORDER — BACID PO TABS
1.0000 | ORAL_TABLET | Freq: Every day | ORAL | Status: DC
  Administered 2014-12-29 – 2014-12-31 (×3): 1
  Filled 2014-12-29 (×5): qty 1

## 2014-12-29 MED ORDER — SODIUM CHLORIDE 0.9 % IV BOLUS (SEPSIS)
1000.0000 mL | Freq: Once | INTRAVENOUS | Status: AC
  Administered 2014-12-29: 1000 mL via INTRAVENOUS

## 2014-12-29 MED ORDER — CETYLPYRIDINIUM CHLORIDE 0.05 % MT LIQD
7.0000 mL | Freq: Two times a day (BID) | OROMUCOSAL | Status: DC
  Administered 2014-12-29 – 2015-01-02 (×9): 7 mL via OROMUCOSAL

## 2014-12-29 MED ORDER — CHLORHEXIDINE GLUCONATE 0.12% ORAL RINSE (MEDLINE KIT)
15.0000 mL | Freq: Two times a day (BID) | OROMUCOSAL | Status: DC
  Administered 2014-12-29 – 2015-01-02 (×9): 15 mL via OROMUCOSAL
  Filled 2014-12-29 (×11): qty 15

## 2014-12-29 MED ORDER — ANTISEPTIC ORAL RINSE SOLUTION (CORINZ)
7.0000 mL | Freq: Four times a day (QID) | OROMUCOSAL | Status: DC
  Administered 2014-12-29 – 2015-01-02 (×17): 7 mL via OROMUCOSAL
  Filled 2014-12-29 (×19): qty 7

## 2014-12-29 MED ORDER — LEVALBUTEROL HCL 1.25 MG/0.5ML IN NEBU
0.6300 mg | INHALATION_SOLUTION | Freq: Four times a day (QID) | RESPIRATORY_TRACT | Status: DC
  Administered 2014-12-29: 0.63 mg via RESPIRATORY_TRACT
  Administered 2014-12-29 – 2014-12-30 (×2): via RESPIRATORY_TRACT
  Filled 2014-12-29 (×3): qty 0.5

## 2014-12-29 MED ORDER — VITAL HIGH PROTEIN PO LIQD
1000.0000 mL | ORAL | Status: DC
  Administered 2014-12-29 (×2)
  Administered 2014-12-29: 1000 mL
  Administered 2014-12-29 (×2)
  Administered 2014-12-29: 30 mL
  Administered 2014-12-30: 1000 mL
  Administered 2014-12-30: 40 mL
  Administered 2014-12-31 (×2): 1000 mL

## 2014-12-29 MED ORDER — ACETAMINOPHEN 325 MG PO TABS
650.0000 mg | ORAL_TABLET | Freq: Four times a day (QID) | ORAL | Status: DC | PRN
  Administered 2014-12-31: 650 mg
  Filled 2014-12-29: qty 2

## 2014-12-29 MED ORDER — ETOMIDATE 2 MG/ML IV SOLN
0.3000 mg/kg | Freq: Once | INTRAVENOUS | Status: AC
  Administered 2014-12-29: 20 mg via INTRAVENOUS

## 2014-12-29 MED ORDER — FENTANYL CITRATE (PF) 100 MCG/2ML IJ SOLN
25.0000 ug | INTRAMUSCULAR | Status: DC | PRN
  Administered 2014-12-29 – 2014-12-30 (×4): 100 ug via INTRAVENOUS
  Filled 2014-12-29 (×4): qty 2

## 2014-12-29 MED ORDER — CITALOPRAM HYDROBROMIDE 20 MG PO TABS
40.0000 mg | ORAL_TABLET | Freq: Every day | ORAL | Status: DC
  Administered 2014-12-29 – 2014-12-31 (×3): 40 mg
  Filled 2014-12-29 (×3): qty 2

## 2014-12-29 MED ORDER — SODIUM CHLORIDE 0.9 % IV SOLN
INTRAVENOUS | Status: DC
  Administered 2014-12-29 (×2): 1000 mL via INTRAVENOUS
  Administered 2014-12-30 – 2014-12-31 (×4): via INTRAVENOUS

## 2014-12-29 MED ORDER — FAMOTIDINE 20 MG PO TABS
20.0000 mg | ORAL_TABLET | Freq: Two times a day (BID) | ORAL | Status: DC
  Administered 2014-12-29 – 2015-01-02 (×9): 20 mg
  Filled 2014-12-29 (×9): qty 1

## 2014-12-29 MED ORDER — FENTANYL CITRATE (PF) 100 MCG/2ML IJ SOLN
100.0000 ug | Freq: Once | INTRAMUSCULAR | Status: AC
  Administered 2014-12-29: 100 ug via INTRAVENOUS

## 2014-12-29 MED ORDER — PROPOFOL 1000 MG/100ML IV EMUL
0.0000 ug/kg/min | INTRAVENOUS | Status: DC
  Administered 2014-12-29: 50 ug/kg/min via INTRAVENOUS
  Administered 2014-12-29: 100 ug/kg/min via INTRAVENOUS
  Administered 2014-12-29: 20 ug/kg/min via INTRAVENOUS
  Administered 2014-12-29 (×2): 100 ug/kg/min via INTRAVENOUS
  Administered 2014-12-29 – 2014-12-30 (×2): 50 ug/kg/min via INTRAVENOUS
  Administered 2014-12-30: 35 ug/kg/min via INTRAVENOUS
  Administered 2014-12-30: 75 ug/kg/min via INTRAVENOUS
  Filled 2014-12-29 (×9): qty 100

## 2014-12-29 MED ORDER — SODIUM CHLORIDE 0.9 % IV BOLUS (SEPSIS)
500.0000 mL | Freq: Once | INTRAVENOUS | Status: AC
  Administered 2014-12-29: 500 mL via INTRAVENOUS

## 2014-12-29 MED ORDER — CHLORHEXIDINE GLUCONATE 0.12 % MT SOLN
15.0000 mL | Freq: Two times a day (BID) | OROMUCOSAL | Status: DC
  Administered 2014-12-29: 15 mL via OROMUCOSAL
  Filled 2014-12-29 (×2): qty 15

## 2014-12-29 MED ORDER — METOPROLOL TARTRATE 1 MG/ML IV SOLN: 2.5000 mg | INTRAVENOUS | Status: DC | PRN

## 2014-12-29 MED ORDER — ACETAMINOPHEN 650 MG RE SUPP: 650.0000 mg | Freq: Four times a day (QID) | RECTAL | Status: DC | PRN

## 2014-12-29 MED ORDER — ENOXAPARIN SODIUM 40 MG/0.4ML ~~LOC~~ SOLN
40.0000 mg | SUBCUTANEOUS | Status: DC
  Administered 2014-12-29 – 2015-01-02 (×5): 40 mg via SUBCUTANEOUS
  Filled 2014-12-29 (×5): qty 0.4

## 2014-12-29 NOTE — Procedures (Signed)
Oral Intubation Procedure Note   Procedure: Intubation Indications: Respiratory insufficiency, failed NPPV Consent: obtained from pt Time Out: Verified patient identification, verified procedure, site/side was marked, verified correct patient position, special equipment/implants available, medications/allergies/relevent history reviewed, required imaging and test results available.   Pre-meds: fentanyl 100 mcg, etomidate 20 mg IV  Neuromuscular blockade: Rocuronium 50 mg IV  Laryngoscope: #3 MAC  Visualization: cords fully visualized  ETT: 8.0 ETT passed on first attempt and secured @ 24 cm at upper gums  Findings: normal upper airway, normal cord   Evaluation:  bilateral BS + color change CXR revealed proper position of ETT  Pt tolerated procedure well without complications   Diane Fischer, MD ; [facility] service Mobile [phone].  After 5:30 PM or weekends, call [phone]

## 2014-12-29 NOTE — Progress Notes (Signed)
CXR postintubation WNL. Abdominal AP xray verification of placement of OG.

## 2014-12-29 NOTE — Progress Notes (Signed)
Paged Dr. Arita Miss concerning patient hypotensive (SBP 70s, MAP 63); and that patient had no labs done today. Patient UOP is decreasing.  MD stated she will enter orders for labs, 1Liter NS IV bolus, and NS at 125 ml/hr. Continue to monitor.

## 2014-12-29 NOTE — Progress Notes (Signed)
Patient tachypneic, WOB increased, anxious. Dr. Park Breed at bedside, spoke with patient and discussed her declining respiratory status, patient agreed to intubation. MD order for 100 mcg Fentanyl, 20 mg Etomidate and 50 mg Rocuronium, administered prior to intubation. Patient intubated by Coral Ceo, RT with Dr. Sung Amabile at bedside, who administered Rocuronium and Etonidate, I administered Fentanyl. MD order for propofol. OG placed by this RN per MD order. Orders entered for CXR and abdominal AP for post-intubation and og placement verification.

## 2014-12-29 NOTE — Progress Notes (Signed)
MAJOR EVENTS/TEST RESULTS: 8/24 admitted with severe emphysema, acute exacerbation of COPD 8/26 transferred to ICU for NPPV 8/28 intubated for extreme dyspnea despite BiPAP  INDWELLING DEVICES:: ETT 8/28 >>   MICRO DATA: MRSA PCR 8/26 >> NEG  ANTIMICROBIALS:  Azithromycin 8/24 >> 8/25 Doxycycline 8/25 >>    SUBJ: RASS +1 prior to intubation. Markedly dyspneic on BiPAP. Therefore intubated this morning  Filed Vitals:   12/29/14 0830 12/29/14 0900 12/29/14 1000 12/29/14 1100  BP:  178/117 107/81 138/103  Pulse:  134  116  Temp:      TempSrc:      Resp:  Height:      Weight:      SpO2: 100% 92%  100%   Markedly dyspneic on BiPAP prior to intubation RASS -3 after intubation on propofol HEENT: missing all of upper teeth, poor lower dentition No JVD noted Prolonged diffuse exp wheezes Tachy, reg, no M Abd soft Ext warm, no edema  BMP Latest Ref Rng 12/28/2014 12/26/2014 12/25/2014  Glucose 65 - 99 mg/dL 604(V) 409(W) 119(J)  BUN 6 - 20 mg/dL 47(W) 8 <2(N)  Creatinine 0.44 - 1.00 mg/dL 5.62(Z) 3.08 6.57  Sodium 135 - 145 mmol/L 133(L) 132(L) 137  Potassium 3.5 - 5.1 mmol/L 5.1 4.2 4.3  Chloride 101 - 111 mmol/L 93(L) 95(L) 100(L)  CO2 22 - 32 mmol/L Calcium 8.9 - 10.3 mg/dL 9.6 9.3 9.9    CBC Latest Ref Rng 12/29/2014 12/28/2014 12/26/2014  WBC 3.6 - 11.0 K/uL 16.2(H) 17.9(H) 4.9  Hemoglobin 12.0 - 16.0 g/dL 84.6 96.2 95.2  Hematocrit 35.0 - 47.0 % 39.0 38.5 38.6  Platelets 150 - 440 K/uL 288 307 239    CXR: hyperinflated, no acute findings, ETT well positioned  IMPRESSION: Severe emphysema Severe acute on chronic respiratory failure AECOPD with acute bronchospasm Failed BiPAP - intubated 8/28 Sinus tachycardia - reactive Hypertension - reactive Hypotension after intubation/propofol - improved after NS bolus Mild hyponatremia Mild AKI, nonoliguric Undernourished Anxiety - controlled on propofol, PRN fentanyl    PLAN/REC: Vent settings  established Vent bundle implemented Daily SBT as indicated Cont nebulized steroids as ordered Cont bronchodilator regimen as ordered Cont systemic steroids dose at current dose DVT px: enoxaparin initiated 8/28 SUP: famotidine initiated 8/28 Monitor BMET intermittently Monitor I/Os Correct electrolytes as indicated Begin TFs 8/28 Sedation with propofol and PRN fentanyl  I have not seen pt's husband this WE. He will need an update 8/29. Goals of care need to be discussed as this appears to be very severe baseline COPD  CCM X 40 mins independent of time spent performing procedures  Billy Fischer, MD PCCM service Mobile [phone] Pager [phone]

## 2014-12-29 NOTE — Progress Notes (Signed)
Patient SR on cardiac monitor. AC ventilation, rate 14, 500 TV 5 Peep, sedated with 100 mcg Propofol. OG with TF infusing 20 ml/hrwith 30 ml flush q 4 hours. NS 125 ml/hr infusing through PIV. Foley catheter with adequate UOP. Family at bedside ( husband and daughter) and updated on patient status and POC.

## 2014-12-29 NOTE — Progress Notes (Signed)
Pt is sleepy and lethargic. Marland Kitchen Remains on bi-pap.inj activan and  Morphine given as per  PRN order. Urine output minimal.lung sound diminished. Resting in bed without any distress.will continue to observe closely.

## 2014-12-29 NOTE — Progress Notes (Addendum)
[facility] - [place] at [facility]   PATIENT NAME: Diane [name]    MR#:  [id number]  DATE OF BIRTH:  November 16, 1955  SUBJECTIVE:  CHIEF COMPLAINT:   Chief Complaint  Patient presents with  . Respiratory Distress   not feeling better, working hard on BiPAP and got intubated today a.m.  REVIEW OF SYSTEMS:  Review of Systems  Unable to perform ROS  DRUG ALLERGIES:   Allergies  Allergen Reactions  . Aspirin Swelling  . Levofloxacin Nausea Only  . Penicillins Hives   VITALS:  Blood pressure 74/53, pulse 96, temperature 99 F (37.2 C), temperature source Oral, resp. rate 17, height 5\' 7"  (1.702 m), weight 66.679 kg (147 lb), SpO2 100 %. PHYSICAL EXAMINATION:  Physical Exam  Constitutional: She appears not jaundiced. She appears not cachectic. She has a sickly appearance. She appears distressed.  HENT:  Head: Normocephalic and atraumatic.  Eyes: Conjunctivae are normal. Pupils are equal, round, and reactive to light.  Neck: No tracheal deviation present. No thyromegaly present.  Cardiovascular: Normal rate, regular rhythm and normal heart sounds.   Pulmonary/Chest: No accessory muscle usage. She is in respiratory distress. She has decreased breath sounds. She has wheezes. She has rhonchi. She exhibits no tenderness.  Abdominal: Soft. Bowel sounds are normal. She exhibits no distension. There is no tenderness.  Musculoskeletal: Normal range of motion.  Neurological: She is alert. No cranial nerve deficit.  Skin: Skin is warm and dry. No rash noted.  Psychiatric: Mood and affect normal.   LABORATORY PANEL:   CBC  Recent Labs Lab 12/29/14 0522  WBC 16.2*  HGB 12.8  HCT 39.0  PLT 288   ------------------------------------------------------------------------------------------------------------------ Chemistries   Recent Labs Lab 12/28/14 0656  NA 133*  K 5.1  CL 93*  CO2 25  GLUCOSE 139*  BUN 35*  CREATININE 1.08*  CALCIUM 9.6  AST 37   ALT 27  ALKPHOS 38  BILITOT 0.7   RADIOLOGY:  Dg Chest 1 View  12/29/2014   CLINICAL DATA:  Intubated.  EXAM: CHEST  1 VIEW  COMPARISON:  12/28/2014  FINDINGS: Endotracheal tube is 4 cm above the carina. Hyperinflation of the lungs compatible with COPD. Heart is normal size. Lungs are clear. No effusions or acute bony abnormality.  IMPRESSION: Endotracheal tube 4 cm above the carina.  COPD.  No active disease.   Electronically Signed   By: Charlett Nose M.D.   On: 12/29/2014 11:20   Dg Abd Portable 1v  12/29/2014   CLINICAL DATA:  OG tube placement  EXAM: PORTABLE ABDOMEN - 1 VIEW  COMPARISON:  None.  FINDINGS: OG tube tip is in the distal stomach. Gas within nondistended bowel diffusely. No evidence of bowel obstruction. No organomegaly. Visualized lung bases clear.  IMPRESSION: OG tube tip in the distal stomach.   Electronically Signed   By: Charlett Nose M.D.   On: 12/29/2014 11:15   ASSESSMENT AND PLAN:  Diane [name] is a 59 y.o. female with a known history of COPD on 2 L home oxygen, hypertension depression and anxiety presents to the hospital secondary to worsening difficulty breathing.  #1 Acute on chronic Respiratory Failure due to COPD exacerbation - with a component of vocal cord dysfunction -Patient failed on BiPAP with no significant improvement and got intubated-day #0 -Vent management per pulmonology -Continue IV steroids at current dose  but will consider to increase the frequency to every 6 hours  -Duonebs, every 4 hours. -We will consider to resume  home inhalers- Dulera after extubation -Continue Daliresp and Singulair -Cough medicines. Switch azithromycin to PO Doxy - Apprciate pulmonary input, discussed with Dr. Darrol Angel -Check a.m. labs and chest x-ray -  #2 Hypotension-could be from adverse effect of propofol  hold Cozaar in view of hypotension Provide normal saline bolus and continue IV fluids at 1 25 cc/h Will consider pressors as needed basis to maintain map at or  greater than 65  #3 depression and anxiety-hold Celexa and Klonopin.  #4  Insomnia: Currently patient is sedated   DVT pro currently patient is sedatedphylaxis-on Lovenox.    All the records are reviewed and case discussed with Care Management/Social Worker. Management plans discussed with the patient, family and they are in agreement.  CODE STATUS: Full code  She is at high risk for cardio-respi failure and death.  TOTAL TIME (Critical Care) TAKING CARE OF THIS PATIENT: 35 minutes.   More than 50% of the time was spent in counseling/coordination of care: YES  POSSIBLE D/C IN 2-3 DAYS, DEPENDING ON CLINICAL CONDITION and pulmonary evaluation   Derian Dimalanta M.D on 12/29/2014 at 2:25 PM  Between 7am to 6pm - Pager - [phone]  After 6pm go to [email or web address] - password [email or web address] [facility]  [facility]  Office  [phone]  CC:  Primary care physician; Sherlene Shams, MD

## 2014-12-30 ENCOUNTER — Inpatient Hospital Stay: Payer: Managed Care, Other (non HMO)

## 2014-12-30 DIAGNOSIS — J9600 Acute respiratory failure, unspecified whether with hypoxia or hypercapnia: Secondary | ICD-10-CM

## 2014-12-30 LAB — BLOOD GAS, ARTERIAL
ALLENS TEST (PASS/FAIL): POSITIVE — AB
Acid-Base Excess: 3.2 mmol/L — ABNORMAL HIGH (ref 0.0–3.0)
Acid-Base Excess: 6.2 mmol/L — ABNORMAL HIGH (ref 0.0–3.0)
Acid-Base Excess: 4.9 mmol/L — ABNORMAL HIGH (ref 0.0–3.0)
Allens test (pass/fail): POSITIVE — AB
BICARBONATE: 31.2 meq/L — AB (ref 21.0–28.0)
BICARBONATE: 32.7 meq/L — AB (ref 21.0–28.0)
Bicarbonate: 29.9 mEq/L — ABNORMAL HIGH (ref 21.0–28.0)
DELIVERY SYSTEMS: POSITIVE
EXPIRATORY PAP: 8
FIO2: 0.5
FIO2: 0.4
FIO2: 0.4
Inspiratory PAP: 14
LHR: 14 {breaths}/min
LHR: 14 {breaths}/min
MECHVT: 500 mL
MECHVT: 500 mL
Mechanical Rate: 12
O2 SAT: 99.7 %
O2 Saturation: 99.1 %
O2 Saturation: 95.2 %
PATIENT TEMPERATURE: 37
PCO2 ART: 46 mmHg (ref 32.0–48.0)
PEEP/CPAP: 5 cmH2O
PEEP: 5 cmH2O
PH ART: 7.36 (ref 7.350–7.450)
PO2 ART: 130 mmHg — AB (ref 83.0–108.0)
Patient temperature: 37
Patient temperature: 37
pCO2 arterial: 53 mmHg — ABNORMAL HIGH (ref 32.0–48.0)
pCO2 arterial: 62 mmHg — ABNORMAL HIGH (ref 32.0–48.0)
pH, Arterial: 7.44 (ref 7.350–7.450)
pH, Arterial: 7.33 — ABNORMAL LOW (ref 7.350–7.450)
pO2, Arterial: 215 mmHg — ABNORMAL HIGH (ref 83.0–108.0)
pO2, Arterial: 82 mmHg — ABNORMAL LOW (ref 83.0–108.0)

## 2014-12-30 LAB — BASIC METABOLIC PANEL
Anion gap: 4 — ABNORMAL LOW (ref 5–15)
BUN: 30 mg/dL — ABNORMAL HIGH (ref 6–20)
CALCIUM: 8.6 mg/dL — AB (ref 8.9–10.3)
CO2: 30 mmol/L (ref 22–32)
CREATININE: 0.68 mg/dL (ref 0.44–1.00)
Chloride: 102 mmol/L (ref 101–111)
GFR calc non Af Amer: >60 mL/min (ref >60)
Glucose, Bld: 139 mg/dL — ABNORMAL HIGH (ref 65–99)
Potassium: 4.6 mmol/L (ref 3.5–5.1)
SODIUM: 136 mmol/L (ref 135–145)

## 2014-12-30 LAB — CBC
HCT: 33 % — ABNORMAL LOW (ref 35.0–47.0)
Hemoglobin: 11.1 g/dL — ABNORMAL LOW (ref 12.0–16.0)
MCH: 30.7 pg (ref 26.0–34.0)
MCHC: 33.6 g/dL (ref 32.0–36.0)
MCV: 91.3 fL (ref 80.0–100.0)
PLATELETS: 174 10*3/uL (ref 150–440)
RBC: 3.61 MIL/uL — AB (ref 3.80–5.20)
RDW: 13.9 % (ref 11.5–14.5)
WBC: 10.3 10*3/uL (ref 3.6–11.0)

## 2014-12-30 LAB — GLUCOSE, CAPILLARY
GLUCOSE-CAPILLARY: 119 mg/dL — AB (ref 65–99)
GLUCOSE-CAPILLARY: 191 mg/dL — AB (ref 65–99)
Glucose-Capillary: 150 mg/dL — ABNORMAL HIGH (ref 65–99)

## 2014-12-30 MED ORDER — IPRATROPIUM-ALBUTEROL 0.5-2.5 (3) MG/3ML IN SOLN
RESPIRATORY_TRACT | Status: AC
  Administered 2014-12-30: 3 mL
  Filled 2014-12-30: qty 3

## 2014-12-30 MED ORDER — BUDESONIDE 0.25 MG/2ML IN SUSP
0.5000 mg | Freq: Two times a day (BID) | RESPIRATORY_TRACT | Status: DC
  Administered 2014-12-31: 0.5 mg via RESPIRATORY_TRACT
  Administered 2014-12-31 – 2015-01-01 (×2): 0.25 mg via RESPIRATORY_TRACT
  Filled 2014-12-30 (×4): qty 4

## 2014-12-30 MED ORDER — IPRATROPIUM-ALBUTEROL 0.5-2.5 (3) MG/3ML IN SOLN
3.0000 mL | RESPIRATORY_TRACT | Status: DC
  Administered 2014-12-30 – 2015-01-01 (×11): 3 mL via RESPIRATORY_TRACT
  Filled 2014-12-30 (×12): qty 3

## 2014-12-30 MED ORDER — MIDAZOLAM HCL 2 MG/2ML IJ SOLN: 2.0000 mg | INTRAMUSCULAR | Status: DC | PRN

## 2014-12-30 MED ORDER — SENNOSIDES-DOCUSATE SODIUM 8.6-50 MG PO TABS: 1.0000 | ORAL_TABLET | Freq: Two times a day (BID) | ORAL | Status: DC | PRN

## 2014-12-30 MED ORDER — FENTANYL 2500MCG IN NS 250ML (10MCG/ML) PREMIX INFUSION
0.0000 ug/h | INTRAVENOUS | Status: DC
  Administered 2014-12-30: 50 ug/h via INTRAVENOUS
  Administered 2014-12-31: 200 ug/h via INTRAVENOUS
  Administered 2014-12-31: 15 ug/h via INTRAVENOUS
  Administered 2015-01-01: 200 ug/h via INTRAVENOUS
  Administered 2015-01-01: 0 ug/h via INTRAVENOUS
  Administered 2015-01-02: 20 ug/h via INTRAVENOUS
  Filled 2014-12-30 (×6): qty 250

## 2014-12-30 NOTE — Progress Notes (Signed)
CC follow up resp failure  Remains intuubaed,sedated Plan for SAT/SBT today, will start precedex and assess for weaning trials   MAJOR EVENTS/TEST RESULTS: 8/24 admitted with severe emphysema, acute exacerbation of COPD 8/26 transferred to ICU for NPPV 8/28 intubated for extreme dyspnea despite BiPAP  INDWELLING DEVICES:: ETT 8/28 >>   MICRO DATA: MRSA PCR 8/26 >> NEG  ANTIMICROBIALS:  Azithromycin 8/24 >> 8/25 Doxycycline 8/25 >>    SUBJ: Intubated,sedated remains critically ill   Filed Vitals:   12/30/14 0518 12/30/14 0600 12/30/14 0700 12/30/14 0800  BP:   Pulse: 101 87 82 82  Temp:    98.8 F (37.1 C)  TempSrc:    Oral  Resp: Height:      Weight:      SpO2: 98% 100% 100% 100%     Review of Systems:  unable to obtain due to critical illness   Physical Examination:   VS: BP 95/57 mmHg  Pulse 82  Temp(Src) 98.8 F (37.1 C) (Oral)  Resp 13  Ht  (1.702 m)  Wt 147 lb (66.679 kg)  BMI 23.02 kg/m2  SpO2 100%  General Appearance: intubated,sedated Neuro:GCS8T  HEENT: PERRLA, EOM intact,  Pulmonary: normal breath sounds., + wheezing, No rales;   CardiovascularNormal S1,S2.  No m/r/g.  Abdominal aorta pulsation normal.    Abdomen: Benign, Soft, non-tender, No masses, hepatosplenomegaly, No lymphadenopathy Renal:  No costovertebral tenderness  GU:  No performed at this time. Endoc: No evident thyromegaly, no signs of acromegaly or Cushing features Skin:   warm, no rashes, no ecchymosis  Extremities: normal, no cyanosis, clubbing, no edema, warm with normal capillary refill.    BMP Latest Ref Rng 12/30/2014 12/29/2014 12/28/2014  Glucose 65 - 99 mg/dL 161(W) 960(A) 540(J)  BUN 6 - 20 mg/dL 81(X) 91(Y) 78(G)  Creatinine 0.44 - 1.00 mg/dL 9.56 2.13 0.86(V)  Sodium 135 - 145 mmol/L 136 135 133(L)  Potassium 3.5 - 5.1 mmol/L 4.6 4.4 5.1  Chloride 101 - 111 mmol/L 102 98(L) 93(L)  CO2 22 - 32 mmol/L Calcium 8.9  - 10.3 mg/dL 7.8(I) 6.9(G) 9.6    CBC Latest Ref Rng 12/30/2014 12/29/2014 12/29/2014  WBC 3.6 - 11.0 K/uL 10.3 7.2 16.2(H)  Hemoglobin 12.0 - 16.0 g/dL 11.1(L) 10.9(L) 12.8  Hematocrit 35.0 - 47.0 % 33.0(L) 32.5(L) 39.0  Platelets 150 - 440 K/uL 174 174 288     IMPRESSION:  59 yo white female admitted to ICU for acute and severe resp failure from acute COPD exacerbation Severe emphysema Severe acute on chronic respiratory failure AECOPD with acute bronchospasm Failed BiPAP - intubated 8/28 Mild hyponatremia Mild AKI, nonoliguric Undernourished Anxiety - controlled on propofol, PRN fentanyl    PLAN/REC: Vent settings established-plan for SAT/SBT -start precedex-Vent bundle implemented -Cont nebulized steroids as ordered -Cont bronchodilator regimen as ordered -Cont systemic steroids dose at current dose -DVT px: enoxaparin initiated 8/28 -SUP: famotidine initiated 8/28 -Monitor BMET intermittently -Begin TFs 8/28 Sedation with propofol and PRN fentanyl  I anticipate prolonged ICU LOS and prolonged Vent requirement  I have personally obtained a history, examined the patient, evaluated Pertinent laboratory and RadioGraphic/imaging results, and  formulated the assessment and plan   The Patient requires high complexity decision making for assessment and support, frequent evaluation and titration of therapies, application of advanced monitoring technologies and extensive interpretation of multiple databases. Critical Care Time devoted to patient care services described in this note is 40  minutes.   Overall, patient is critically ill, prognosis is guarded.  Patient  risk for cardiac arrest and death.    Lucie Leather, M.D.  [facility]  Medical Director [facility] Medical Director [facility] Cardio-Pulmonary Department

## 2014-12-30 NOTE — Care Management Note (Signed)
Case Management Note  Patient Details  Name: Diane [name] MRN: [id number] Date of Birth: 06/28/1955  Subjective/Objective:  ICU 8/27 for COPD exacerbation. Intubated 8/28. Sedated. Discussed LTACH with Dr. Belia Heman. Not appropriate at this time. Following                  Action/Plan:   Expected Discharge Date:                  Expected Discharge Plan:     In-House Referral:     Discharge planning Services     Post Acute Care Choice:    Choice offered to:     DME Arranged:    DME Agency:     HH Arranged:    HH Agency:     Status of Service:  In process, will continue to follow  Medicare Important Message Given:    Date Medicare IM Given:    Medicare IM give by:    Date Additional Medicare IM Given:    Additional Medicare Important Message give by:     If discussed at Long Length of Stay Meetings, dates discussed:    Additional Comments:  Marily Memos, RN 12/30/2014, 3:49 PM

## 2014-12-30 NOTE — Progress Notes (Signed)
Pt remains intubated, sedated on propofol at 81mcg/kg/min.   Pt also getting prn fentanyl pushes 3x.  Pupils are equal and reactive.  Pt has been sinus rhythm, sinus brady on the cardiac monitor.  Foley remains in place.  Pt is tolerating tube feeds which have been titrated as ordered to goal.  Pt has not had any residuals.  Pt is resting comfortably.

## 2014-12-30 NOTE — Progress Notes (Signed)
[facility] Physicians - [place] at [facility]   PATIENT NAME: Diane [name]    MR#:  [id number]  DATE OF BIRTH:  Aug 18, 1955  SUBJECTIVE:  CHIEF COMPLAINT:   Chief Complaint  Patient presents with  . Respiratory Distress   got intubated yesterday for respiratory distress REVIEW OF SYSTEMS:  Review of Systems  Unable to perform ROS  DRUG ALLERGIES:   Allergies  Allergen Reactions  . Aspirin Swelling  . Levofloxacin Nausea Only  . Penicillins Hives   VITALS:  Blood pressure 86/57, pulse 86, temperature 98.8 F (37.1 C), temperature source Oral, resp. rate 13, height  (1.702 m), weight 66.679 kg (147 lb), SpO2 100 %. PHYSICAL EXAMINATION:  Physical Exam  Constitutional: She appears not jaundiced. She appears not cachectic. She does not have a sickly appearance. No distress.  HENT:  Head: Normocephalic and atraumatic.  Eyes: Conjunctivae are normal.  Neck: No tracheal deviation present. No thyromegaly present.  Cardiovascular: Normal rate, regular rhythm and normal heart sounds.   Pulmonary/Chest: No accessory muscle usage. No respiratory distress. She has decreased breath sounds. She has wheezes. She has rhonchi. She exhibits no tenderness.  Abdominal: Soft. Bowel sounds are normal. She exhibits no distension. There is no tenderness.  Musculoskeletal: Normal range of motion.  Neurological: She is alert. No cranial nerve deficit.  Skin: Skin is warm and dry. No rash noted.  Psychiatric: Mood and affect normal.   LABORATORY PANEL:   CBC  Recent Labs Lab 12/30/14 0428  WBC 10.3  HGB 11.1*  HCT 33.0*  PLT 174   ------------------------------------------------------------------------------------------------------------------ Chemistries   Recent Labs Lab 12/28/14 0656  12/30/14 0428  NA 133*  < > 136  K 5.1  < > 4.6  CL 93*  < > 102  CO2 25  < > 30  GLUCOSE 139*  < > 139*  BUN 35*  < > 30*  CREATININE 1.08*  < > 0.68  CALCIUM 9.6  < >  8.6*  AST 37  --   --   ALT 27  --   --   ALKPHOS 38  --   --   BILITOT 0.7  --   --   < > = values in this interval not displayed. RADIOLOGY:  Dg Chest Port 1 View  12/30/2014   CLINICAL DATA:  Respiratory failure  EXAM: PORTABLE CHEST - 1 VIEW  COMPARISON:  December 29, 2014  FINDINGS: The heart size and mediastinal contours are within normal limits. Endotracheal tube is identified distal tip 5.2 cm from carina. Nasogastric is unchanged. Both lungs are clear. The lungs are hyperinflated. The visualized skeletal structures are unremarkable.  IMPRESSION: No active cardiopulmonary disease.  Emphysema.   Electronically Signed   By: Sherian Rein M.D.   On: 12/30/2014 07:27   ASSESSMENT AND PLAN:  Athziry Millican is a 59 y.o. female with a known history of COPD on 2 L home oxygen, hypertension depression and anxiety presents to the hospital secondary to worsening difficulty breathing.  #1 Acute on chronic Respiratory Failure due to COPD exacerbation - with a component of vocal cord dysfunction -Patient failed on BiPAP with no significant improvement and got intubated-day #1 -Vent management per pulmonology -Continue IV steroids at current dose   -Duonebs, every 4 hours. -We will consider to resume  home inhalers- Dulera after extubation -Cough medicines. Switched azithromycin to PO Doxy - Apprciate pulmonary input -Check a.m. labs  -  #2 Hypotension-could be from adverse effect of propofol  hold Cozaar in view of hypotension Provide normal saline bolus and continue IV fluids at 1 25 cc/h Will consider pressors as needed basis to maintain map at or greater than 65  #3 depression and anxiety-hold Celexa and Klonopin.  #4  Insomnia: Currently patient is sedated   DVT pro currently patient is sedatedphylaxis-on Lovenox.    All the records are reviewed and case discussed with Care Management/Social Worker. Management plans discussed with the patient, family and they are in  agreement.  CODE STATUS: Full code  She is at high risk for cardio-respi failure and death.  TOTAL TIME (Critical Care) TAKING CARE OF THIS PATIENT: 35 minutes.   More than 50% of the time was spent in counseling/coordination of care: YES  Discharge plan, DEPENDING ON CLINICAL CONDITION and pulmonary evaluation   Avaley Coop M.D on 12/30/2014 at 2:19 PM  Between 7am to 6pm - Pager - [phone]  After 6pm go to [email or web address] - password [email or web address] [facility]  [facility]  Office  [phone]  CC:  Primary care physician; Sherlene Shams, MD

## 2014-12-30 NOTE — Progress Notes (Signed)
This note also relates to the following rows which could not be included: BP - Cannot attach notes to unvalidated device data MAP (mmHg) - Cannot attach notes to unvalidated device data ECG Heart Rate - Cannot attach notes to unvalidated device data Resp - Cannot attach notes to unvalidated device data End Tidal CO2 (EtCO2) - Cannot attach notes to unvalidated device data   Pt being bathed

## 2014-12-30 NOTE — Progress Notes (Signed)
Initial Nutrition Assessment      INTERVENTION:  EN: Recommend to continue vital high protein at goal rate of 24ml/hr to meet nutritional needs.  Provides 960 kcals (additional 475 kcals from diprivan), 84 g of protein and free water.  Noted free water flush of 30ml q 4 hr for tube maintenance.     NUTRITION DIAGNOSIS:   Inadequate oral intake related to acute illness as evidenced by NPO status.    GOAL:   Provide needs based on ASPEN/SCCM guidelines    MONITOR:    (Energy intake, Digestive system)  REASON FOR ASSESSMENT:   Consult Enteral/tube feeding initiation and management  ASSESSMENT:      Pt admitted with severe emphysema, acute COPD exacerbation on vent  Past Medical History  Diagnosis Date  . COPD (chronic obstructive pulmonary disease)   . Hyperlipidemia   . Tobacco abuse   . Depression   . Anxiety   . Hypertension     Current Nutrition: tolerating vital high protein at 21ml/hr  Food/Nutrition-Related History: unable to obtain   Medications: NS at 153ml/hr, diprivan (providing 475 kcals over next 24 hr), constipation protocol started  Electrolyte/Renal Profile and Glucose Profile:   Recent Labs Lab 12/28/14 0656 12/29/14 1444 12/30/14 0428  NA 133* 135 136  K 5.1 4.4 4.6  CL 93* 98* 102  CO2 BUN 35* 39* 30*  CREATININE 1.08* 0.84 0.68  CALCIUM 9.6 8.7* 8.6*  GLUCOSE 139* 117* 139*   Protein Profile:  Recent Labs Lab 12/28/14 0656  ALBUMIN 4.6    Gastrointestinal Profile: no signs or symptoms of GI intolerance Last BM: PTA?   Nutrition-Focused Physical Exam Findings:  Unable to complete Nutrition-Focused physical exam at this time.     Weight Change: wt encounters reviewed     Diet Order:  Diet NPO time specified  Skin:   reviewed   Height:   Ht Readings from Last 1 Encounters:  12/25/14  (1.702 m)    Weight:   Wt Readings from Last 1 Encounters:  12/25/14 147 lb (66.679 kg)      BMI:  Body mass index is 23.02 kg/(m^2).  Estimated Nutritional Needs:   Kcal:  TEE 1474 kcals/d (Ve 8.4, Tmax 37.1)  Protein:  (1.2-2.0 g/kg) 80-134 g/d  Fluid:  (25-11ml/kg) 1675-2084ml/d  EDUCATION NEEDS:   No education needs identified at this time   HIGH Care Level  Basil Buffin B. Freida Busman, RD, LDN [phone] (pager)

## 2014-12-30 NOTE — Progress Notes (Signed)
PHARMACY - CRITICAL CARE PROGRESS NOTE  Pharmacy Consult for Constipation Prevention    Allergies  Allergen Reactions  . Aspirin Swelling  . Levofloxacin Nausea Only  . Penicillins Hives    Patient Measurements: Height:  (170.2 cm) Weight: 147 lb (66.679 kg) IBW/kg (Calculated) : 61.6   Vital Signs: Temp: 98.8 F (37.1 C) (08/29 0800) Temp Source: Oral (08/29 0800) BP: 86/57 mmHg (08/29 1000) Pulse Rate: 86 (08/29 1000) Intake/Output from previous day: 08/28 0701 - 08/29 0700 In: 2990.8 [I.V.:2446.5; NG/GT:544.3] Out: 950 [Urine:950] Intake/Output from this shift: Total I/O In: 1000 [IV Piggyback:1000] Out: -  Vent settings for last 24 hours: Vent Mode:  [-] PRVC FiO2 (%):  [35 %-40 %] 35 % Set Rate:  [14 bmp] 14 bmp Vt Set:  [500 mL] 500 mL PEEP:  [5 cmH20] 5 cmH20   No data found.  No data found.     Labs:  Recent Labs  12/28/14 0656 12/29/14 0522 12/29/14 1444 12/30/14 0428  WBC 17.9* 16.2* 7.2 10.3  HGB 12.8 12.8 10.9* 11.1*  HCT 38.5 39.0 32.5* 33.0*  PLT 307 288 174 174  CREATININE 1.08*  --  0.84 0.68  ALBUMIN 4.6  --   --   --   PROT 7.2  --   --   --   AST 37  --   --   --   ALT 27  --   --   --   ALKPHOS 38  --   --   --   BILITOT 0.7  --   --   --    Estimated Creatinine Clearance: 74.5 mL/min (by C-G formula based on Cr of 0.68).   Recent Labs  12/29/14 2353 12/30/14 0015 12/30/14 0804  GLUCAP 176* 191* 150*    Microbiology: Recent Results (from the past 720 hour(s))  MRSA PCR Screening     Status: None   Collection Time: 12/27/14  7:14 PM  Result Value Ref Range Status   MRSA by PCR NEGATIVE NEGATIVE Final    Comment:        The GeneXpert MRSA Assay (FDA approved for NASAL specimens only), is one component of a comprehensive MRSA colonization surveillance program. It is not intended to diagnose MRSA infection nor to guide or monitor treatment for MRSA infections.     Medications:  Scheduled:  .  antiseptic oral rinse  7 mL Mouth Rinse q12n4p  . antiseptic oral rinse  7 mL Mouth Rinse QID  . budesonide  0.5 mg Nebulization BID  . chlorhexidine gluconate  15 mL Mouth Rinse BID  . citalopram  40 mg Per Tube Daily  . doxycycline (VIBRAMYCIN) IV  100 mg Intravenous Q12H  . enoxaparin (LOVENOX) injection  40 mg Subcutaneous Q24H  . famotidine  20 mg Per Tube BID  . feeding supplement (VITAL HIGH PROTEIN)  1,000 mL Per Tube Q24H  . ipratropium-albuterol  3 mL Nebulization Q4H  . lactobacillus acidophilus  1 tablet Per Tube Daily  . methylPREDNISolone (SOLU-MEDROL) injection  40 mg Intravenous Q12H  . sodium chloride  3 mL Intravenous Q12H   Infusions:  . sodium chloride 125 mL/hr at 12/30/14 0238  . propofol (DIPRIVAN) infusion 45 mcg/kg/min (12/30/14 0831)   PRN: acetaminophen **OR** acetaminophen, alum & mag hydroxide-simeth, fentaNYL (SUBLIMAZE) injection, hydrALAZINE, levalbuterol, LORazepam, metoprolol, [DISCONTINUED] ondansetron **OR** ondansetron (ZOFRAN) IV, senna-docusate  Assessment: 59 y/o F intubated, sedated with no BM charted to date.   Plan:  Will begin senna/docusate bid prn  and f/u am.   Valentina Gu 12/30/2014,12:02 PM

## 2014-12-31 ENCOUNTER — Inpatient Hospital Stay: Payer: Managed Care, Other (non HMO)

## 2014-12-31 LAB — BLOOD GAS, ARTERIAL
ACID-BASE EXCESS: 4.9 mmol/L — AB (ref 0.0–3.0)
Allens test (pass/fail): POSITIVE — AB
BICARBONATE: 31.4 meq/L — AB (ref 21.0–28.0)
FIO2: 0.4
LHR: 14 {breaths}/min
Mechanical Rate: 14
O2 SAT: 93.9 %
PEEP/CPAP: 5 cmH2O
PH ART: 7.38 (ref 7.350–7.450)
Patient temperature: 37
VT: 500 mL
pCO2 arterial: 53 mmHg — ABNORMAL HIGH (ref 32.0–48.0)
pO2, Arterial: 72 mmHg — ABNORMAL LOW (ref 83.0–108.0)

## 2014-12-31 LAB — GLUCOSE, CAPILLARY
GLUCOSE-CAPILLARY: 146 mg/dL — AB (ref 65–99)
Glucose-Capillary: 133 mg/dL — ABNORMAL HIGH (ref 65–99)
Glucose-Capillary: 133 mg/dL — ABNORMAL HIGH (ref 65–99)

## 2014-12-31 MED ORDER — VITAL HIGH PROTEIN PO LIQD
1000.0000 mL | ORAL | Status: DC
  Administered 2014-12-31 – 2015-01-01 (×2): 1000 mL

## 2014-12-31 MED ORDER — SENNOSIDES-DOCUSATE SODIUM 8.6-50 MG PO TABS
1.0000 | ORAL_TABLET | Freq: Two times a day (BID) | ORAL | Status: DC
  Administered 2014-12-31 – 2015-01-02 (×4): 1 via ORAL
  Filled 2014-12-31 (×4): qty 1

## 2014-12-31 NOTE — Progress Notes (Signed)
[facility] - [place] at [facility]   PATIENT NAME: Diane [name]    MR#:  [id number]  DATE OF BIRTH:  February 25, 1956  SUBJECTIVE:  Patient intubated on that. Family at bedside. Patient is on spontaneous mode. Heart rates are 133. REVIEW OF SYSTEMS:  Review of Systems  Unable to perform ROS  DRUG ALLERGIES:   Allergies  Allergen Reactions  . Aspirin Swelling  . Levofloxacin Nausea Only  . Penicillins Hives   VITALS:  Blood pressure 140/92, pulse 123, temperature 99.2 F (37.3 C), temperature source Oral, resp. rate 14, height  (1.702 m), weight 66.679 kg (147 lb), SpO2 94 %. PHYSICAL EXAMINATION:  Physical Exam  Constitutional: She appears not jaundiced. She appears not cachectic. She has a sickly appearance. No distress.  On vent  HENT:  Head: Normocephalic and atraumatic.  Eyes: Conjunctivae are normal. Pupils are equal, round, and reactive to light.  Neck: No tracheal deviation present. No thyromegaly present.  Cardiovascular: Normal rate, regular rhythm and normal heart sounds.   Pulmonary/Chest: No accessory muscle usage. No respiratory distress. She has decreased breath sounds. She has no wheezes. She has rhonchi. She has no rales. She exhibits no tenderness.  Abdominal: Soft. Bowel sounds are normal. She exhibits no distension. There is no tenderness. There is no rebound and no guarding.  Musculoskeletal: Normal range of motion.  Neurological:  On vent lethargic   Skin: Skin is warm and dry. No rash noted.  Psychiatric: Mood and affect normal.   LABORATORY PANEL:   CBC  Recent Labs Lab 12/30/14 0428  WBC 10.3  HGB 11.1*  HCT 33.0*  PLT 174   ------------------------------------------------------------------------------------------------------------------ Chemistries   Recent Labs Lab 12/28/14 0656  12/30/14 0428  NA 133*  < > 136  K 5.1  < > 4.6  CL 93*  < > 102  CO2 25  < > 30  GLUCOSE 139*  < > 139*  BUN 35*  < > 30*   CREATININE 1.08*  < > 0.68  CALCIUM 9.6  < > 8.6*  AST 37  --   --   ALT 27  --   --   ALKPHOS 38  --   --   BILITOT 0.7  --   --   < > = values in this interval not displayed. RADIOLOGY:  Dg Chest Port 1 View  12/31/2014   CLINICAL DATA:  59 year old female with COPD, hypertension and tobacco use on ventilator. Subsequent encounter.  EXAM: PORTABLE CHEST - 1 VIEW  COMPARISON:  12/30/2014.  FINDINGS: Endotracheal tube tip 4.6 cm above the carina. Nasogastric tube courses below the diaphragm. Tip is not included on the present exam.  Hyperinflation of the lungs. No segmental infiltrate, congestive heart failure, gross pneumothorax or plain film evidence of pulmonary malignancy.  Heart size within normal limits.  Calcified aorta.  Remote right clavicle fracture with bony overgrowth.  IMPRESSION: Hyperinflation of the lungs.   Electronically Signed   By: Lacy Duverney M.D.   On: 12/31/2014 07:46   ASSESSMENT AND PLAN:  [place] Welcome is a 59 y.o. female with a known history of COPD on 2 L home oxygen, hypertension depression and anxiety presents to the hospital secondary to worsening difficulty breathing.  1 Acute on chronic Respiratory Failure due to COPD exacerbation - with a component of vocal cord dysfunction -Patient failed on BiPAP with no significant improvement and got intubated-day #1 -Vent management per pulmonology currently on pressure support -Continue IV steroids -Duonebs,  every 4 hours. -We will consider to resume  home inhalers- Dulera after extubation -Continue Daliresp and Singulair -Continue Doxy - Apprciate pulmonary input,   2 Hypotension-could be from adverse effect of propofol  Blood pressure has improved. Continue to monitor  #3 depression and anxiety-hold Celexa and Klonopin.        . Management plans discussed with the family and they are in agreement.  CODE STATUS: Full code  She is at high risk for cardio-respiratory failure and death.  TOTAL TIME  (Critical Care) TAKING CARE OF THIS PATIENT: 35 minutes.   More than 50% of the time was spent in counseling/coordination of care: YES  POSSIBLE D/C IN 3-4 DAYS, DEPENDING ON CLINICAL CONDITION and pulmonary evaluation   Rayn Enderson M.D on 12/31/2014 at 12:11 PM  Between 7am to 6pm - Pager - [phone]  After 6pm go to [email or web address] - password [email or web address] [facility]  [facility] [place]  Office  [phone]  CC:  Primary care physician; Sherlene Shams, MD

## 2014-12-31 NOTE — Progress Notes (Signed)
Nutrition Follow-up      INTERVENTION:  EN: recommend increasing vital high protein to goal rate of 62ml/hr secondary to diprivan off at this time.  Will provide 1440 kcals, 126 gm of protein and free water.  Continue current flush.     NUTRITION DIAGNOSIS:   Inadequate oral intake related to acute illness as evidenced by NPO status.    GOAL:   Provide needs based on ASPEN/SCCM guidelines    MONITOR:    (Energy intake, Digestive system)  REASON FOR ASSESSMENT:   Consult Enteral/tube feeding initiation and management  ASSESSMENT:      Pt remains on vent.   Current Nutrition: tolerating vital high protein at 72ml/hr Residuals 0-40ml noted    Gastrointestinal Profile: Last BM: 8/25   Medications: reviewed  Electrolyte/Renal Profile and Glucose Profile:   Recent Labs Lab 12/28/14 0656 12/29/14 1444 12/30/14 0428  NA 133* 135 136  K 5.1 4.4 4.6  CL 93* 98* 102  CO2 BUN 35* 39* 30*  CREATININE 1.08* 0.84 0.68  CALCIUM 9.6 8.7* 8.6*  GLUCOSE 139* 117* 139*   Protein Profile:  Recent Labs Lab 12/28/14 0656  ALBUMIN 4.6      Weight Trend since Admission: Filed Weights   12/25/14 1147  Weight: 147 lb (66.679 kg)      Diet Order:  Diet NPO time specified  Skin:   reviewed     Height:   Ht Readings from Last 1 Encounters:  12/25/14  (1.702 m)    Weight:   Wt Readings from Last 1 Encounters:  12/25/14 147 lb (66.679 kg)     BMI:  Body mass index is 23.02 kg/(m^2).  Estimated Nutritional Needs:   Kcal:  TEE 1474 kcals/d (Ve 8.4, Tmax 37.1)  Protein:  (1.2-2.0 g/kg) 80-134 g/d  Fluid:  (25-28ml/kg) 1675-2034ml/d  EDUCATION NEEDS:   No education needs identified at this time  HIGH Care Level  Amorina Doerr B. Freida Busman, RD, LDN [phone] (pager)

## 2014-12-31 NOTE — Progress Notes (Signed)
MAJOR EVENTS/TEST RESULTS: 8/24 admitted with severe emphysema, acute exacerbation of COPD 8/26 transferred to ICU for NPPV 8/28 intubated for extreme dyspnea despite BiPAP 8/30 Tolerated PSV 20 cm for approx one hour. Still with very prolonged expiratory wheezes  INDWELLING DEVICES:: ETT 8/28 >>   MICRO DATA: MRSA PCR 8/26 >> NEG  ANTIMICROBIALS:  Azithromycin 8/24 >> 8/25 Doxycycline 8/25 >>    SUBJ: Sedated, no distress  Filed Vitals:   12/31/14 1300 12/31/14 1400 12/31/14 1500 12/31/14 1600  BP: 82/50 118/60 92/56 154/101  Pulse: 98 81 80 131  Temp:    98.7 F (37.1 C)  TempSrc:    Oral  Resp: Height:      Weight:      SpO2: 93% 96% 94% 92%   Intubated, sedated RASS -2, + F/C HEENT: missing all of upper teeth, poor lower dentition No JVD noted Prolonged diffuse exp wheezes Tachy, reg, no M Abd soft Ext warm, no edema  BMP Latest Ref Rng 12/30/2014 12/29/2014 12/28/2014  Glucose 65 - 99 mg/dL 147(W) 295(A) 213(Y)  BUN 6 - 20 mg/dL 86(V) 78(I) 69(G)  Creatinine 0.44 - 1.00 mg/dL 2.95 2.84 1.32(G)  Sodium 135 - 145 mmol/L 136 135 133(L)  Potassium 3.5 - 5.1 mmol/L 4.6 4.4 5.1  Chloride 101 - 111 mmol/L 102 98(L) 93(L)  CO2 22 - 32 mmol/L Calcium 8.9 - 10.3 mg/dL 4.0(N) 0.2(V) 9.6    CBC Latest Ref Rng 12/30/2014 12/29/2014 12/29/2014  WBC 3.6 - 11.0 K/uL 10.3 7.2 16.2(H)  Hemoglobin 12.0 - 16.0 g/dL 11.1(L) 10.9(L) 12.8  Hematocrit 35.0 - 47.0 % 33.0(L) 32.5(L) 39.0  Platelets 150 - 440 K/uL 174 174 288    CXR: NSC hyperinflation, no acute findings  IMPRESSION: Severe emphysema Severe acute on chronic respiratory failure AECOPD with acute bronchospasm Failed BiPAP - intubated 8/28 Sinus tachycardia - reactive Hypertension - reactive Hypotension after intubation/propofol - improved after NS bolus Mild hyponatremia Mild AKI, nonoliguric Undernourished Anxiety - controlled on propofol, PRN fentanyl    PLAN/REC: Cont full vent  support - settings reviewed and/or adjusted Cont vent bundle Daily SBT if/when meets criteria Cont nebulized steroids as ordered Cont bronchodilator regimen as ordered Cont systemic steroids dose at current dose DVT px: enoxaparin initiated 8/28 SUP: famotidine initiated 8/28 Monitor BMET intermittently Monitor I/Os Correct electrolytes as indicated Cont TFs  Cont sedation with propofol and fentanyl Pt will likely need trach tube and LTACH  CCM X 35 mins  Billy Fischer, MD PCCM service Mobile [phone] Pager [phone]

## 2014-12-31 NOTE — Progress Notes (Signed)
Assessments remain unchanged from previous charting, Dr Sharol Harness and Juliene Pina have made rounds and been updated, spouse has been updated by Dr Sharol Harness, pt tolerated 2 hrs on spontaneous ps, fentanyl drip has been titrated up for comfort. No ss of distress noted.

## 2014-12-31 NOTE — Progress Notes (Signed)
PHARMACY - CRITICAL CARE PROGRESS NOTE  Pharmacy Consult for Constipation Prevention    Allergies  Allergen Reactions  . Aspirin Swelling  . Levofloxacin Nausea Only  . Penicillins Hives    Patient Measurements: Height:  (170.2 cm) Weight: 147 lb (66.679 kg) IBW/kg (Calculated) : 61.6   Vital Signs: Temp: 98.7 [name] (37.1 C) (08/30 1600) Temp Source: Oral (08/30 1600) BP: 154/101 mmHg (08/30 1600) Pulse Rate: 131 (08/30 1600) Intake/Output from previous day: 08/29 0701 - 08/30 0700 In: 5767 [I.V.:3517; NG/GT:1000; IV Piggyback:1250] Out: 1425 [Urine:1425] Intake/Output from this shift: Total I/O In: 1985 [I.V.:1265; Other:300; NG/GT:420] Out: -  Vent settings for last 24 hours: Vent Mode:  [-] PRVC FiO2 (%):  [40 %] 40 % Set Rate:  [Diane bmp] Diane bmp Vt Set:  [500 mL] 500 mL PEEP:  [5 cmH20] 5 cmH20 Pressure Support:  [15 cmH20-20 cmH20] 15 cmH20   No data found.  No data found.     Labs:  Recent Labs  12/29/14 0522 12/29/14 1444 12/30/14 0428  WBC 16.2* 7.2 10.3  HGB 12.8 10.9* 11.1*  HCT 39.0 32.5* 33.0*  PLT 288 174 174  CREATININE  --  0.84 0.68   Estimated Creatinine Clearance: 74.5 mL/min (by C-G formula based on Cr of 0.68).   Recent Labs  12/31/14 0038 12/31/14 0730 12/31/14 1610  GLUCAP 146* 133* 133*    Microbiology: Recent Results (from the past 720 hour(s))  MRSA PCR Screening     Status: None   Collection Time: 12/27/14  7:Diane PM  Result Value Ref Range Status   MRSA by PCR NEGATIVE NEGATIVE Final    Comment:        The GeneXpert MRSA Assay (FDA approved for NASAL specimens only), is one component of a comprehensive MRSA colonization surveillance program. It is not intended to diagnose MRSA infection nor to guide or monitor treatment for MRSA infections.     Medications:  Scheduled:  . antiseptic oral rinse  7 mL Mouth Rinse q12n4p  . antiseptic oral rinse  7 mL Mouth Rinse QID  . budesonide  0.5 mg Nebulization  BID  . chlorhexidine gluconate  15 mL Mouth Rinse BID  . citalopram  40 mg Per Tube Daily  . doxycycline (VIBRAMYCIN) IV  100 mg Intravenous Q12H  . enoxaparin (LOVENOX) injection  40 mg Subcutaneous Q24H  . famotidine  20 mg Per Tube BID  . feeding supplement (VITAL HIGH PROTEIN)  1,000 mL Per Tube Q24H  . ipratropium-albuterol  3 mL Nebulization Q4H  . lactobacillus acidophilus  1 tablet Per Tube Daily  . methylPREDNISolone (SOLU-MEDROL) injection  40 mg Intravenous Q12H  . senna-docusate  1 tablet Oral BID  . sodium chloride  3 mL Intravenous Q12H   Infusions:  . sodium chloride 125 mL/hr at 12/31/14 0724  . fentaNYL infusion INTRAVENOUS 200 mcg/hr (12/31/14 1609)  . propofol (DIPRIVAN) infusion Stopped (12/31/14 0400)   PRN: acetaminophen **OR** acetaminophen, alum & mag hydroxide-simeth, fentaNYL (SUBLIMAZE) injection, hydrALAZINE, levalbuterol, LORazepam, metoprolol, midazolam, [DISCONTINUED] ondansetron **OR** ondansetron (ZOFRAN) IV  Assessment: 59 y/o [name] intubated, sedated with no BM charted to date.   Plan:  Will transition patient to senna/docusate bid.   Pharmacy will continue to monitor and adjust per consult.    Simpson,Michael L 12/31/2014,4:41 PM

## 2015-01-01 ENCOUNTER — Inpatient Hospital Stay: Payer: Managed Care, Other (non HMO)

## 2015-01-01 LAB — CBC
HEMATOCRIT: 31.7 % — AB (ref 35.0–47.0)
HEMOGLOBIN: 10.2 g/dL — AB (ref 12.0–16.0)
MCH: 29.9 pg (ref 26.0–34.0)
MCHC: 32.2 g/dL (ref 32.0–36.0)
MCV: 92.7 fL (ref 80.0–100.0)
Platelets: 166 10*3/uL (ref 150–440)
RBC: 3.42 MIL/uL — AB (ref 3.80–5.20)
RDW: 14 % (ref 11.5–14.5)
WBC: 10.2 10*3/uL (ref 3.6–11.0)

## 2015-01-01 LAB — BASIC METABOLIC PANEL
ANION GAP: 3 — AB (ref 5–15)
BUN: 31 mg/dL — AB (ref 6–20)
CALCIUM: 8.5 mg/dL — AB (ref 8.9–10.3)
CHLORIDE: 105 mmol/L (ref 101–111)
CO2: 31 mmol/L (ref 22–32)
CREATININE: 0.6 mg/dL (ref 0.44–1.00)
GFR calc Af Amer: >60 mL/min (ref >60)
GLUCOSE: 168 mg/dL — AB (ref 65–99)
POTASSIUM: 4.6 mmol/L (ref 3.5–5.1)
Sodium: 139 mmol/L (ref 135–145)

## 2015-01-01 LAB — GLUCOSE, CAPILLARY
GLUCOSE-CAPILLARY: 127 mg/dL — AB (ref 65–99)
GLUCOSE-CAPILLARY: 142 mg/dL — AB (ref 65–99)
Glucose-Capillary: 153 mg/dL — ABNORMAL HIGH (ref 65–99)
Glucose-Capillary: 149 mg/dL — ABNORMAL HIGH (ref 65–99)
Glucose-Capillary: 127 mg/dL — ABNORMAL HIGH (ref 65–99)

## 2015-01-01 MED ORDER — BUDESONIDE 0.25 MG/2ML IN SUSP
0.2500 mg | Freq: Four times a day (QID) | RESPIRATORY_TRACT | Status: DC
  Administered 2015-01-01 – 2015-01-02 (×4): 0.25 mg via RESPIRATORY_TRACT
  Filled 2015-01-01 (×4): qty 2

## 2015-01-01 MED ORDER — ALBUTEROL SULFATE (2.5 MG/3ML) 0.083% IN NEBU: 2.5000 mg | INHALATION_SOLUTION | RESPIRATORY_TRACT | Status: DC | PRN

## 2015-01-01 MED ORDER — CITALOPRAM HYDROBROMIDE 20 MG PO TABS
20.0000 mg | ORAL_TABLET | Freq: Every day | ORAL | Status: DC
  Administered 2015-01-01 – 2015-01-02 (×2): 20 mg
  Filled 2015-01-01 (×2): qty 1

## 2015-01-01 MED ORDER — IPRATROPIUM-ALBUTEROL 0.5-2.5 (3) MG/3ML IN SOLN
3.0000 mL | Freq: Four times a day (QID) | RESPIRATORY_TRACT | Status: DC
  Administered 2015-01-01 – 2015-01-02 (×4): 3 mL via RESPIRATORY_TRACT
  Filled 2015-01-01 (×4): qty 3

## 2015-01-01 MED ORDER — RISAQUAD PO CAPS
1.0000 | ORAL_CAPSULE | Freq: Every day | ORAL | Status: DC
  Administered 2015-01-01 – 2015-01-02 (×2): 1 via ORAL
  Filled 2015-01-01 (×2): qty 1

## 2015-01-01 NOTE — Progress Notes (Signed)
Nutrition Follow-up    INTERVENTION:  EN: Continue vital high protein at goal rate of 35ml/hr to meet nutritional needs   NUTRITION DIAGNOSIS:   Inadequate oral intake related to acute illness as evidenced by NPO status.    GOAL:   Provide needs based on ASPEN/SCCM guidelines    MONITOR:    (Energy intake, Digestive system)  REASON FOR ASSESSMENT:   Consult Enteral/tube feeding initiation and management  ASSESSMENT:      Pt remains on vent, possible trach placement   Current Nutrition: tolerating vital high protein at 84ml/hr   Gastrointestinal Profile:Residuals 50ml or less, abdomen soft, hypoactive bowel sounds Last BM: 8/25   Medications: NS at 145ml/hr, senokot, fentanyl  Electrolyte/Renal Profile and Glucose Profile:   Recent Labs Lab 12/29/14 1444 12/30/14 0428 01/01/15 0506  NA 135 136 139  K 4.4 4.6 4.6  CL 98* 102 105  CO2 BUN 39* 30* 31*  CREATININE 0.84 0.68 0.60  CALCIUM 8.7* 8.6* 8.5*  GLUCOSE 117* 139* 168*   Protein Profile:  Recent Labs Lab 12/28/14 0656  ALBUMIN 4.6      Weight Trend since Admission: Filed Weights   12/25/14 1147  Weight: 147 lb (66.679 kg)      Diet Order:   NPO  Skin:   reviewed   Height:   Ht Readings from Last 1 Encounters:  12/25/14  (1.702 m)    Weight:   Wt Readings from Last 1 Encounters:  12/25/14 147 lb (66.679 kg)       BMI:  Body mass index is 23.02 kg/(m^2).  Estimated Nutritional Needs:   Kcal:  TEE 1474 kcals/d (Ve 8.4, Tmax 37.1)  Protein:  (1.2-2.0 g/kg) 80-134 g/d  Fluid:  (25-45ml/kg) 1675-2036ml/d  EDUCATION NEEDS:   No education needs identified at this time  HIGH Care Level  Sephira Zellman B. Freida Busman, RD, LDN [phone] (pager)

## 2015-01-01 NOTE — Progress Notes (Signed)
MAJOR EVENTS/TEST RESULTS: 8/24 admitted with severe emphysema, acute exacerbation of COPD 8/26 transferred to ICU for NPPV 8/28 intubated for extreme dyspnea despite BiPAP 8/30 Tolerated PSV 20 cm for approx one hour. Still with very prolonged expiratory wheezes 8/31 Minimal improvement. ENT consultation requested for trach tube placement  INDWELLING DEVICES:: ETT 8/28 >>   MICRO DATA: MRSA PCR 8/26 >> NEG  ANTIMICROBIALS:  Azithromycin 8/24 >> 8/25 Doxycycline 8/25 >> 8/31   SUBJ: Sedated, no distress  Filed Vitals:   01/01/15 0900 01/01/15 1000 01/01/15 1100 01/01/15 1200  BP: 141/81 128/95 178/105 119/71  Pulse: 93 86 105 89  Temp:    99.2 F (37.3 C)  TempSrc:    Oral  Resp: Height:      Weight:      SpO2: 93% 94% 92% 94%   Intubated, sedated RASS -2, + F/C HEENT: missing all of upper teeth, poor lower dentition No JVD noted Diffuse, prolonged exp wheezes Tachy, reg, no M Abd soft Ext warm, no edema  BMP Latest Ref Rng 01/01/2015 12/30/2014 12/29/2014  Glucose 65 - 99 mg/dL 409(W) 119(J) 478(G)  BUN 6 - 20 mg/dL 95(A) 21(H) 08(M)  Creatinine 0.44 - 1.00 mg/dL 5.78 4.69 6.29  Sodium 135 - 145 mmol/L 139 136 135  Potassium 3.5 - 5.1 mmol/L 4.6 4.6 4.4  Chloride 101 - 111 mmol/L 105 102 98(L)  CO2 22 - 32 mmol/L Calcium 8.9 - 10.3 mg/dL 5.2(W) 4.1(L) 2.4(M)    CBC Latest Ref Rng 01/01/2015 12/30/2014 12/29/2014  WBC 3.6 - 11.0 K/uL 10.2 10.3 7.2  Hemoglobin 12.0 - 16.0 g/dL 10.2(L) 11.1(L) 10.9(L)  Hematocrit 35.0 - 47.0 % 31.7(L) 33.0(L) 32.5(L)  Platelets 150 - 440 K/uL 166 174 174    CXR: NSC hyperinflation, no acute findings  IMPRESSION: Severe emphysema Severe acute on chronic respiratory failure AECOPD with acute bronchospasm Sinus tachycardia - resolved Hypertension - controlled Hypotension after intubation/propofol - resolved Mild hyponatremia, resolved Mild AKI, nonoliguric, resolved Undernourished Anxiety -  controlled on propofol, PRN fentanyl    PLAN/REC: Cont vent support - settings reviewed and/or adjusted Wean in PSV as tolerated Cont vent bundle Daily SBT if/when meets criteria Cont nebulized steroids as ordered Cont bronchodilator regimen as ordered Cont systemic steroids dose at current dose DVT px: enoxaparin initiated 8/28 SUP: famotidine initiated 8/28 Monitor BMET intermittently Monitor I/Os Correct electrolytes as indicated Cont TFs  Cont sedation with propofol and fentanyl ENT called 8/31 for trach tube placement Plan on LTACH after trach tube placed Husband updated in detail @ bedside  CCM X 40 mins  Billy Fischer, MD PCCM service Mobile [phone] Pager [phone]
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
## 2015-01-01 NOTE — Progress Notes (Signed)
Pt remains on vent with fentanyl running at . vss. No signs or symptoms of distress. No significant events occured  during shift. Will continue to monitor.

## 2015-01-01 NOTE — Care Management Note (Signed)
Case Management Note  Patient Details  Name: Diane [name] MRN: [id number] Date of Birth: May 10, 1955  Subjective/Objective:    Therapist, occupational for [facility] received. Husband is in agreement. Awaiting trach. Case discussed with Dr. Sharol Harness and he wants patient to be transferred AFTER trach placement.                 Action/Plan:   Expected Discharge Date:                  Expected Discharge Plan:     In-House Referral:     Discharge planning Services     Post Acute Care Choice:    Choice offered to:     DME Arranged:    DME Agency:     HH Arranged:    HH Agency:     Status of Service:  In process, will continue to follow  Medicare Important Message Given:    Date Medicare IM Given:    Medicare IM give by:    Date Additional Medicare IM Given:    Additional Medicare Important Message give by:     If discussed at Long Length of Stay Meetings, dates discussed:    Additional Comments:  Marily Memos, RN 01/01/2015, 2:49 PM

## 2015-01-01 NOTE — Care Management Note (Signed)
Case Management Note  Patient Details  Name: Diane [name] MRN: [id number] Date of Birth: [birth year]  Subjective/Objective:  Vented with consult in place for trach placement.                  Action/Plan: Case discussed with Dr. Sharol Harness. Patient will be appropriate for [facility] s/p trach placement. Precert started for [facility]. Will discuss with spouse  Expected Discharge Date:                  Expected Discharge Plan:     In-House Referral:     Discharge planning Services     Post Acute Care Choice:    Choice offered to:     DME Arranged:    DME Agency:     HH Arranged:    HH Agency:     Status of Service:  In process, will continue to follow  Medicare Important Message Given:    Date Medicare IM Given:    Medicare IM give by:    Date Additional Medicare IM Given:    Additional Medicare Important Message give by:     If discussed at Long Length of Stay Meetings, dates discussed:    Additional Comments:  Marily Memos, RN 01/01/2015, 9:44 AM

## 2015-01-01 NOTE — Consult Note (Signed)
  Have looked at the schedule, and discussed the patient with Dr. Jenne Campus, who is in the main OR on Tuesday. Will schedule the patient for tracheostomy on Tuesday AM. Please transition the patient to shorter acting anticoagulation with Heparin in the interim, so that we will have adequate hemostasis on day of surgery.

## 2015-01-01 NOTE — Progress Notes (Addendum)
[facility] [place] [facility]   Diane [name] NAME: Diane [name]    MR#:  [id number]  DATE OF BIRTH:  01-22-1956  SUBJECTIVE:  Diane [name] is currently intubated on vent. REVIEW OF SYSTEMS:  Review of Systems  Unable to perform ROS  DRUG ALLERGIES:   Allergies  Allergen Reactions  . Aspirin Swelling  . Levofloxacin Nausea Only  . Penicillins Hives   VITALS:  Blood pressure 119/71, pulse 89, temperature 99.2 F (37.3 C), temperature source Oral, resp. rate 13, height 5\' 7"  (1.702 m), weight 66.679 kg (147 lb), SpO2 94 %. PHYSICAL EXAMINATION:  Physical Exam  Constitutional: Diane [name] appears not jaundiced. Diane [name] appears not cachectic. Diane [name] has a sickly appearance. No distress.  On vent  HENT:  Head: Normocephalic and atraumatic.  Eyes: Conjunctivae are normal.  Neck: Neck supple. No tracheal deviation present. No thyromegaly present.  Cardiovascular: Normal rate, regular rhythm and normal heart sounds.   Pulmonary/Chest: No accessory muscle usage or stridor. No respiratory distress. Diane [name] has decreased breath sounds. Diane [name] has no wheezes. Diane [name] has rhonchi. Diane [name] has no rales. Diane [name] exhibits no tenderness.  Abdominal: Soft. Bowel sounds are normal. Diane [name] exhibits no distension. There is no tenderness. There is no rebound and no guarding.  Musculoskeletal: Normal range of motion.  Neurological:  On vent does respond to some simple commands  Skin: Skin is warm and dry. No rash noted.   LABORATORY PANEL:   CBC  Recent Labs Lab 01/01/15 0506  WBC 10.2  HGB 10.2*  HCT 31.7*  PLT 166   ------------------------------------------------------------------------------------------------------------------ Chemistries   Recent Labs Lab 12/28/14 0656  01/01/15 0506  NA 133*  < > 139  K 5.1  < > 4.6  CL 93*  < > 105  CO2 25  < > 31  GLUCOSE 139*  < > 168*  BUN 35*  < > 31*  CREATININE 1.08*  < > 0.60  CALCIUM 9.6  < > 8.5*  AST 37  --   --   ALT 27  --   --    ALKPHOS 38  --   --   BILITOT 0.7  --   --   < > = values in this interval not displayed. RADIOLOGY:  Dg Chest Port 1 View  01/01/2015   CLINICAL DATA:  Follow-up of respiratory failure, history of COPD and previous tobacco use.  EXAM: PORTABLE CHEST - 1 VIEW  COMPARISON:  Portable chest x-ray of December 31, 2014  FINDINGS: The lungs are well-expanded and clear. The heart and pulmonary vascularity are normal. The mediastinum is normal in width. There is no pleural effusion or pneumothorax. The endotracheal tube tip lies 4.2 cm above the carina. The esophagogastric tube tip projects below the inferior margin of the image. There is chronic deformity of the midshaft of the right clavicle due to previous fracture.  IMPRESSION: COPD, stable. There is no pneumonia nor CHF. The support tubes are in reasonable position.   Electronically Signed   By: David  [place] M.D.   On: 01/01/2015 07:07   ASSESSMENT AND PLAN:  Diane [name] is a 59 y.o. female with a known history of COPD on 2 L home oxygen, hypertension depression and anxiety presents to the hospital secondary to worsening difficulty breathing.  1 Acute on chronic Respiratory Failure due to COPD exacerbation - with a component of vocal cord dysfunction -Diane [name] failed on BiPAP with no significant improvement and subsequently intubated. I spoke with intensivist this morning and plan is  to try to extubate however he feels the Diane [name] will not be able to be extubated and will need tracheostomy. ENT has been consulted for trach which is planned for early next week if Diane [name] does not come off the ventilator. Continue steroids and ongoing management as per pulmonology.   2 Hypotension-could be from adverse effect of propofol  Blood pressure has improved. Continue to monitor. Continue metoprolol when necessary  3 depression and anxiety-continue Celexa If Diane [name] will need trach Diane [name] will need LTAC placement.   4. Nutrition: Continue tube feeds as per  dietary consultation. Management plans discussed with the family and they are in agreement.  CODE STATUS: Full code  Diane [name] is at high risk for cardio-respiratory failure and death.  TOTAL TIME (Critical Care) TAKING CARE OF THIS Diane [name]: 35 minutes.   More than 50% of the time was spent in counseling/coordination of care: YES  POSSIBLE D/C ???, DEPENDING ON CLINICAL CONDITION   Lonnie Rosado M.D on 01/01/2015 at 1:11 PM  Between 7am to 6pm - Pager - [phone]  After 6pm go to [email or web address] - password [email or web address]  [facility]  Office  [phone]  CC:  Primary care physician; Sherlene Shams, MD

## 2015-01-01 NOTE — Progress Notes (Signed)
   01/01/15 1100  Clinical Encounter Type  Visited With Patient and family together  Visit Type Follow-up  Consult/Referral To Chaplain  Spiritual Encounters  Spiritual Needs Emotional  Stress Factors  Family Stress Factors None identified  Chaplain rounded in unit and offered a compassionate presence and support to family. Chaplain Daylin Gruszka A. [address] [place] Ext. [phone]

## 2015-01-02 ENCOUNTER — Inpatient Hospital Stay: Payer: Managed Care, Other (non HMO)

## 2015-01-02 ENCOUNTER — Other Ambulatory Visit (HOSPITAL_COMMUNITY): Payer: Self-pay

## 2015-01-02 ENCOUNTER — Ambulatory Visit (HOSPITAL_COMMUNITY)
Admission: AD | Admit: 2015-01-02 | Discharge: 2015-01-02 | Disposition: A | Discharge status: Home / Self Care | Payer: Managed Care, Other (non HMO) | Source: Other Acute Inpatient Hospital | Attending: Internal Medicine | Admitting: Internal Medicine

## 2015-01-02 ENCOUNTER — Inpatient Hospital Stay
Admission: AD | Admit: 2015-01-02 | Discharge: 2015-01-15 | Disposition: A | Discharge status: Skilled Nursing Facility | Payer: Managed Care, Other (non HMO) | Source: Other Acute Inpatient Hospital | Attending: Internal Medicine | Admitting: Internal Medicine

## 2015-01-02 DIAGNOSIS — J9690 Respiratory failure, unspecified, unspecified whether with hypoxia or hypercapnia: Secondary | ICD-10-CM | POA: Insufficient documentation

## 2015-01-02 DIAGNOSIS — R609 Edema, unspecified: Secondary | ICD-10-CM

## 2015-01-02 DIAGNOSIS — J449 Chronic obstructive pulmonary disease, unspecified: Secondary | ICD-10-CM | POA: Insufficient documentation

## 2015-01-02 DIAGNOSIS — Z0189 Encounter for other specified special examinations: Secondary | ICD-10-CM

## 2015-01-02 DIAGNOSIS — Z13810 Encounter for screening for upper gastrointestinal disorder: Secondary | ICD-10-CM

## 2015-01-02 LAB — BLOOD GAS, ARTERIAL
Acid-Base Excess: 12.4 mmol/L — ABNORMAL HIGH (ref 0.0–2.0)
BICARBONATE: 37.8 meq/L — AB (ref 20.0–24.0)
FIO2: 0.4
LHR: 14 {breaths}/min
O2 SAT: 93.4 %
PATIENT TEMPERATURE: 98.6
PCO2 ART: 63.4 mmHg — AB (ref 35.0–45.0)
PEEP: 5 cmH2O
PH ART: 7.393 (ref 7.350–7.450)
TCO2: 39.8 mmol/L (ref 0–100)
VT: 500 mL
pO2, Arterial: 69.4 mmHg — ABNORMAL LOW (ref 80.0–100.0)

## 2015-01-02 LAB — BASIC METABOLIC PANEL
Anion gap: 8 (ref 5–15)
BUN: 28 mg/dL — ABNORMAL HIGH (ref 6–20)
CALCIUM: 8.8 mg/dL — AB (ref 8.9–10.3)
CHLORIDE: 101 mmol/L (ref 101–111)
CO2: 34 mmol/L — AB (ref 22–32)
CREATININE: 0.57 mg/dL (ref 0.44–1.00)
GFR calc Af Amer: >60 mL/min (ref >60)
GFR calc non Af Amer: >60 mL/min (ref >60)
GLUCOSE: 157 mg/dL — AB (ref 65–99)
Potassium: 4.7 mmol/L (ref 3.5–5.1)
Sodium: 143 mmol/L (ref 135–145)

## 2015-01-02 LAB — PROTIME-INR
INR: 1.06
Prothrombin Time: 14 seconds (ref 11.4–15.0)

## 2015-01-02 LAB — CBC
HCT: 34.2 % — ABNORMAL LOW (ref 35.0–47.0)
Hemoglobin: 11 g/dL — ABNORMAL LOW (ref 12.0–16.0)
MCH: 29.9 pg (ref 26.0–34.0)
MCHC: 32.1 g/dL (ref 32.0–36.0)
MCV: 93.3 fL (ref 80.0–100.0)
Platelets: 217 10*3/uL (ref 150–440)
RBC: 3.66 MIL/uL — AB (ref 3.80–5.20)
RDW: 14.3 % (ref 11.5–14.5)
WBC: 14 10*3/uL — ABNORMAL HIGH (ref 3.6–11.0)

## 2015-01-02 LAB — GLUCOSE, CAPILLARY
Glucose-Capillary: 145 mg/dL — ABNORMAL HIGH (ref 65–99)
Glucose-Capillary: 140 mg/dL — ABNORMAL HIGH (ref 65–99)

## 2015-01-02 LAB — APTT: APTT: 24 s (ref 24–36)

## 2015-01-02 MED ORDER — METHYLPREDNISOLONE SODIUM SUCC 40 MG IJ SOLR: 40.0000 mg | Freq: Two times a day (BID) | INTRAMUSCULAR | Status: DC

## 2015-01-02 MED ORDER — BUDESONIDE 0.25 MG/2ML IN SUSP: 0.2500 mg | Freq: Four times a day (QID) | RESPIRATORY_TRACT | Status: DC

## 2015-01-02 MED ORDER — VITAL HIGH PROTEIN PO LIQD: 1000.0000 mL | ORAL | Status: DC

## 2015-01-02 NOTE — Progress Notes (Signed)
MAJOR EVENTS/TEST RESULTS: 8/24 admitted with severe emphysema, acute exacerbation of COPD 8/26 transferred to ICU for NPPV 8/28 intubated for extreme dyspnea despite BiPAP 8/30 Tolerated PSV 20 cm for approx one hour. Still with very prolonged expiratory wheezes 8/31 Minimal improvement. ENT consultation requested for trach tube placement 9/01 unable to get trach tube placed until 9/06. Transferred to [facility] in [place]. Dr Sung Amabile spoke with receiving MD@ [facility]  INDWELLING DEVICES:: ETT 8/28 >>   MICRO DATA: MRSA PCR 8/26 >> NEG  ANTIMICROBIALS:  Azithromycin 8/24 >> 8/25 Doxycycline 8/25 >> 8/31   SUBJ: Sedated, no distress  Filed Vitals:   01/02/15 0800 01/02/15 0900 01/02/15 1000 01/02/15 1100  BP: 103/58 109/56 103/54 146/77  Pulse: 84 85 83 98  Temp:      TempSrc:      Resp: Height:      Weight:      SpO2: 93% 94% 94% 94%   Intubated, sedated RASS 0, + F/C HEENT: missing all of upper teeth, poor lower dentition No JVD noted Diffuse, prolonged expiratory phase, no wheezes heard Tachy, reg, no M Abd soft Ext warm, no edema  BMP Latest Ref Rng 01/02/2015 01/01/2015 12/30/2014  Glucose 65 - 99 mg/dL 161(W) 960(A) 540(J)  BUN 6 - 20 mg/dL 81(X) 91(Y) 78(G)  Creatinine 0.44 - 1.00 mg/dL 9.56 2.13 0.86  Sodium 135 - 145 mmol/L 143 139 136  Potassium 3.5 - 5.1 mmol/L 4.7 4.6 4.6  Chloride 101 - 111 mmol/L 101 105 102  CO2 22 - 32 mmol/L 34(H) 31 30  Calcium 8.9 - 10.3 mg/dL 5.7(Q) 4.6(N) 6.2(X)    CBC Latest Ref Rng 01/02/2015 01/01/2015 12/30/2014  WBC 3.6 - 11.0 K/uL 14.0(H) 10.2 10.3  Hemoglobin 12.0 - 16.0 g/dL 11.0(L) 10.2(L) 11.1(L)  Hematocrit 35.0 - 47.0 % 34.2(L) 31.7(L) 33.0(L)  Platelets 150 - 440 K/uL 217 166 174    CXR: NSC hyperinflation, no acute findings  IMPRESSION: Severe emphysema Severe acute on chronic respiratory failure AECOPD with acute bronchospasm Sinus tachycardia - resolved Hypertension - controlled Hypotension after  intubation/propofol - resolved Mild hyponatremia, resolved Mild AKI, nonoliguric, resolved Undernourished Anxiety - controlled on propofol, PRN fentanyl    PLAN/REC: Cont vent support - settings reviewed and/or adjusted Wean in PSV as tolerated Cont vent bundle Daily SBT if/when meets criteria Cont nebulized steroids as ordered Cont bronchodilator regimen as ordered Cont systemic steroids dose at current dose - methylpred 40 mg IV q 12 hrs DVT px: enoxaparin initiated 8/28 SUP: famotidine initiated 8/28 Monitor BMET intermittently Monitor I/Os Correct electrolytes as indicated Cont TFs  Cont sedation with propofol and fentanyl ENT called 8/31 for trach tube placement Plan on LTACH after trach tube placed Husband updated in detail @ bedside   I approve transfer to [facility] in [place]. I have spoken with the receiving MD there and will ensure that [facility] PCCM sees pt inconsultation. She will need trach tube and possibly G-tube  CCM X 35 mins  Billy Fischer, MD PCCM service Mobile [phone] Pager [phone]

## 2015-01-02 NOTE — Care Management Note (Signed)
Case Management Note  Patient Details  Name: Diane [name] MRN: [id number] Date of Birth: 05-11-1955  Subjective/Objective:    Noted ENT scheduled trach for next Tuesday. Bed available now in [facility]. Private insurance approval received. If bed not accepted prior to Tuesday, high risk of loosing [employer] approval. Case discussed with Dr. Judithann Sheen, medical director. He will discuss with Dr. Bard Herbert for further feedback.                  Action/Plan:   Expected Discharge Date:                  Expected Discharge Plan:     In-House Referral:     Discharge planning Services     Post Acute Care Choice:    Choice offered to:     DME Arranged:    DME Agency:     HH Arranged:    HH Agency:     Status of Service:  In process, will continue to follow  Medicare Important Message Given:    Date Medicare IM Given:    Medicare IM give by:    Date Additional Medicare IM Given:    Additional Medicare Important Message give by:     If discussed at Long Length of Stay Meetings, dates discussed:    Additional Comments:  Marily Memos, RN 01/02/2015, 9:17 AM

## 2015-01-02 NOTE — Discharge Summary (Signed)
[facility] [place] at [facility]   PATIENT NAME: Diane [name]    MR#:  [id number]  DATE OF BIRTH:  1955-12-30  DATE OF ADMISSION:  12/25/2014 ADMITTING PHYSICIAN: Adrian Saran, MD  DATE OF DISCHARGE: 01/02/2015  PRIMARY CARE PHYSICIAN: Sherlene Shams, MD   ADMISSION DIAGNOSIS:  COPD with exacerbation [J44.1] DISCHARGE DIAGNOSIS:  Active Problems:   COPD (chronic obstructive pulmonary disease) Acute on chronic Respiratory Failure due to COPD exacerbation   Severe emphysema Severe acute on chronic respiratory failure AECOPD with acute bronchospasm Sinus tachycardia - resolved Hypertension - controlled Hypotension after intubation/propofol - resolved Mild hyponatremia, resolved Mild AKI, nonoliguric, resolved Undernourished Anxiety - controlled on propofol, PRN fentanyl SECONDARY DIAGNOSIS:   Past Medical History  Diagnosis Date  . COPD (chronic obstructive pulmonary disease)   . Hyperlipidemia   . Tobacco abuse   . Depression   . Anxiety   . Hypertension    HOSPITAL COURSE:  59 y.o. female with a known history of COPD on 2 L home oxygen, hypertension depression and anxiety presents to the hospital secondary to worsening difficulty breathing.  Please see Dr. Thomasena Edis dictated history and physical for further details.  She was admitted for acute on chronic hypoxic respiratory failure due to COPD exacerbation.  She was not moving much air and did not have much improvement in her breathing status despite aggressive treatment.  Pulmonary consultation was obtained with Dr. Sung Amabile.  MAJOR EVENTS/TEST RESULTS: 8/24 admitted with severe emphysema, acute exacerbation of COPD 8/26 transferred to ICU for NPPV 8/28 intubated for extreme dyspnea despite BiPAP 8/30 Tolerated PSV 20 cm for approx one hour. Still with very prolonged expiratory wheezes 8/31 Minimal improvement. ENT consultation requested for trach tube placement  At this point she  will likely need tracheostomy, after long discussion with care management, family, decision was made to transfer her to [facility], she has been accepted there and is being transferred in critical condition.  Patient's husband was updated in details at bedside by Dr. Sung Amabile and they are in agreement with transfer. DISCHARGE CONDITIONS:  Critical CONSULTS OBTAINED:  Treatment Team:  Shane Crutch, MD DRUG ALLERGIES:   Allergies  Allergen Reactions  . Aspirin Swelling  . Levofloxacin Nausea Only  . Penicillins Hives   DISCHARGE MEDICATIONS:   Current Discharge Medication List    START taking these medications   Details  azithromycin (ZITHROMAX Z-PAK) 250 MG tablet Take 2 tablets (500 mg) on  Day 1,  followed by 1 tablet (250 mg) once daily on Days 2 through 5. Qty: 6 each, Refills: 0    budesonide (PULMICORT) 0.25 MG/2ML nebulizer solution Take 2 mLs (0.25 mg total) by nebulization every 6 (six) hours. Qty: 60 mL, Refills: 12    Guaifenesin-Codeine 100-6.3 MG/5ML SOLN Take 5 mLs by mouth every 6 (six) hours as needed. Qty: 100 mL, Refills: 0    methylPREDNISolone sodium succinate (SOLU-MEDROL) 40 mg/mL injection Inject 1 mL (40 mg total) into the vein every 12 (twelve) hours. Qty: 1 each, Refills: 0    Nutritional Supplements (FEEDING SUPPLEMENT, VITAL HIGH PROTEIN,) LIQD liquid Place 1,000 mLs into feeding tube daily. Qty: 1000 mL, Refills: 1      CONTINUE these medications which have NOT CHANGED   Details  albuterol (PROVENTIL) (2.5 MG/3ML) 0.083% nebulizer solution USE ONE VIAL IN NEBULIZER EVERY 6 HOURS AS NEEDED FOR WHEEZING Qty: 180 mL, Refills: 3    albuterol (VENTOLIN HFA) 108 (90 BASE) MCG/ACT inhaler INHALE TWO PUFFS  BY MOUTH EVERY 4 HOURS AS NEEDED FOR WHEEZING OR SHORTNESS OF BREATH Qty: 18 each, Refills: 11    citalopram (CELEXA) 40 MG tablet Take 1 tablet (40 mg total) by mouth daily. Qty: 90 tablet, Refills: 0    clonazePAM (KLONOPIN) 0.5 MG tablet Take  0.5 mg by mouth 2 (two) times daily as needed for anxiety.    diphenhydramine-acetaminophen (TYLENOL PM) 25-500 MG TABS Take 2 tablets by mouth at bedtime as needed (for sleep).    Eszopiclone (ESZOPICLONE) 3 MG TABS Take 3 mg by mouth at bedtime.    fexofenadine (ALLEGRA) 180 MG tablet Take 1 tablet (180 mg total) by mouth daily. Qty: 90 tablet, Refills: 1    lactobacillus acidophilus (BACID) TABS tablet Take 1 tablet by mouth daily.    Lactulose 20 GM/30ML SOLN 30 ml by mouth as needed for severe constipation Qty: 236 mL, Refills: 2    losartan (COZAAR) 50 MG tablet Take 1 tablet (50 mg total) by mouth 2 (two) times daily. Qty: 60 tablet, Refills: 11   Associated Diagnoses: Visual changes; COPD exacerbation    Melatonin 5 MG TABS Take 5 mg by mouth at bedtime.    mometasone-formoterol (DULERA) 200-5 MCG/ACT AERO Inhale 2 puffs into the lungs 2 (two) times daily. Qty: 13 g, Refills: 11    montelukast (SINGULAIR) 10 MG tablet Take 1 tablet (10 mg total) by mouth at bedtime. Qty: 30 tablet, Refills: 5    ondansetron (ZOFRAN ODT) 4 MG disintegrating tablet Take 1 tablet (4 mg total) by mouth every 8 (eight) hours as needed for nausea or vomiting. Qty: 20 tablet, Refills: 0    roflumilast (DALIRESP) 500 MCG TABS tablet Take 1 tablet (500 mcg total) by mouth daily. Qty: 30 tablet, Refills: 5    Tiotropium Bromide Monohydrate (SPIRIVA RESPIMAT) 2.5 MCG/ACT AERS Inhale 2 puffs into the lungs daily.      STOP taking these medications     benzonatate (TESSALON) 200 MG capsule        DISCHARGE INSTRUCTIONS:   DIET:  Tube Feeding DISCHARGE CONDITION:  Critical ACTIVITY:  Bedrest OXYGEN:  Home Oxygen: Yes.    Oxygen Delivery: Depending on her clinical condition at discharge from LTAC DISCHARGE LOCATION:  LTAC   If you experience worsening of your admission symptoms, develop shortness of breath, life threatening emergency, suicidal or homicidal thoughts you must seek  medical attention immediately by calling 911 or calling your MD immediately  if symptoms less severe.  You Must read complete instructions/literature along with all the possible adverse reactions/side effects for all the Medicines you take and that have been prescribed to you. Take any new Medicines after you have completely understood and accpet all the possible adverse reactions/side effects.   Please note  You were cared for by a hospitalist during your hospital stay. If you have any questions about your discharge medications or the care you received while you were in the hospital after you are discharged, you can call the unit and asked to speak with the hospitalist on call if the hospitalist that took care of you is not available. Once you are discharged, your primary care physician will handle any further medical issues. Please note that NO REFILLS for any discharge medications will be authorized once you are discharged, as it is imperative that you return to your primary care physician (or establish a relationship with a primary care physician if you do not have one) for your aftercare needs so that they can reassess  your need for medications and monitor your lab values.    On the day of Discharge: VITAL SIGNS:  Blood pressure 146/77, pulse 98, temperature 100.5 F (38.1 C), temperature source Axillary, resp. rate 18, height 5\' 7"  (1.702 m), weight 66.679 kg (147 lb), SpO2 94 %. I/O:   Intake/Output Summary (Last 24 hours) at 01/02/15 1140 Last data filed at 01/02/15 1000  Gross per 24 hour  Intake   1726 ml  Output   2065 ml  Net   -339 ml   PHYSICAL EXAMINATION:  GENERAL:  59 y.o.-year-old patient lying in the bed with no acute distress.  EYES: Pupils equal, round, reactive to light and accommodation. No scleral icterus. Extraocular muscles intact.  HEENT: Head atraumatic, normocephalic. missing all of upper teeth, poor lower dentition. Endotracheal tube in place. NECK:  Supple,  no jugular venous distention. No thyroid enlargement, no tenderness.  LUNGS: Endotracheal tube in place, Diffuse, prolonged exp wheezes CARDIOVASCULAR: S1, S2 normal. No murmurs, rubs, or gallops.  ABDOMEN: Soft, non-tender, non-distended. Bowel sounds present. No organomegaly or mass.  EXTREMITIES: No pedal edema, cyanosis, or clubbing.  NEUROLOGIC: Intubated and sedated  PSYCHIATRIC: Sedated SKIN: No obvious rash, lesion, or ulcer.  DATA REVIEW:   CBC  Recent Labs Lab 01/02/15 0432  WBC 14.0*  HGB 11.0*  HCT 34.2*  PLT 217    Chemistries   Recent Labs Lab 12/28/14 0656  01/02/15 0432  NA 133*  < > 143  K 5.1  < > 4.7  CL 93*  < > 101  CO2 25  < > 34*  GLUCOSE 139*  < > 157*  BUN 35*  < > 28*  CREATININE 1.08*  < > 0.57  CALCIUM 9.6  < > 8.8*  AST 37  --   --   ALT 27  --   --   ALKPHOS 38  --   --   BILITOT 0.7  --   --   < > = values in this interval not displayed.  Microbiology Results  Results for orders placed or performed during the hospital encounter of 12/25/14  MRSA PCR Screening     Status: None   Collection Time: 12/27/14  7:14 PM  Result Value Ref Range Status   MRSA by PCR NEGATIVE NEGATIVE Final    Comment:        The GeneXpert MRSA Assay (FDA approved for NASAL specimens only), is one component of a comprehensive MRSA colonization surveillance program. It is not intended to diagnose MRSA infection nor to guide or monitor treatment for MRSA infections.     RADIOLOGY:  Dg Chest Port 1 View  01/02/2015   CLINICAL DATA:  Respiratory failure, history of COPD and previous tobacco use, intubated patient.  EXAM: PORTABLE CHEST - 1 VIEW  COMPARISON:  Portable chest x-ray of January 01, 2015  FINDINGS: The lungs remain hyperinflated and clear. The heart and pulmonary vascularity are normal. There is no significant pleural effusion. There is no pneumothorax. The endotracheal tube tip lies 3.8 cm above the carina. The esophagogastric tube tip projects  below the inferior margin of the image.  IMPRESSION: COPD. The endotracheal tube is in reasonable position. There is no pneumonia, CHF, nor other acute cardiopulmonary abnormality.   Electronically Signed   By: David  [place] M.D.   On: 01/02/2015 07:44   Dg Chest Port 1 View  01/01/2015   CLINICAL DATA:  Follow-up of respiratory failure, history of COPD and previous tobacco use.  EXAM:  PORTABLE CHEST - 1 VIEW  COMPARISON:  Portable chest x-ray of December 31, 2014  FINDINGS: The lungs are well-expanded and clear. The heart and pulmonary vascularity are normal. The mediastinum is normal in width. There is no pleural effusion or pneumothorax. The endotracheal tube tip lies 4.2 cm above the carina. The esophagogastric tube tip projects below the inferior margin of the image. There is chronic deformity of the midshaft of the right clavicle due to previous fracture.  IMPRESSION: COPD, stable. There is no pneumonia nor CHF. The support tubes are in reasonable position.   Electronically Signed   By: David  [place] M.D.   On: 01/01/2015 07:07   Management plans discussed with the patient, family and they are in agreement.  CODE STATUS: Full code  TOTAL TIME (CRITICAL CARE) TAKING CARE OF THIS PATIENT: 55 minutes.    [facility], Morgann Woodburn M.D on 01/02/2015 at 11:40 AM  Between 7am to 6pm - Pager - [phone]  After 6pm go to [email or web address] - password [email or web address] [facility]  [facility]  Office  [phone]  CC: Primary care physician; Sherlene Shams, MD Merwyn Katos, MD

## 2015-01-02 NOTE — Care Management Note (Signed)
Case Management Note  Patient Details  Name: Diane [name] MRN: [id number] Date of Birth: 12/12/1955  Subjective/Objective:    Transferring to [facility] today. Spouse made aware and agreeable. Dr. Sherryll Burger updated. Primary nurse given room number [phone] at [facility] with number to call report [phone]. Primary nurse will contact [facility]. Transfer packet ready with Diplomatic Services operational officer              Action/Plan:   Expected Discharge Date:                  Expected Discharge Plan:     In-House Referral:     Discharge planning Services     Post Acute Care Choice:    Choice offered to:     DME Arranged:    DME Agency:     HH Arranged:    HH Agency:     Status of Service:  In process, will continue to follow  Medicare Important Message Given:    Date Medicare IM Given:    Medicare IM give by:    Date Additional Medicare IM Given:    Additional Medicare Important Message give by:     If discussed at Long Length of Stay Meetings, dates discussed:    Additional Comments:  Marily Memos, RN 01/02/2015, 10:57 AM

## 2015-01-03 DIAGNOSIS — J9601 Acute respiratory failure with hypoxia: Secondary | ICD-10-CM

## 2015-01-03 DIAGNOSIS — J441 Chronic obstructive pulmonary disease with (acute) exacerbation: Secondary | ICD-10-CM

## 2015-01-03 DIAGNOSIS — J189 Pneumonia, unspecified organism: Secondary | ICD-10-CM

## 2015-01-03 LAB — BLOOD GAS, ARTERIAL
ACID-BASE EXCESS: 12.8 mmol/L — AB (ref 0.0–2.0)
Acid-Base Excess: 10.9 mmol/L — ABNORMAL HIGH (ref 0.0–2.0)
BICARBONATE: 36.8 meq/L — AB (ref 20.0–24.0)
Bicarbonate: 34.9 mEq/L — ABNORMAL HIGH (ref 20.0–24.0)
Delivery systems: POSITIVE
EXPIRATORY PAP: 5
FIO2: 0.4
FIO2: 0.4
Inspiratory PAP: 12
O2 SAT: 98.7 %
O2 Saturation: 97 %
PATIENT TEMPERATURE: 100
PCO2 ART: 47.2 mmHg — AB (ref 35.0–45.0)
PEEP/CPAP: 5 cmH2O
PH ART: 7.507 — AB (ref 7.350–7.450)
Patient temperature: 98.6
Pressure support: 5 cmH2O
TCO2: 36.3 mmol/L (ref 0–100)
TCO2: 38.2 mmol/L (ref 0–100)
pCO2 arterial: 45.4 mmHg — ABNORMAL HIGH (ref 35.0–45.0)
pH, Arterial: 7.497 — ABNORMAL HIGH (ref 7.350–7.450)
pO2, Arterial: 90.4 mmHg (ref 80.0–100.0)
pO2, Arterial: 131 mmHg — ABNORMAL HIGH (ref 80.0–100.0)

## 2015-01-03 MED FILL — Midazolam HCl Inj 5 MG/5ML (Base Equivalent): INTRAMUSCULAR | Qty: 5 | Status: AC

## 2015-01-03 NOTE — Consult Note (Signed)
Name: SCHERRIE SENECA MRN: [id number] DOB: 05/25/1955    ADMISSION DATE:  01/02/2015 CONSULTATION DATE:  01/03/15  REFERRING MD :  Dr. Sharyon Medicus  CHIEF COMPLAINT:  VDRF   BRIEF PATIENT DESCRIPTION: 59 y/o F, smoker, with PMH of HTN, HLD, Depression / Anxiety & 2L O2 dependent COPD who presented to [facility] on 8/24 with an AECOPD.  She failed NPPV and was intubated on 8/28 for respiratory distress.  Further, transferred to [facility] in [place] for ventilator weaning.    SIGNIFICANT EVENTS  8/24 - 9/01  Admit to [facility] for AECOPD, intubated 9/01  Admit to [facility] 9/02  PCCM consulted for evaluation  STUDIES:    HISTORY OF PRESENT ILLNESS:  59 y/o F, smoker, with PMH of HTN, HLD, Depression / Anxiety & 2L O2 dependent COPD who presented to [facility] on 8/24 with an AECOPD.   The family report she was still smoking up until admission (smokes 1ppd since age 76).  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  She was seen in the office by Dr. Sung Amabile at [facility] several weeks prior to 8/24 admit.  The patient was intubated with an acute exacerbation of COPD on 8/24.  She was transferred to the ICU for BiPAP support.  She remained on BiPAP until 8/28 at which time she was intubated for worsening respiratory distress.  She made little progress with weaned and had documented bronchospasm on exam.  Tracheostomy was discussed on 8/31 and patient was ultimately transferred to [facility] on 9/1 for further weaning / possible trach.  PCCM consulted for evaluation 9/2 of respiratory status.    PAST MEDICAL HISTORY :   has a past medical history of COPD (chronic obstructive pulmonary disease); Hyperlipidemia; Tobacco abuse; Depression; Anxiety; and Hypertension.  has past surgical history that includes Tubal ligation and Fracture surgery (2007).   Prior to Admission medications   Medication Sig Start Date End Date Taking? Authorizing Provider  albuterol (PROVENTIL)  (2.5 MG/3ML) 0.083% nebulizer solution USE ONE VIAL IN NEBULIZER EVERY 6 HOURS AS NEEDED FOR WHEEZING Patient taking differently: Take 2.5 mg by nebulization every 6 (six) hours as needed for wheezing or shortness of breath.  12/05/14   Sherlene Shams, MD  albuterol (VENTOLIN HFA) 108 (90 BASE) MCG/ACT inhaler INHALE TWO PUFFS BY MOUTH EVERY 4 HOURS AS NEEDED FOR WHEEZING OR SHORTNESS OF BREATH Patient taking differently: Inhale 2 puffs into the lungs every 4 (four) hours as needed for wheezing or shortness of breath.  11/20/14   Oretha Milch, MD  azithromycin (ZITHROMAX Z-PAK) 250 MG tablet Take 2 tablets (500 mg) on  Day 1,  followed by 1 tablet (250 mg) once daily on Days 2 through 5. 12/25/14   Sharman Cheek, MD  budesonide (PULMICORT) 0.25 MG/2ML nebulizer solution Take 2 mLs (0.25 mg total) by nebulization every 6 (six) hours. 01/02/15   Delfino Lovett, MD  citalopram (CELEXA) 40 MG tablet Take 1 tablet (40 mg total) by mouth daily. 05/06/14   Sherlene Shams, MD  clonazePAM (KLONOPIN) 0.5 MG tablet Take 0.5 mg by mouth 2 (two) times daily as needed for anxiety.    Historical Provider, MD  diphenhydramine-acetaminophen (TYLENOL PM) 25-500 MG TABS Take 2 tablets by mouth at bedtime as needed (for sleep).    Historical Provider, MD  Eszopiclone (ESZOPICLONE) 3 MG TABS Take 3 mg by mouth at bedtime.    Historical Provider, MD  fexofenadine (ALLEGRA) 180 MG tablet Take 1 tablet (180 mg total) by mouth daily. 08/10/13   Sherlene Shams, MD  Guaifenesin-Codeine 100-6.3 MG/5ML SOLN Take 5 mLs by mouth every 6 (six) hours as needed. 12/25/14   Sharman Cheek, MD  lactobacillus acidophilus (BACID) TABS tablet Take 1 tablet by mouth daily.    Historical Provider, MD  Lactulose 20 GM/30ML SOLN 30 ml by mouth as needed for severe constipation Patient taking differently: Take 30 mLs by mouth as needed (for severe constipation).  09/02/14   Sherlene Shams, MD  losartan (COZAAR) 50 MG tablet Take 1 tablet (50 mg total) by  mouth 2 (two) times daily. 09/12/14 09/12/15  Lupita Leash, MD  Melatonin 5 MG TABS Take 5 mg by mouth at bedtime.    Historical Provider, MD  methylPREDNISolone sodium succinate (SOLU-MEDROL) 40 mg/mL injection Inject 1 mL (40 mg total) into the vein every 12 (twelve) hours. 01/02/15   Delfino Lovett, MD  mometasone-formoterol (DULERA) 200-5 MCG/ACT AERO Inhale 2 puffs into the lungs 2 (two) times daily. 02/08/14   Sherlene Shams, MD  montelukast (SINGULAIR) 10 MG tablet Take 1 tablet (10 mg total) by mouth at bedtime. 09/11/14   Sherlene Shams, MD  Nutritional Supplements (FEEDING SUPPLEMENT, VITAL HIGH PROTEIN,) LIQD liquid Place 1,000 mLs into feeding tube daily. 01/02/15   Vipul Sherryll Burger, MD  ondansetron (ZOFRAN ODT) 4 MG disintegrating tablet Take 1 tablet (4 mg total) by mouth every 8 (eight) hours as needed for nausea or vomiting. 08/12/14   Sherlene Shams, MD  roflumilast (DALIRESP) 500 MCG TABS tablet Take 1 tablet (500 mcg total) by mouth daily. 07/23/14   Lupita Leash, MD  Tiotropium Bromide Monohydrate (SPIRIVA RESPIMAT) 2.5 MCG/ACT AERS Inhale 2 puffs into the lungs daily.    Historical Provider, MD   Allergies  Allergen Reactions  . Aspirin Swelling  . Levofloxacin Nausea Only  . Penicillins Hives    FAMILY HISTORY:  family history includes Hypertension in her father and mother; Stroke in her father.   SOCIAL HISTORY:  reports that she quit smoking about 13 months ago. Her smoking use included Cigarettes. She has a 4 pack-year smoking history. She has never used smokeless tobacco. She reports that she does not drink alcohol or use illicit drugs.  REVIEW OF SYSTEMS:  Unable to complete as patient is on mechanical ventilation.  Information obtained from family at bedside, prior medical documentation & staff.    SUBJECTIVE:   VITAL SIGNS: Temp:  [100.2 F (37.9 C)] 100.2 F (37.9 C) (09/01 1337) Pulse Rate:  [87-90] 87 (09/01 1337) Resp:  [13] 13 (09/01 1337) BP:  (106-125)/(64-70) 125/70 mmHg (09/01 1337) SpO2:  [94 %] 94 % (09/01 1337)  PHYSICAL EXAMINATION: General:  Chronically ill adult female in NAD  Neuro:  Alert, nods appropriately, generalized weakness but MAE HEENT:  MM pink/moist, OETT, no jvd Cardiovascular:  s1s2 rrr, no m/r/g Lungs:  resp's even/non-labored, lungs bilaterally clear, no wheeze Abdomen:  Obese/soft, bsx4 active, tol TF Musculoskeletal:  No acute deformities  Skin:  Warm/dry, BUE edema, none lower   Recent Labs Lab 12/30/14 0428 01/01/15 0506 01/02/15 0432  NA 136 139 143  K 4.6 4.6 4.7  CL 102 105 101  CO2 30 31 34*  BUN 30* 31* 28*  CREATININE 0.68 0.60 0.57  GLUCOSE 139* 168* 157*    Recent Labs Lab 12/30/14 0428 01/01/15 0506 01/02/15 0432  HGB 11.1* 10.2* 11.0*  HCT 33.0* 31.7*  34.2*  WBC 10.3 10.2 14.0*  PLT 174 166 217   Dg Chest Port 1 View  01/02/2015   CLINICAL DATA:  Respiratory failure, history of COPD and previous tobacco use, intubated patient.  EXAM: PORTABLE CHEST - 1 VIEW  COMPARISON:  Portable chest x-ray of January 01, 2015  FINDINGS: The lungs remain hyperinflated and clear. The heart and pulmonary vascularity are normal. There is no significant pleural effusion. There is no pneumothorax. The endotracheal tube tip lies 3.8 cm above the carina. The esophagogastric tube tip projects below the inferior margin of the image.  IMPRESSION: COPD. The endotracheal tube is in reasonable position. There is no pneumonia, CHF, nor other acute cardiopulmonary abnormality.   Electronically Signed   By: David  [place] M.D.   On: 01/02/2015 07:44   Dg Abd Portable 1v  01/03/2015   CLINICAL DATA:  Assess orogastric tube placement. Initial encounter.  EXAM: PORTABLE ABDOMEN - 1 VIEW  COMPARISON:  Abdominal radiograph performed 12/29/2014  FINDINGS: The patient's enteric tube is seen ending overlying the body of the stomach. The stomach is partially filled with air, and is grossly unremarkable.  The visualized  bowel gas pattern is within normal limits. No free intra-abdominal air is seen, though evaluation for free air is limited on a single supine view. No acute osseous abnormalities are identified.  IMPRESSION: Enteric tube seen ending overlying the body of the stomach.   Electronically Signed   By: Roanna Raider M.D.   On: 01/03/2015 01:01    ASSESSMENT / PLAN:  OETT 8/28 >>    Ventilator Dependent Respiratory Failure - in the setting of AECOPD.  First ever intubation 8/28  O2 Dependent COPD with Acute Exacerbation  Seasonal Allergies   Plan: Continue MV support, PSV wean 5/5 x 30 minutes with ABG Pending ABG results, may be able to extubate.  Will plan for extubation if meets criteria to BiPAP given severity of COPD Begin vent weaning protocol, may require medication for anxiety Azithromycin per primary MD Continue Daliresp, Breo, Singulair, guaifenesin, budesonide, loratadine Duonebs + PRN albuterol Solumedrol 40 mg BID, will need slow taper to off  Intermittent CXR  Lasix 40 mg BID x2 doses with KCL x1 Push PT efforts, family reports bed / chair bound prior to admit, limited activity  Consider outpatient PFT's    All other medical issues per [facility] MD.   Canary Brim, NP-C [place] [facility] Pgr: [phone] or if no answer [phone] 01/03/2015, 12:54 PM  Attending Note:  59 year old female with PMH of COPD, first intubation for respiratory failure.  CXR reviewed and showed pulmonary edema as well hyperinflation. She is currently intubated and weaning.  When dropped to 0/0 on the vent was able to move 700 ml tidal volume.  Will check a gas on 5/5 and will extubate.  If fails then will extubate and trach next week.  The patient is critically ill with multiple organ systems failure and requires high complexity decision making for assessment and support, frequent evaluation and titration of therapies, application of advanced monitoring technologies and extensive  interpretation of multiple databases.   Critical Care Time devoted to patient care services described in this note is  35  Minutes. This time reflects time of care of this signee Dr Koren Bound. This critical care time does not reflect procedure time, or teaching time or supervisory time of PA/NP/Med student/Med Resident etc but could involve care discussion time.  Alyson Reedy, M.D. [facility] Pulmonary/Critical Care  Medicine. Pager: [phone]. After hours pager: [phone].

## 2015-01-04 ENCOUNTER — Institutional Professional Consult (permissible substitution) (HOSPITAL_COMMUNITY): Payer: Self-pay

## 2015-01-07 ENCOUNTER — Other Ambulatory Visit (HOSPITAL_COMMUNITY): Payer: Self-pay

## 2015-01-07 ENCOUNTER — Encounter: Admission: RE | Payer: Self-pay | Source: Ambulatory Visit

## 2015-01-07 ENCOUNTER — Ambulatory Visit
Admission: RE | Admit: 2015-01-07 | Payer: Managed Care, Other (non HMO) | Source: Ambulatory Visit | Admitting: Ophthalmology

## 2015-01-07 ENCOUNTER — Encounter
Admission: AD | Disposition: A | Discharge status: Skilled Nursing Facility | Payer: Self-pay | Attending: Internal Medicine

## 2015-01-07 ENCOUNTER — Institutional Professional Consult (permissible substitution) (HOSPITAL_COMMUNITY): Payer: Self-pay

## 2015-01-07 DIAGNOSIS — J9620 Acute and chronic respiratory failure, unspecified whether with hypoxia or hypercapnia: Secondary | ICD-10-CM

## 2015-01-07 DIAGNOSIS — J9690 Respiratory failure, unspecified, unspecified whether with hypoxia or hypercapnia: Secondary | ICD-10-CM | POA: Insufficient documentation

## 2015-01-07 SURGERY — CREATION, TRACHEOSTOMY
Anesthesia: Choice

## 2015-01-07 SURGERY — PHACOEMULSIFICATION, CATARACT, WITH IOL INSERTION
Anesthesia: Choice | Laterality: Right

## 2015-01-07 NOTE — Progress Notes (Signed)
   Name: Diane [name] MRN: [id number] DOB: [birth year]    ADMISSION DATE:  01/02/2015 CONSULTATION DATE:  01/03/15  REFERRING MD :  Dr. Sharyon Medicus  CHIEF COMPLAINT:  VDRF   BRIEF PATIENT DESCRIPTION: 59 y/o F, smoker, with PMH of HTN, HLD, Depression / Anxiety & 2L O2 dependent COPD who presented to [facility] on 8/24 with an AECOPD.  She failed NPPV and was intubated on 8/28 for respiratory distress.  Further, transferred to [facility] in [place] for ventilator weaning.    SIGNIFICANT EVENTS  8/24 - 9/01  Admit to [facility] for AECOPD, intubated 9/01  Admit to [facility] 9/02  PCCM consulted for evaluation and extubated 9/2  STUDIES:    HISTORY OF PRESENT ILLNESS:  59 y/o F, smoker, with PMH of HTN, HLD, Depression / Anxiety & 2L O2 dependent COPD who presented to [facility] on 8/24 with an AECOPD.   The family report she was still smoking up until admission (smokes 1ppd since age 59).  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  She was seen in the office by Dr. Sung Amabile at [facility] several weeks prior to 8/24 admit.  The patient was intubated with an acute exacerbation of COPD on 8/24.  She was transferred to the ICU for BiPAP support.  She remained on BiPAP until 8/28 at which time she was intubated for worsening respiratory distress.  She made little progress with weaned and had documented bronchospasm on exam.  Tracheostomy was discussed on 8/31 and patient was ultimately transferred to [facility] on 9/1 for further weaning / possible trach.  PCCM consulted for evaluation 9/2 of respiratory status.     SUBJECTIVE:  NAD at rest  VITAL SIGNS: 140/90 95% on 15 l/m [place]  94 sr afebrile  rr 17 PHYSICAL EXAMINATION: General:  Chronically ill adult female in NAD.itting in chair Neuro:  Alert, intact. Hoarse from recent intubation HEENT:  MM pink/moist,  Cardiovascular:  s1s2 rrr, no m/r/g Lungs:  resp's even/non-labored, lungs bilaterally clear, no  wheeze Abdomen:  soft, bsx4 active, tol TF Musculoskeletal:  No acute deformities  Skin:  Warm/dry, BUE edema, none lower   Recent Labs Lab 01/01/15 0506 01/02/15 0432  NA 139 143  K 4.6 4.7  CL 105 101  CO2 31 34*  BUN 31* 28*  CREATININE 0.60 0.57  GLUCOSE 168* 157*    Recent Labs Lab 01/01/15 0506 01/02/15 0432  HGB 10.2* 11.0*  HCT 31.7* 34.2*  WBC 10.2 14.0*  PLT 166 217   No results found.  ASSESSMENT / PLAN:  OETT 8/28 >> 9/2   Ventilator Dependent Respiratory Failure - in the setting of AECOPD.  First ever intubation 8/28  O2 Dependent COPD with Acute Exacerbation . Extubated 9/2 and remains on high flow O2 at 15 l/m   Plan: Continue O2/steroids/abx Nocturnal NIMVS Continue Daliresp, Breo, Singulair, guaifenesin, budesonide, loratadine Duonebs + PRN albuterol Solumedrol 40 mg BID, will need slow taper to off  Intermittent CXR  Lasix as needed Push PT efforts, family reports bed / chair bound prior to admit, limited activity  Consider outpatient PFT's to establish COPD staging. I suspect COPD may be near end stage.   All other medical issues per [facility] MD.   Brett Canales Renise Gillies ACNP [facility] PCCM Pager [phone] till 3 pm If no answer page [phone] 01/07/2015, 11:16 AM

## 2015-01-08 ENCOUNTER — Other Ambulatory Visit (HOSPITAL_COMMUNITY): Payer: Managed Care, Other (non HMO)

## 2015-01-08 LAB — URINALYSIS, ROUTINE W REFLEX MICROSCOPIC
Bilirubin Urine: NEGATIVE
Glucose, UA: NEGATIVE mg/dL
KETONES UR: NEGATIVE mg/dL
NITRITE: NEGATIVE
PH: 6.5 (ref 5.0–8.0)
Protein, ur: 100 mg/dL — AB
SPECIFIC GRAVITY, URINE: 1.022 (ref 1.005–1.030)
Urobilinogen, UA: 0.2 mg/dL (ref 0.0–1.0)

## 2015-01-08 LAB — URINE MICROSCOPIC-ADD ON

## 2015-01-10 LAB — URINE CULTURE

## 2015-01-11 LAB — CBC
HEMATOCRIT: 34.9 % — AB (ref 36.0–46.0)
HEMOGLOBIN: 11.4 g/dL — AB (ref 12.0–15.0)
MCH: 29.7 pg (ref 26.0–34.0)
MCHC: 32.7 g/dL (ref 30.0–36.0)
MCV: 90.9 fL (ref 78.0–100.0)
Platelets: 259 10*3/uL (ref 150–400)
RBC: 3.84 MIL/uL — ABNORMAL LOW (ref 3.87–5.11)
RDW: 13.5 % (ref 11.5–15.5)
WBC: 14.9 10*3/uL — ABNORMAL HIGH (ref 4.0–10.5)

## 2015-01-11 LAB — RENAL FUNCTION PANEL
ALBUMIN: 2.9 g/dL — AB (ref 3.5–5.0)
ANION GAP: 7 (ref 5–15)
BUN: 18 mg/dL (ref 6–20)
CO2: 28 mmol/L (ref 22–32)
Calcium: 8.8 mg/dL — ABNORMAL LOW (ref 8.9–10.3)
Chloride: 102 mmol/L (ref 101–111)
Creatinine, Ser: 0.61 mg/dL (ref 0.44–1.00)
GFR calc Af Amer: >60 mL/min (ref >60)
GFR calc non Af Amer: >60 mL/min (ref >60)
GLUCOSE: 124 mg/dL — AB (ref 65–99)
PHOSPHORUS: 3.9 mg/dL (ref 2.5–4.6)
POTASSIUM: 4.1 mmol/L (ref 3.5–5.1)
Sodium: 137 mmol/L (ref 135–145)

## 2015-01-12 LAB — URINALYSIS, ROUTINE W REFLEX MICROSCOPIC
BILIRUBIN URINE: NEGATIVE
GLUCOSE, UA: NEGATIVE mg/dL
Ketones, ur: 15 mg/dL — AB
NITRITE: NEGATIVE
PH: 6.5 (ref 5.0–8.0)
Protein, ur: >300 mg/dL — AB
SPECIFIC GRAVITY, URINE: 1.026 (ref 1.005–1.030)
Urobilinogen, UA: 0.2 mg/dL (ref 0.0–1.0)

## 2015-01-12 LAB — URINE MICROSCOPIC-ADD ON

## 2015-01-13 LAB — BASIC METABOLIC PANEL
ANION GAP: 7 (ref 5–15)
BUN: 22 mg/dL — ABNORMAL HIGH (ref 6–20)
CO2: 26 mmol/L (ref 22–32)
Calcium: 8.7 mg/dL — ABNORMAL LOW (ref 8.9–10.3)
Chloride: 101 mmol/L (ref 101–111)
Creatinine, Ser: 0.71 mg/dL (ref 0.44–1.00)
Glucose, Bld: 122 mg/dL — ABNORMAL HIGH (ref 65–99)
POTASSIUM: 4.4 mmol/L (ref 3.5–5.1)
SODIUM: 134 mmol/L — AB (ref 135–145)

## 2015-01-13 LAB — CBC WITH DIFFERENTIAL/PLATELET
BASOS ABS: 0 10*3/uL (ref 0.0–0.1)
Basophils Relative: 0 % (ref 0–1)
EOS ABS: 0 10*3/uL (ref 0.0–0.7)
EOS PCT: 0 % (ref 0–5)
HCT: 36.6 % (ref 36.0–46.0)
Hemoglobin: 12 g/dL (ref 12.0–15.0)
LYMPHS PCT: 2 % — AB (ref 12–46)
Lymphs Abs: 0.3 10*3/uL — ABNORMAL LOW (ref 0.7–4.0)
MCH: 29.9 pg (ref 26.0–34.0)
MCHC: 32.8 g/dL (ref 30.0–36.0)
MCV: 91.3 fL (ref 78.0–100.0)
Monocytes Absolute: 0.3 10*3/uL (ref 0.1–1.0)
Monocytes Relative: 2 % — ABNORMAL LOW (ref 3–12)
Neutro Abs: 14.7 10*3/uL — ABNORMAL HIGH (ref 1.7–7.7)
Neutrophils Relative %: 96 % — ABNORMAL HIGH (ref 43–77)
PLATELETS: 289 10*3/uL (ref 150–400)
RBC: 4.01 MIL/uL (ref 3.87–5.11)
RDW: 14 % (ref 11.5–15.5)
WBC: 15.2 10*3/uL — AB (ref 4.0–10.5)

## 2015-01-13 NOTE — Progress Notes (Signed)
   Name: Diane [name] MRN: [id number] DOB: [birth year]    ADMISSION DATE:  01/02/2015 CONSULTATION DATE:  01/03/15  REFERRING MD :  Dr. Sharyon Medicus  CHIEF COMPLAINT:  VDRF   BRIEF PATIENT DESCRIPTION: 59 y/o F, smoker, with PMH of HTN, HLD, Depression / Anxiety & 2L O2 dependent COPD who presented to [facility] on 8/24 with an AECOPD.  She failed NPPV and was intubated on 8/28 for respiratory distress.  Further, transferred to [facility] in [place] for ventilator weaning.    SIGNIFICANT EVENTS  8/24 - 9/01  Admit to [facility] for AECOPD, intubated 9/01  Admit to [facility] 9/02  PCCM consulted for evaluation and extubated 9/2 9/12  On 2L O2, pending d/c in am 9/13 to rehab    STUDIES:    SUBJECTIVE:  Patient denies acute complaints.  No distress.  Minimal sputum production in the am.   VITAL SIGNS:  98.6, 81, 16, 118/76, 97% on 2L /[place]    PHYSICAL EXAMINATION: General:  Chronically ill adult female in NAD Neuro:  Alert, intact. Soft voice from recent intubation  HEENT:  MM pink/moist, no jvd Cardiovascular:  s1s2 rrr, no m/r/g Lungs:  resp's even/non-labored, lungs bilaterally diminished but clear, no wheeze Abdomen:  soft, bsx4 active, tol TF Musculoskeletal:  No acute deformities  Skin:  Warm/dry, BUE edema, none lower   Recent Labs Lab 01/11/15 0620 01/13/15 0551  NA 137 134*  K 4.1 4.4  CL 102 101  CO2 28 26  BUN 18 22*  CREATININE 0.61 0.71  GLUCOSE 124* 122*    Recent Labs Lab 01/11/15 0620 01/13/15 0551  HGB 11.4* 12.0  HCT 34.9* 36.6  WBC 14.9* 15.2*  PLT 259 289   No results found.  ASSESSMENT / PLAN:  OETT 8/28 >> 9/2   Ventilator Dependent Respiratory Failure - in the setting of AECOPD.  First ever intubation 8/28  O2 Dependent COPD with Acute Exacerbation . Extubated 9/2 and remains on O2 at 2L /[place]   Plan: Continue O2 @ 2L Wean steroids to off Consider nocturnal NIMV (however, patient had difficulty wearing) Continue Daliresp, Breo, Singulair, guaifenesin,  budesonide, loratadine Change to PRN albuterol only  Intermittent CXR  Lasix as needed Push PT efforts, family reports bed / chair bound prior to admit, limited activity.  Plan for d/c to rehab.  Consider outpatient PFT's to establish COPD staging. Suspect COPD may be near end stage. Will need follow up with Dr. Sung Amabile in the office [facility]) post discharge.    All other medical issues per [facility] MD.   PCCM will sign off.  Pending d/c 9/13 am.  Please call back if we can be of further assistance.   Canary Brim, NP-C [place] [facility] Pgr: [phone] or if no answer [phone] 01/13/2015, 11:43 AM

## 2015-01-14 LAB — VANCOMYCIN, TROUGH: VANCOMYCIN TR: 15 ug/mL (ref 10.0–20.0)

## 2015-01-15 ENCOUNTER — Encounter
Admission: RE | Admit: 2015-01-15 | Discharge: 2015-01-15 | Disposition: A | Discharge status: Home / Self Care | Payer: Managed Care, Other (non HMO) | Source: Ambulatory Visit | Attending: Internal Medicine | Admitting: Internal Medicine

## 2015-01-15 DIAGNOSIS — R000 Tachycardia, unspecified: Secondary | ICD-10-CM | POA: Insufficient documentation

## 2015-01-15 DIAGNOSIS — I959 Hypotension, unspecified: Secondary | ICD-10-CM | POA: Diagnosis not present

## 2015-01-15 LAB — URINE CULTURE: Culture: 60000

## 2015-01-15 LAB — GLUCOSE, CAPILLARY: GLUCOSE-CAPILLARY: 125 mg/dL — AB (ref 65–99)

## 2015-01-15 NOTE — Consult Note (Unsigned)
Diane [name], Diane [name]                ACCOUNT NO.:  [id number]  MEDICAL RECORD NO.:  [id number]  LOCATION:                               FACILITY:  [facility]  PHYSICIAN:  Kristine Garbe. Ezzard Standing, M.D.DATE OF BIRTH:  04/20/1956  DATE OF CONSULTATION:  01/14/2015 DATE OF DISCHARGE:                                CONSULTATION   REASON FOR CONSULT:  Evaluate the patient for hoarseness.  BRIEF HISTORY:  Diane [name] is a 59 year old female with a long history of smoking, history of COPD and emphysema.  Apparently, she developed acute respiratory failure and was admitted to an outside hospital in respiratory failure requiring intubation.  She had prolonged intubation at the outside hospital and subsequently was transferred to [facility] for further pulmonary care for expected prolonged intubation. The patient had been attempted extubation once but failed and had to be reintubated.  The patient was eventually extubated about 9 days ago but has had persistent hoarseness since her extubation.  She is able to take p.o.'s well.  She is getting ready to be discharged, but because of persistent hoarseness, I was consulted to evaluate hoarseness prior to discharge.  PHYSICAL EXAMINATION:  HEENT:  Oral exam was normal. Fiberoptic laryngoscopy was performed through the right nostril.  On fiberoptic laryngoscopy at bedside, the nasopharynx was clear,  oropharynx was clear.  Epiglottis, base of tongue, and vallecula were normal and clear. On evaluation of the vocal cords, the left vocal cord had slight edema to it and erythema and appears probably be more of a traumatic injury to the left vocal cord but had normal good mobility.  There was no obvious neoplasia noted, although there was some slight inflammation on the left vocal cord.  The right vocal cord appeared clear with no real erythema and no significant swelling and had normal mobility likewise.  Piriform sinus regions were clear  bilaterally. NECK:  No palpable adenopathy, no thyroid masses or nodules noted.  IMPRESSION:  Hoarseness most likely related to mild left vocal cord inflammation which I suspect is secondary to the re-intubation or prolonged intubation.  Would expect this to gradually resolve over the next few weeks.  There was no obvious neoplasia noted, however, if the patient continues to have hoarseness, would need to be re-evaluated in approximately 1 month.  She has normal vocal cord mobility.  RECOMMENDATION:  I recommend repeated exam as an outpatient in 1 month if the patient is still hoarse.  I also discussed with the patient concerning stopping smoking altogether because of hoarseness, pulmonary disease, and risk of cancer.          ______________________________ Kristine Garbe. Ezzard Standing, M.D.     CEN/MEDQ  D:  01/14/2015  T:  01/15/2015  Job:  [id number]  cc:   Dr. Allene Pyo office [facility]

## 2015-01-16 DIAGNOSIS — I959 Hypotension, unspecified: Secondary | ICD-10-CM | POA: Diagnosis not present

## 2015-01-17 DIAGNOSIS — I959 Hypotension, unspecified: Secondary | ICD-10-CM | POA: Diagnosis not present

## 2015-01-17 LAB — GLUCOSE, CAPILLARY
Glucose-Capillary: 85 mg/dL (ref 65–99)
Glucose-Capillary: 138 mg/dL — ABNORMAL HIGH (ref 65–99)
Glucose-Capillary: 127 mg/dL — ABNORMAL HIGH (ref 65–99)

## 2015-01-18 DIAGNOSIS — I959 Hypotension, unspecified: Secondary | ICD-10-CM | POA: Diagnosis not present

## 2015-01-18 LAB — GLUCOSE, CAPILLARY
GLUCOSE-CAPILLARY: 116 mg/dL — AB (ref 65–99)
GLUCOSE-CAPILLARY: 178 mg/dL — AB (ref 65–99)
Glucose-Capillary: 160 mg/dL — ABNORMAL HIGH (ref 65–99)

## 2015-01-19 DIAGNOSIS — I959 Hypotension, unspecified: Secondary | ICD-10-CM | POA: Diagnosis not present

## 2015-01-19 LAB — GLUCOSE, CAPILLARY
GLUCOSE-CAPILLARY: 124 mg/dL — AB (ref 65–99)
Glucose-Capillary: 144 mg/dL — ABNORMAL HIGH (ref 65–99)

## 2015-01-20 DIAGNOSIS — I959 Hypotension, unspecified: Secondary | ICD-10-CM | POA: Diagnosis not present

## 2015-01-20 LAB — GLUCOSE, CAPILLARY
GLUCOSE-CAPILLARY: 133 mg/dL — AB (ref 65–99)
Glucose-Capillary: 76 mg/dL (ref 65–99)

## 2015-01-21 DIAGNOSIS — I959 Hypotension, unspecified: Secondary | ICD-10-CM | POA: Diagnosis not present

## 2015-01-21 LAB — GLUCOSE, CAPILLARY
GLUCOSE-CAPILLARY: 74 mg/dL (ref 65–99)
Glucose-Capillary: 118 mg/dL — ABNORMAL HIGH (ref 65–99)
Glucose-Capillary: 207 mg/dL — ABNORMAL HIGH (ref 65–99)
Glucose-Capillary: 162 mg/dL — ABNORMAL HIGH (ref 65–99)

## 2015-01-22 LAB — GLUCOSE, CAPILLARY
Glucose-Capillary: 110 mg/dL — ABNORMAL HIGH (ref 65–99)
Glucose-Capillary: 175 mg/dL — ABNORMAL HIGH (ref 65–99)
Glucose-Capillary: 118 mg/dL — ABNORMAL HIGH (ref 65–99)
Glucose-Capillary: 196 mg/dL — ABNORMAL HIGH (ref 65–99)

## 2015-01-23 DIAGNOSIS — I959 Hypotension, unspecified: Secondary | ICD-10-CM | POA: Diagnosis not present

## 2015-01-23 LAB — GLUCOSE, CAPILLARY
GLUCOSE-CAPILLARY: 160 mg/dL — AB (ref 65–99)
GLUCOSE-CAPILLARY: 145 mg/dL — AB (ref 65–99)
Glucose-Capillary: 135 mg/dL — ABNORMAL HIGH (ref 65–99)

## 2015-01-24 DIAGNOSIS — I959 Hypotension, unspecified: Secondary | ICD-10-CM | POA: Diagnosis not present

## 2015-01-25 DIAGNOSIS — I959 Hypotension, unspecified: Secondary | ICD-10-CM | POA: Diagnosis not present

## 2015-01-25 LAB — GLUCOSE, CAPILLARY
GLUCOSE-CAPILLARY: 74 mg/dL (ref 65–99)
Glucose-Capillary: 110 mg/dL — ABNORMAL HIGH (ref 65–99)
Glucose-Capillary: 119 mg/dL — ABNORMAL HIGH (ref 65–99)

## 2015-01-26 LAB — GLUCOSE, CAPILLARY
GLUCOSE-CAPILLARY: 73 mg/dL (ref 65–99)
GLUCOSE-CAPILLARY: 71 mg/dL (ref 65–99)
GLUCOSE-CAPILLARY: 96 mg/dL (ref 65–99)
Glucose-Capillary: 43 mg/dL — CL (ref 65–99)
Glucose-Capillary: 105 mg/dL — ABNORMAL HIGH (ref 65–99)
Glucose-Capillary: 191 mg/dL — ABNORMAL HIGH (ref 65–99)

## 2015-01-27 DIAGNOSIS — I959 Hypotension, unspecified: Secondary | ICD-10-CM | POA: Diagnosis not present

## 2015-01-27 LAB — GLUCOSE, CAPILLARY
GLUCOSE-CAPILLARY: 124 mg/dL — AB (ref 65–99)
GLUCOSE-CAPILLARY: 141 mg/dL — AB (ref 65–99)
Glucose-Capillary: 81 mg/dL (ref 65–99)
Glucose-Capillary: 95 mg/dL (ref 65–99)

## 2015-01-28 ENCOUNTER — Encounter: Payer: Self-pay | Admitting: Pulmonary Disease

## 2015-01-28 ENCOUNTER — Telehealth: Payer: Self-pay | Admitting: *Deleted

## 2015-01-28 ENCOUNTER — Ambulatory Visit (INDEPENDENT_AMBULATORY_CARE_PROVIDER_SITE_OTHER): Payer: Managed Care, Other (non HMO) | Admitting: Pulmonary Disease

## 2015-01-28 ENCOUNTER — Ambulatory Visit: Payer: Managed Care, Other (non HMO) | Admitting: Pulmonary Disease

## 2015-01-28 VITALS — BP 118/62 | HR 115 | Wt 115.0 lb

## 2015-01-28 DIAGNOSIS — F32A Depression, unspecified: Secondary | ICD-10-CM

## 2015-01-28 DIAGNOSIS — Z23 Encounter for immunization: Secondary | ICD-10-CM

## 2015-01-28 DIAGNOSIS — R634 Abnormal weight loss: Secondary | ICD-10-CM

## 2015-01-28 DIAGNOSIS — R5381 Other malaise: Secondary | ICD-10-CM | POA: Diagnosis not present

## 2015-01-28 DIAGNOSIS — J439 Emphysema, unspecified: Secondary | ICD-10-CM

## 2015-01-28 DIAGNOSIS — R630 Anorexia: Secondary | ICD-10-CM

## 2015-01-28 DIAGNOSIS — F329 Major depressive disorder, single episode, unspecified: Secondary | ICD-10-CM

## 2015-01-28 DIAGNOSIS — I959 Hypotension, unspecified: Secondary | ICD-10-CM | POA: Diagnosis not present

## 2015-01-28 DIAGNOSIS — R000 Tachycardia, unspecified: Secondary | ICD-10-CM

## 2015-01-28 DIAGNOSIS — R110 Nausea: Secondary | ICD-10-CM

## 2015-01-28 LAB — CBC WITH DIFFERENTIAL/PLATELET
BASOS ABS: 0 10*3/uL (ref 0–0.1)
BASOS PCT: 0 %
EOS ABS: 0 10*3/uL (ref 0–0.7)
Eosinophils Relative: 1 %
HCT: 35.2 % (ref 35.0–47.0)
HEMOGLOBIN: 11.8 g/dL — AB (ref 12.0–16.0)
LYMPHS ABS: 0.7 10*3/uL — AB (ref 1.0–3.6)
Lymphocytes Relative: 11 %
MCH: 30 pg (ref 26.0–34.0)
MCHC: 33.4 g/dL (ref 32.0–36.0)
MCV: 89.9 fL (ref 80.0–100.0)
Monocytes Absolute: 0.5 10*3/uL (ref 0.2–0.9)
Monocytes Relative: 7 %
NEUTROS PCT: 81 %
Neutro Abs: 5.6 10*3/uL (ref 1.4–6.5)
Platelets: 254 10*3/uL (ref 150–440)
RBC: 3.92 MIL/uL (ref 3.80–5.20)
RDW: 14.5 % (ref 11.5–14.5)
WBC: 6.9 10*3/uL (ref 3.6–11.0)

## 2015-01-28 LAB — COMPREHENSIVE METABOLIC PANEL
ALBUMIN: 3.1 g/dL — AB (ref 3.5–5.0)
ALK PHOS: 39 U/L (ref 38–126)
ALT: 39 U/L (ref 14–54)
AST: 20 U/L (ref 15–41)
Anion gap: 10 (ref 5–15)
BUN: 7 mg/dL (ref 6–20)
CALCIUM: 9.1 mg/dL (ref 8.9–10.3)
CO2: 26 mmol/L (ref 22–32)
CREATININE: 0.47 mg/dL (ref 0.44–1.00)
Chloride: 100 mmol/L — ABNORMAL LOW (ref 101–111)
GFR calc Af Amer: >60 mL/min (ref >60)
GFR calc non Af Amer: >60 mL/min (ref >60)
GLUCOSE: 92 mg/dL (ref 65–99)
Potassium: 3.6 mmol/L (ref 3.5–5.1)
SODIUM: 136 mmol/L (ref 135–145)
Total Bilirubin: 0.9 mg/dL (ref 0.3–1.2)
Total Protein: 6.1 g/dL — ABNORMAL LOW (ref 6.5–8.1)

## 2015-01-28 LAB — GLUCOSE, CAPILLARY
Glucose-Capillary: 90 mg/dL (ref 65–99)
Glucose-Capillary: 107 mg/dL — ABNORMAL HIGH (ref 65–99)
Glucose-Capillary: 109 mg/dL — ABNORMAL HIGH (ref 65–99)

## 2015-01-28 MED ORDER — METOPROLOL SUCCINATE ER 25 MG PO TB24: 25.0000 mg | ORAL_TABLET | Freq: Every day | ORAL | Status: DC

## 2015-01-28 NOTE — Patient Instructions (Signed)
We talked about the need for putting some weight back on and regaining your strength through PT Stop Daliresp as this might be contributing to your nausea and weight loss Stop Mucinex as this might be contributing to your nausea Cont nebulized steroids (Pulmicort) as ordered Continue Duoneb 3 times a day and every four hours as needed Low dose Metoprolol XL (25 mg once a day) - this medication is started for the rapid heart rate  Return office visit in 2-4 wks

## 2015-01-28 NOTE — Telephone Encounter (Signed)
Pt nurse from [facility] calling asking about medication that were just sent home with patient.  We told her to start the Zpack and pt can't take it Citalopram Hydrobromide.  Pt states she does not want to stop that in order to take the z pack Please advice.

## 2015-01-28 NOTE — Progress Notes (Signed)
Recent hospitalization for AECOPD which eventually required intubation. Was transferred to [facility] in [place] where she was extubated and rehab was initiated. Eventually transferred to [facility] rehab SNF. Her course has been complicated by anorexia, nausea, wt loss, limited improvement with PT, tachyarrhythmias which prevent SNF personell from working with her on PT. She is profoundly depressed and tearful during this visit. She has lost 15 lbs in the last month. She expresses a desire to get well enough to go home soon but acknowledges that she is too weak to thrive at home presently. Her husband raises question of feasibility of lung transplantation   Filed Vitals:   01/28/15 1013  BP: 118/62  Pulse: 115  Weight: 115 lb (52.164 kg)  SpO2: 96%   Thin/cachectic Tearful HEENT WNL No JVD or LAN BS moderately diminished, no wheezes Tachy, reg, no M Soft, NT, +BS No C/C/E  IMPRESSION: Severe emphysema Recent AECOPD requiring intubation Recent prolonged hospitalization Severe wt loss/cachexia - complicated by nausea, anorexia  Several medications might be contributing to this - guaifenesin, daliresp Profound deconditioning Sinus tachycardia - severe enough to be inhibiting PT efforts Post ICU syndrome with depression, deconditioning, significant loss in function  PLAN/REC: Continue nebulized steroids Continue nebulized bronchodilators Cont [place] O2 Discussed need for increased caloric intake - no dietary restrictions for now DC Daliresp and Guaifenesin as potential contributors to nausea and anorexia Discussed need for continued PT Low dose metoprolol for tachycardia (to permit more effective PT) Discussed perspective of taking note of small gains from day to day rather than focusing on how much she has lost from prior level of function Further mgmt of depression per primary MD During this visit, I spent 25 mins counseling the patient and her husband regarding the above impression and  plan We discussed the role of lung transplantation and I made it very clear that she is not presently a candidate due to her profound deconditioning and depression  It is unlikely that she will ever be a good candidate for transplantation but I indicated that this topic could be re-addressed in the future if she makes significant gains    PT INSTRUCTIONS: We talked about the need for putting some weight back on and regaining your strength through PT Stop Daliresp as this might be contributing to your nausea and weight loss Stop Mucinex as this might be contributing to your nausea Cont nebulized steroids (Pulmicort) as ordered Continue Duoneb 3 times a day and every four hours as needed Low dose Metoprolol XL (25 mg once a day) - this medication is started for the rapid heart rate  Return office visit in 2-4 wks   Billy Fischer, MD PCCM service Mobile [phone] Pager [phone]

## 2015-01-28 NOTE — Telephone Encounter (Signed)
Called to speak with the nurse. They state she is in with a pt and they will have her to call me back.

## 2015-01-29 DIAGNOSIS — I959 Hypotension, unspecified: Secondary | ICD-10-CM | POA: Diagnosis not present

## 2015-01-29 LAB — GLUCOSE, CAPILLARY
Glucose-Capillary: 184 mg/dL — ABNORMAL HIGH (ref 65–99)
Glucose-Capillary: 106 mg/dL — ABNORMAL HIGH (ref 65–99)

## 2015-01-30 DIAGNOSIS — I959 Hypotension, unspecified: Secondary | ICD-10-CM | POA: Diagnosis not present

## 2015-01-30 LAB — COMPREHENSIVE METABOLIC PANEL
ALT: 22 U/L (ref 14–54)
ANION GAP: 6 (ref 5–15)
AST: 21 U/L (ref 15–41)
Albumin: 2.6 g/dL — ABNORMAL LOW (ref 3.5–5.0)
Alkaline Phosphatase: 42 U/L (ref 38–126)
BUN: 8 mg/dL (ref 6–20)
CHLORIDE: 96 mmol/L — AB (ref 101–111)
CO2: 27 mmol/L (ref 22–32)
Calcium: 8 mg/dL — ABNORMAL LOW (ref 8.9–10.3)
Creatinine, Ser: 0.64 mg/dL (ref 0.44–1.00)
GFR calc non Af Amer: >60 mL/min (ref >60)
Glucose, Bld: 73 mg/dL (ref 65–99)
Potassium: 3.8 mmol/L (ref 3.5–5.1)
SODIUM: 129 mmol/L — AB (ref 135–145)
Total Bilirubin: 0.9 mg/dL (ref 0.3–1.2)
Total Protein: 5.5 g/dL — ABNORMAL LOW (ref 6.5–8.1)

## 2015-01-30 LAB — CBC
HCT: 30.9 % — ABNORMAL LOW (ref 35.0–47.0)
Hemoglobin: 10.5 g/dL — ABNORMAL LOW (ref 12.0–16.0)
MCH: 30.5 pg (ref 26.0–34.0)
MCHC: 33.9 g/dL (ref 32.0–36.0)
MCV: 90 fL (ref 80.0–100.0)
PLATELETS: 263 10*3/uL (ref 150–440)
RBC: 3.43 MIL/uL — ABNORMAL LOW (ref 3.80–5.20)
RDW: 14.7 % — ABNORMAL HIGH (ref 11.5–14.5)
WBC: 6.7 10*3/uL (ref 3.6–11.0)

## 2015-01-30 LAB — TSH: TSH: 1.051 u[IU]/mL (ref 0.350–4.500)

## 2015-01-30 LAB — GLUCOSE, CAPILLARY
GLUCOSE-CAPILLARY: 89 mg/dL (ref 65–99)
GLUCOSE-CAPILLARY: 73 mg/dL (ref 65–99)

## 2015-01-30 LAB — MAGNESIUM: MAGNESIUM: 1.8 mg/dL (ref 1.7–2.4)

## 2015-01-30 NOTE — Telephone Encounter (Signed)
The facility where the pt is is asking if you will prescribe something different than the Zpack. States they are weaning the Citalopram to change her to Zoloft. Please advise.

## 2015-01-31 DIAGNOSIS — I959 Hypotension, unspecified: Secondary | ICD-10-CM | POA: Diagnosis not present

## 2015-01-31 LAB — GLUCOSE, CAPILLARY
GLUCOSE-CAPILLARY: 80 mg/dL (ref 65–99)
GLUCOSE-CAPILLARY: 79 mg/dL (ref 65–99)
GLUCOSE-CAPILLARY: 71 mg/dL (ref 65–99)
Glucose-Capillary: 78 mg/dL (ref 65–99)
Glucose-Capillary: 108 mg/dL — ABNORMAL HIGH (ref 65–99)

## 2015-01-31 LAB — VITAMIN B12: VITAMIN B 12: 489 pg/mL (ref 180–914)

## 2015-02-01 ENCOUNTER — Encounter
Admission: RE | Admit: 2015-02-01 | Discharge: 2015-02-01 | Disposition: A | Discharge status: Home / Self Care | Payer: Managed Care, Other (non HMO) | Source: Ambulatory Visit | Attending: Internal Medicine | Admitting: Internal Medicine

## 2015-02-01 DIAGNOSIS — E871 Hypo-osmolality and hyponatremia: Secondary | ICD-10-CM | POA: Insufficient documentation

## 2015-02-03 NOTE — Telephone Encounter (Signed)
She should have completed the Z pak by now. I did not prescribe that medicine. I do not think that she needs an antibiotic presently  Theodoro Grist

## 2015-02-03 NOTE — Telephone Encounter (Signed)
Informed rehab facility per DS recommendations. Nothing further needed.

## 2015-02-04 ENCOUNTER — Telehealth: Payer: Self-pay | Admitting: Pulmonary Disease

## 2015-02-04 NOTE — Telephone Encounter (Signed)
ATC Lane Hacker to figure out who she is and why these orders from 4 months ago are needed to be printed off, signed, and faxed as the callback # provided is a [facility] number, so any labs ordered should be accessible in Epic.   Line rang for over 1 minute, no answer, no vm.  Wcb.

## 2015-02-04 NOTE — Telephone Encounter (Signed)
Lucy Beal states that patient's orders from 09/12/14 need written diagnosis and needs to be electronically signed. Needs to be faxed to: [phone] Attn: Lucy Beal Lucy notified that Dr. McQuaid is out of the office until Monday.   She requests that he take care of this on Monday when he returns because they are trying to get the older orders closed out.  To [name] and Dr. McQuaid 

## 2015-02-05 NOTE — Telephone Encounter (Signed)
ATC # provided and did not get any answer and no VM. WCB

## 2015-02-06 DIAGNOSIS — E871 Hypo-osmolality and hyponatremia: Secondary | ICD-10-CM | POA: Diagnosis present

## 2015-02-06 LAB — VITAMIN D 1,25 DIHYDROXY
VITAMIN D 1, 25 (OH) TOTAL: 15 pg/mL
VITAMIN D3 1, 25 (OH): 15 pg/mL

## 2015-02-06 LAB — BASIC METABOLIC PANEL
Anion gap: 8 (ref 5–15)
BUN: <5 mg/dL — ABNORMAL LOW (ref 6–20)
CALCIUM: 8.6 mg/dL — AB (ref 8.9–10.3)
CO2: 29 mmol/L (ref 22–32)
CREATININE: 0.37 mg/dL — AB (ref 0.44–1.00)
Chloride: 101 mmol/L (ref 101–111)
GLUCOSE: 74 mg/dL (ref 65–99)
Potassium: 3.5 mmol/L (ref 3.5–5.1)
Sodium: 138 mmol/L (ref 135–145)

## 2015-02-06 NOTE — Telephone Encounter (Signed)
atc Lane Hacker X3, no answer, no vm.  Will close per triage protocol.

## 2015-02-08 DIAGNOSIS — E871 Hypo-osmolality and hyponatremia: Secondary | ICD-10-CM | POA: Diagnosis not present

## 2015-02-09 LAB — GLUCOSE, CAPILLARY
GLUCOSE-CAPILLARY: 81 mg/dL (ref 65–99)
Glucose-Capillary: 79 mg/dL (ref 65–99)

## 2015-02-10 DIAGNOSIS — E871 Hypo-osmolality and hyponatremia: Secondary | ICD-10-CM | POA: Diagnosis not present

## 2015-02-10 LAB — GLUCOSE, CAPILLARY
GLUCOSE-CAPILLARY: 83 mg/dL (ref 65–99)
GLUCOSE-CAPILLARY: 72 mg/dL (ref 65–99)

## 2015-02-10 NOTE — Telephone Encounter (Signed)
Let me know if anything needs to be done

## 2015-02-12 DIAGNOSIS — E871 Hypo-osmolality and hyponatremia: Secondary | ICD-10-CM | POA: Diagnosis not present

## 2015-02-12 LAB — GLUCOSE, CAPILLARY: Glucose-Capillary: 77 mg/dL (ref 65–99)

## 2015-02-14 DIAGNOSIS — E871 Hypo-osmolality and hyponatremia: Secondary | ICD-10-CM | POA: Diagnosis not present

## 2015-02-14 LAB — GLUCOSE, CAPILLARY: GLUCOSE-CAPILLARY: 72 mg/dL (ref 65–99)

## 2015-02-15 DIAGNOSIS — E871 Hypo-osmolality and hyponatremia: Secondary | ICD-10-CM | POA: Diagnosis not present

## 2015-02-15 LAB — GLUCOSE, CAPILLARY
Glucose-Capillary: 75 mg/dL (ref 65–99)
Glucose-Capillary: 79 mg/dL (ref 65–99)

## 2015-02-16 DIAGNOSIS — E871 Hypo-osmolality and hyponatremia: Secondary | ICD-10-CM | POA: Diagnosis not present

## 2015-02-16 LAB — GLUCOSE, CAPILLARY: GLUCOSE-CAPILLARY: 85 mg/dL (ref 65–99)

## 2015-02-17 DIAGNOSIS — E871 Hypo-osmolality and hyponatremia: Secondary | ICD-10-CM | POA: Diagnosis not present

## 2015-02-19 LAB — GLUCOSE, CAPILLARY
GLUCOSE-CAPILLARY: 74 mg/dL (ref 65–99)
Glucose-Capillary: 79 mg/dL (ref 65–99)
Glucose-Capillary: 69 mg/dL (ref 65–99)

## 2015-02-20 DIAGNOSIS — E871 Hypo-osmolality and hyponatremia: Secondary | ICD-10-CM | POA: Diagnosis not present

## 2015-02-20 LAB — GLUCOSE, CAPILLARY: Glucose-Capillary: 71 mg/dL (ref 65–99)

## 2015-02-21 ENCOUNTER — Ambulatory Visit (INDEPENDENT_AMBULATORY_CARE_PROVIDER_SITE_OTHER): Payer: Managed Care, Other (non HMO) | Admitting: Pulmonary Disease

## 2015-02-21 ENCOUNTER — Encounter: Payer: Self-pay | Admitting: Pulmonary Disease

## 2015-02-21 VITALS — BP 116/66 | HR 80 | Wt 124.0 lb

## 2015-02-21 DIAGNOSIS — J439 Emphysema, unspecified: Secondary | ICD-10-CM | POA: Diagnosis not present

## 2015-02-21 DIAGNOSIS — R5381 Other malaise: Secondary | ICD-10-CM

## 2015-02-21 DIAGNOSIS — Z9981 Dependence on supplemental oxygen: Secondary | ICD-10-CM | POA: Diagnosis not present

## 2015-02-21 DIAGNOSIS — Z720 Tobacco use: Secondary | ICD-10-CM | POA: Diagnosis not present

## 2015-02-21 DIAGNOSIS — F17200 Nicotine dependence, unspecified, uncomplicated: Secondary | ICD-10-CM

## 2015-02-21 DIAGNOSIS — E871 Hypo-osmolality and hyponatremia: Secondary | ICD-10-CM | POA: Diagnosis not present

## 2015-02-21 LAB — GLUCOSE, CAPILLARY: Glucose-Capillary: 74 mg/dL (ref 65–99)

## 2015-02-21 NOTE — Patient Instructions (Signed)
Continue current medications as prescribed on your discharge summary [facility] except as follows:    Change Clonazepam to every 6-8 hrs as needed for anxiety. It is probably OK to take a dose at night every night on a schedule Congratulations on the great improvements since last visit and on the smoking cessation. Keep it up!!! When you return, we will discuss whether we can transition back to inhaler medications for simplicity sake In the future, we will discuss enrollment in pulmonary rehabilitation program It is OK for you to undergo cataract surgery now By you lung function measurements, you meet criteria for permanent disability - we will help you pursue this Follow up in 6 weeks

## 2015-02-21 NOTE — Progress Notes (Signed)
PT PROFILE: Hospitalization 12/25/14 - 01/03/15 for AECOPD requiring intubation [facility]. Course has been complicated by anorexia, nausea, wt loss, depression.  SUBJ: Last seen approx 3 weeks ago and was profoundly depressed with severe weight loss and limited progress in PT. Today, markedly improved in spirit. She was DC'd from Rehab facility today. Her appetite is improved and her weight is up. Her respiratory status is much improved. She is committed to remaining a nonsmoker. She and her husband have many questions about what happens from here and questions re: her current medical regimen   Filed Vitals:   02/21/15 1141  BP: 116/66  Pulse: 80  Weight: 56.246 kg (124 lb)  SpO2: 95%   NAD HEENT WNL No JVD or LAN BS moderately diminished, no wheezes Tachy, reg, no M Soft, NT, +BS No C/C/E  MEDICATIONS: reviewed. Her respiratory medications include nebulized budesonide, ipratropium and levalbuterol. In addition, she has a prescripition for clonazepam 1 mg QID  IMPRESSION: Severe emphysema Recent AECOPD requiring intubation with prolonged hospitalization Severe wt loss - improving Anorexia - resolved Deconditioning - improving Smoker - abstinent since 12/25/14 Anxiety - I am very concerned about the very large dose of clonazepam  PLAN/REC: Continue nebulized steroids and bronchodilators as prescribed for now  Upon return, we can discuss changing back to inhaler medications for simplicity sake Cont Optima O2 She is to continue PT efforts - she will be receiving home PT BIW for now  We can discuss whether pulm rehab would be appropriate for her in the future Cont low dose metoprolol for tachycardia Cont current antidepressants for now. Over time, these might be able to be reduced/simplified Eventually we will obtain PFTs  I re-emphasized the need for absolute smoking abstinence We again discussed the role of lung transplantation - she is still not a  candidate for this, but with the significant improvements since last visit, this might be a consideration in the future    PT INSTRUCTIONS: Continue current medications as prescribed on your discharge summary [facility] except as follows:    Change Clonazepam to every 6-8 hrs as needed for anxiety. It is probably OK to take a dose at night every night on a schedule Congratulations on the great improvements since last visit and on the smoking cessation. Keep it up!!! When you return, we will discuss whether we can transition back to inhaler medications for simplicity sake In the future, we will discuss enrollment in pulmonary rehabilitation program It is OK for you to undergo cataract surgery now By you lung function measurements, you meet criteria for permanent disability - we will help you pursue this Follow up in 6 weeks   Billy Fischeravid Simonds, MD PCCM service Mobile [phone] Pager [phone]

## 2015-03-04 ENCOUNTER — Ambulatory Visit (INDEPENDENT_AMBULATORY_CARE_PROVIDER_SITE_OTHER): Payer: Managed Care, Other (non HMO) | Admitting: Internal Medicine

## 2015-03-04 ENCOUNTER — Encounter: Payer: Self-pay | Admitting: Internal Medicine

## 2015-03-04 VITALS — BP 138/72 | HR 59 | Ht 67.0 in | Wt 128.0 lb

## 2015-03-04 DIAGNOSIS — J441 Chronic obstructive pulmonary disease with (acute) exacerbation: Secondary | ICD-10-CM

## 2015-03-04 MED ORDER — AZITHROMYCIN 250 MG PO TABS: ORAL_TABLET | ORAL | Status: DC

## 2015-03-04 MED ORDER — UMECLIDINIUM BROMIDE 62.5 MCG/INH IN AEPB: 1.0000 | INHALATION_SPRAY | Freq: Every day | RESPIRATORY_TRACT | Status: AC

## 2015-03-04 MED ORDER — PREDNISONE 50 MG PO TABS: 1.0000 mg | ORAL_TABLET | Freq: Every day | ORAL | Status: DC

## 2015-03-04 MED ORDER — IPRATROPIUM-ALBUTEROL 0.5-2.5 (3) MG/3ML IN SOLN
3.0000 mL | Freq: Once | RESPIRATORY_TRACT | Status: AC
  Administered 2015-03-04: 3 mL via RESPIRATORY_TRACT

## 2015-03-04 MED ORDER — FLUTICASONE FUROATE-VILANTEROL 200-25 MCG/INH IN AEPB: 1.0000 | INHALATION_SPRAY | Freq: Every day | RESPIRATORY_TRACT | Status: AC

## 2015-03-04 NOTE — Addendum Note (Signed)
Addended by: Meyer CoryAHMAD, Earma Nicolaou R on: 03/04/2015 05:19 PM   Modules accepted: Orders

## 2015-03-04 NOTE — Progress Notes (Signed)
[facility] Pulmonary Medicine Consultation     Date: 03/04/2015,   MRN# [id number] Diane [name] [birth year] Code Status:  Hosp day:@LENGTHOFSTAYDAYS @ Referring MD: @     PCP:      AdmissionWeight: 128 lb (58.06 kg)                 CurrentWeight: 128 lb (58.06 kg) Diane [name] is a 59 y.o. old female seen in consultation for acute COPD exacerbation  PT PROFILE: Hospitalization 12/25/14 - 01/03/15 for AECOPD requiring intubation followed by [facility] and rehab facility. Course has been complicated by anorexia, nausea, wt loss, depression. Chronic oxygen therapy, previous h/o intubation for COPD exacerbation   CC: SOB and wheezing HPI: patient with increased WOB and SOB, wheezing and coughing for last several days Patient denies fevers, was on pulmicort nebs as outpatient Will give NEB therapy Now and if does NOT improve will need to go to ER   Current Medication:   Current outpatient prescriptions:  .  budesonide (PULMICORT) 0.25 MG/2ML nebulizer solution, Take 2 mLs (0.25 mg total) by nebulization every 6 (six) hours. (Patient taking differently: Take 0.5 mg by nebulization 2 (two) times daily. ), Disp: 60 mL, Rfl: 12 .  Cholecalciferol (VITAMIN D-3 PO), Take 2,000 Units by mouth daily., Disp: , Rfl:  .  clonazePAM (KLONOPIN) 0.5 MG tablet, Take 0.5 mg by mouth 2 (two) times daily as needed for anxiety., Disp: , Rfl:  .  Eszopiclone (ESZOPICLONE) 3 MG TABS, Take 3 mg by mouth at bedtime., Disp: , Rfl:  .  famotidine (PEPCID) 20 MG tablet, Take 20 mg by mouth 2 (two) times daily., Disp: , Rfl:  .  ipratropium-albuterol (DUONEB) 0.5-2.5 (3) MG/3ML SOLN, Take 3 mLs by nebulization 3 (three) times daily., Disp: , Rfl:  .  Loratadine 10 MG CAPS, Take 1 capsule by mouth daily., Disp: , Rfl:  .  Melatonin 5 MG TABS, Take 5 mg by mouth at bedtime., Disp: , Rfl:  .  metoprolol succinate (TOPROL XL) 25 MG 24 hr tablet, Take 1 tablet (25 mg total) by mouth daily., Disp: 30 tablet,  Rfl: 11 .  mirtazapine (REMERON) 7.5 MG tablet, Take 7.5 mg by mouth at bedtime., Disp: , Rfl:  .  Multiple Vitamin (MULTIVITAMIN) capsule, Take 1 capsule by mouth daily., Disp: , Rfl:  .  promethazine (PHENERGAN) 25 MG tablet, Take 25 mg by mouth every 4 (four) hours as needed for nausea or vomiting., Disp: , Rfl:  .  senna (SENOKOT) 8.6 MG tablet, Take 1 tablet by mouth daily., Disp: , Rfl:  .  sertraline (ZOLOFT) 100 MG tablet, Take 100 mg by mouth daily., Disp: , Rfl:  .  albuterol (PROVENTIL) (2.5 MG/3ML) 0.083% nebulizer solution, , Disp: , Rfl:  .  VENTOLIN HFA 108 (90 BASE) MCG/ACT inhaler, , Disp: , Rfl:      ALLERGIES   Aspirin; Levofloxacin; and Penicillins     REVIEW OF SYSTEMS   Review of Systems  Constitutional: Positive for malaise/fatigue. Negative for fever and chills.  Respiratory: Positive for cough, shortness of breath and wheezing.   Cardiovascular: Negative for chest pain and palpitations.  Psychiatric/Behavioral: The patient is nervous/anxious.      VS: Pulse 59  Ht  (1.702 m)  Wt 128 lb (58.06 kg)  BMI 20.04 kg/m2  SpO2 92%     PHYSICAL EXAM   Physical Exam  Constitutional: She is oriented to person, place, and time. She appears distressed.  Cardiovascular: Normal rate  and regular rhythm.   No murmur heard. Pulmonary/Chest: She is in respiratory distress. She has wheezes.  Neurological: She is alert and oriented to person, place, and time.           ASSESSMENT/PLAN   59 yo oxygen dependant COPD with acute COPD exacerbation  Patient responded to douneb therapy, feels better, wheezing resolving  1.prescribe oral prednisone 2.start breo-ellipta(inhaled steroids/LABA) 3.start incruse(inhaled AC) 4.abx with z pack  Follow up with Dr. Sung AmabileSimonds in 1 month  Patient advised to go to ER if symptoms worsen Patient/Family are satisfied with Plan of action and management. All questions answered   Lucie LeatherKurian David Anyelina Claycomb, M.D.  [facility]  Pulmonary & Critical Care Medicine  Medical Director [facility] Medical Director [facility] Cardio-Pulmonary Department

## 2015-03-04 NOTE — Patient Instructions (Signed)
Chronic Obstructive Pulmonary Disease Chronic obstructive pulmonary disease (COPD) is a common lung condition in which airflow from the lungs is limited. COPD is a general term that can be used to describe many different lung problems that limit airflow, including both chronic bronchitis and emphysema. If you have COPD, your lung function will probably never return to normal, but there are measures you can take to improve lung function and make yourself feel better. CAUSES   Smoking (common).  Exposure to secondhand smoke.  Genetic problems.  Chronic inflammatory lung diseases or recurrent infections. SYMPTOMS  Shortness of breath, especially with physical activity.  Deep, persistent (chronic) cough with a large amount of thick mucus.  Wheezing.  Rapid breaths (tachypnea).  Gray or bluish discoloration (cyanosis) of the skin, especially in your fingers, toes, or lips.  Fatigue.  Weight loss.  Frequent infections or episodes when breathing symptoms become much worse (exacerbations).  Chest tightness. DIAGNOSIS Your health care provider will take a medical history and perform a physical examination to diagnose COPD. Additional tests for COPD may include:  Lung (pulmonary) function tests.  Chest X-ray.  CT scan.  Blood tests. TREATMENT  Treatment for COPD may include:  Inhaler and nebulizer medicines. These help manage the symptoms of COPD and make your breathing more comfortable.  Supplemental oxygen. Supplemental oxygen is only helpful if you have a low oxygen level in your blood.  Exercise and physical activity. These are beneficial for nearly all people with COPD.  Lung surgery or transplant.  Nutrition therapy to gain weight, if you are underweight.  Pulmonary rehabilitation. This may involve working with a team of health care providers and specialists, such as respiratory, occupational, and physical therapists. HOME CARE INSTRUCTIONS  Take all medicines  (inhaled or pills) as directed by your health care provider.  Avoid over-the-counter medicines or cough syrups that dry up your airway (such as antihistamines) and slow down the elimination of secretions unless instructed otherwise by your health care provider.  If you are a smoker, the most important thing that you can do is stop smoking. Continuing to smoke will cause further lung damage and breathing trouble. Ask your health care provider for help with quitting smoking. He or she can direct you to community resources or hospitals that provide support.  Avoid exposure to irritants such as smoke, chemicals, and fumes that aggravate your breathing.  Use oxygen therapy and pulmonary rehabilitation if directed by your health care provider. If you require home oxygen therapy, ask your health care provider whether you should purchase a pulse oximeter to measure your oxygen level at home.  Avoid contact with individuals who have a contagious illness.  Avoid extreme temperature and humidity changes.  Eat healthy foods. Eating smaller, more frequent meals and resting before meals may help you maintain your strength.  Stay active, but balance activity with periods of rest. Exercise and physical activity will help you maintain your ability to do things you want to do.  Preventing infection and hospitalization is very important when you have COPD. Make sure to receive all the vaccines your health care provider recommends, especially the pneumococcal and influenza vaccines. Ask your health care provider whether you need a pneumonia vaccine.  Learn and use relaxation techniques to manage stress.  Learn and use controlled breathing techniques as directed by your health care provider. Controlled breathing techniques include:  Pursed lip breathing. Start by breathing in (inhaling) through your nose for 1 second. Then, purse your lips as if you were   going to whistle and breathe out (exhale) through the  pursed lips for 2 seconds.  Diaphragmatic breathing. Start by putting one hand on your abdomen just above your waist. Inhale slowly through your nose. The hand on your abdomen should move out. Then purse your lips and exhale slowly. You should be able to feel the hand on your abdomen moving in as you exhale.  Learn and use controlled coughing to clear mucus from your lungs. Controlled coughing is a series of short, progressive coughs. The steps of controlled coughing are: 1. Lean your head slightly forward. 2. Breathe in deeply using diaphragmatic breathing. 3. Try to hold your breath for 3 seconds. 4. Keep your mouth slightly open while coughing twice. 5. Spit any mucus out into a tissue. 6. Rest and repeat the steps once or twice as needed. SEEK MEDICAL CARE IF:  You are coughing up more mucus than usual.  There is a change in the color or thickness of your mucus.  Your breathing is more labored than usual.  Your breathing is faster than usual. SEEK IMMEDIATE MEDICAL CARE IF:  You have shortness of breath while you are resting.  You have shortness of breath that prevents you from:  Being able to talk.  Performing your usual physical activities.  You have chest pain lasting longer than 5 minutes.  Your skin color is more cyanotic than usual.  You measure low oxygen saturations for longer than 5 minutes with a pulse oximeter. MAKE SURE YOU:  Understand these instructions.  Will watch your condition.  Will get help right away if you are not doing well or get worse.   This information is not intended to replace advice given to you by your health care provider. Make sure you discuss any questions you have with your health care provider.   Document Released: 01/27/2005 Document Revised: 05/10/2014 Document Reviewed: 12/14/2012 Elsevier Interactive Patient Education 2016 Elsevier Inc.  

## 2015-03-05 ENCOUNTER — Telehealth: Payer: Self-pay | Admitting: *Deleted

## 2015-03-05 MED ORDER — IPRATROPIUM-ALBUTEROL 0.5-2.5 (3) MG/3ML IN SOLN: 3.0000 mL | RESPIRATORY_TRACT | Status: DC | PRN

## 2015-03-05 MED ORDER — UMECLIDINIUM BROMIDE 62.5 MCG/INH IN AEPB: 1.0000 | INHALATION_SPRAY | Freq: Every day | RESPIRATORY_TRACT | Status: DC

## 2015-03-05 MED ORDER — FLUTICASONE FUROATE-VILANTEROL 200-25 MCG/INH IN AEPB: 1.0000 | INHALATION_SPRAY | Freq: Every day | RESPIRATORY_TRACT | Status: DC

## 2015-03-05 NOTE — Telephone Encounter (Signed)
°*  STAT* If patient is at the pharmacy, call can be transferred to refill team.   1. Which medications need to be refilled? (please list name of each medication and dose if known) Ipratropium albuterol   2. Which pharmacy/location (including street and city if local pharmacy) [facility] [address] [facility]   3. Do they need a 30 day or 90 day supply?    Pt is also stating that this was given to her by another doctor but since we are taking over on her care, if we could send in this along with the other refills.

## 2015-03-05 NOTE — Telephone Encounter (Signed)
Pt informed medication sent to pharmacy. Nothing further needed. 

## 2015-03-17 ENCOUNTER — Telehealth: Payer: Self-pay | Admitting: Pulmonary Disease

## 2015-03-17 NOTE — Telephone Encounter (Signed)
Just leave on my desk with instructions

## 2015-03-17 NOTE — Telephone Encounter (Signed)
From previous telephone encounter which closed d/t no response, see below.  Diane [name] is needing orders which she is faxing to our office to be signed and faxed back ASAP. Diane fax number verified as [phone] Call back [phone] Michelle P Gay, CMA at 02/04/2015 11:56 AM     Status: Signed       Expand All Collapse All   Diane [name] states that patient's orders from 09/12/14 need written diagnosis and needs to be electronically signed. Needs to be faxed to: [phone] Attn: Diane [name] Diane notified that Dr. McQuaid is out of the office until Monday.  She requests that he take care of this on Monday when he returns because they are trying to get the older orders closed out.  To [name] and Dr. McQuaid       Will send to [name] to follow up and ensure this gets received and signed by BQ 

## 2015-03-19 NOTE — Telephone Encounter (Signed)
This has been received, placed in BQ's look-at folder (which is now on his desk).  Awaiting BQ's signature to fax this back.  

## 2015-03-19 NOTE — Telephone Encounter (Signed)
Ash, please advise if this was done, thanks  

## 2015-03-20 ENCOUNTER — Telehealth: Payer: Self-pay | Admitting: *Deleted

## 2015-03-20 NOTE — Telephone Encounter (Signed)
Dr.Simonds,  Pt is requesting a refill on Mirtazapine and Sertraline. Doesn't look like we have filled those for the pt previously. May I refill along with her Metoprolol? Thanks

## 2015-03-20 NOTE — Telephone Encounter (Signed)
°*  STAT* If patient is at the pharmacy, call can be transferred to refill team.   1. Which medications need to be refilled? (please list name of each medication and dose if known)  Mirtazapine Metoprolol Sertraline 2. Which pharmacy/location (including street and city if local pharmacy) is medication to be sent to? [facility] on [address]  3. Do they need a 30 day or 90 day supply? 90 day

## 2015-03-24 ENCOUNTER — Encounter: Payer: Self-pay | Admitting: *Deleted

## 2015-03-24 ENCOUNTER — Telehealth: Payer: Self-pay

## 2015-03-24 NOTE — Telephone Encounter (Signed)
Lucy beal calling to check on the status of fiorms, please advise.Caren GriffinsStanley A Dalton

## 2015-03-24 NOTE — Telephone Encounter (Signed)
Sent message to DS to advise again.

## 2015-03-24 NOTE — Telephone Encounter (Signed)
Called spoke with Lucy and aware BQ not back in the office until tomorrow. Will forward to ashley to have him sign ASAP

## 2015-03-24 NOTE — Telephone Encounter (Signed)
Pt has a question regarding the medications she called about last week.

## 2015-03-24 NOTE — Telephone Encounter (Signed)
Document will be taken care of and signed when BQ returns to the office.

## 2015-03-25 ENCOUNTER — Ambulatory Visit
Admission: RE | Admit: 2015-03-25 | Discharge: 2015-03-25 | Disposition: A | Discharge status: Home / Self Care | Payer: Managed Care, Other (non HMO) | Source: Ambulatory Visit | Attending: Ophthalmology | Admitting: Ophthalmology

## 2015-03-25 ENCOUNTER — Ambulatory Visit: Payer: Managed Care, Other (non HMO) | Admitting: Certified Registered"

## 2015-03-25 ENCOUNTER — Encounter
Admission: RE | Disposition: A | Discharge status: Home / Self Care | Payer: Self-pay | Source: Ambulatory Visit | Attending: Ophthalmology

## 2015-03-25 DIAGNOSIS — Z886 Allergy status to analgesic agent status: Secondary | ICD-10-CM | POA: Insufficient documentation

## 2015-03-25 DIAGNOSIS — Z880 Allergy status to penicillin: Secondary | ICD-10-CM | POA: Insufficient documentation

## 2015-03-25 DIAGNOSIS — Z9981 Dependence on supplemental oxygen: Secondary | ICD-10-CM | POA: Diagnosis not present

## 2015-03-25 DIAGNOSIS — T7840XA Allergy, unspecified, initial encounter: Secondary | ICD-10-CM | POA: Insufficient documentation

## 2015-03-25 DIAGNOSIS — F329 Major depressive disorder, single episode, unspecified: Secondary | ICD-10-CM | POA: Insufficient documentation

## 2015-03-25 DIAGNOSIS — I10 Essential (primary) hypertension: Secondary | ICD-10-CM | POA: Diagnosis not present

## 2015-03-25 DIAGNOSIS — K219 Gastro-esophageal reflux disease without esophagitis: Secondary | ICD-10-CM | POA: Diagnosis not present

## 2015-03-25 DIAGNOSIS — Z87891 Personal history of nicotine dependence: Secondary | ICD-10-CM | POA: Diagnosis not present

## 2015-03-25 DIAGNOSIS — H2511 Age-related nuclear cataract, right eye: Secondary | ICD-10-CM | POA: Insufficient documentation

## 2015-03-25 DIAGNOSIS — R079 Chest pain, unspecified: Secondary | ICD-10-CM | POA: Insufficient documentation

## 2015-03-25 DIAGNOSIS — Z79899 Other long term (current) drug therapy: Secondary | ICD-10-CM | POA: Insufficient documentation

## 2015-03-25 DIAGNOSIS — Z7952 Long term (current) use of systemic steroids: Secondary | ICD-10-CM | POA: Diagnosis not present

## 2015-03-25 DIAGNOSIS — H269 Unspecified cataract: Secondary | ICD-10-CM | POA: Diagnosis present

## 2015-03-25 DIAGNOSIS — Z881 Allergy status to other antibiotic agents status: Secondary | ICD-10-CM | POA: Insufficient documentation

## 2015-03-25 DIAGNOSIS — J449 Chronic obstructive pulmonary disease, unspecified: Secondary | ICD-10-CM | POA: Diagnosis not present

## 2015-03-25 HISTORY — DX: Tremor, unspecified: R25.1

## 2015-03-25 HISTORY — DX: Hypoxemia: R09.02

## 2015-03-25 HISTORY — DX: Wheezing: R06.2

## 2015-03-25 HISTORY — DX: Respiratory disorder, unspecified: J98.9

## 2015-03-25 HISTORY — DX: Orthopnea: R06.01

## 2015-03-25 HISTORY — DX: Edema, unspecified: R60.9

## 2015-03-25 HISTORY — PX: CATARACT EXTRACTION W/PHACO: SHX586

## 2015-03-25 HISTORY — DX: Cardiac murmur, unspecified: R01.1

## 2015-03-25 HISTORY — DX: Gastro-esophageal reflux disease without esophagitis: K21.9

## 2015-03-25 SURGERY — PHACOEMULSIFICATION, CATARACT, WITH IOL INSERTION
Anesthesia: Monitor Anesthesia Care | Site: Eye | Laterality: Right | Wound class: Clean

## 2015-03-25 MED ORDER — NA CHONDROIT SULF-NA HYALURON 40-17 MG/ML IO SOLN
INTRAOCULAR | Status: DC | PRN
  Administered 2015-03-25: 1 mL via INTRAOCULAR

## 2015-03-25 MED ORDER — MOXIFLOXACIN HCL 0.5 % OP SOLN
1.0000 [drp] | OPHTHALMIC | Status: AC | PRN
  Administered 2015-03-25 (×3): 1 [drp] via OPHTHALMIC

## 2015-03-25 MED ORDER — MIDAZOLAM HCL 2 MG/2ML IJ SOLN
INTRAMUSCULAR | Status: DC | PRN
  Administered 2015-03-25: 2 mg via INTRAVENOUS

## 2015-03-25 MED ORDER — MOXIFLOXACIN HCL 0.5 % OP SOLN
OPHTHALMIC | Status: DC | PRN
  Administered 2015-03-25: 1 [drp] via OPHTHALMIC

## 2015-03-25 MED ORDER — CEFUROXIME OPHTHALMIC INJECTION 1 MG/0.1 ML
INJECTION | OPHTHALMIC | Status: AC
  Filled 2015-03-25: qty 0.1

## 2015-03-25 MED ORDER — CYCLOPENTOLATE HCL 2 % OP SOLN
OPHTHALMIC | Status: AC
  Administered 2015-03-25: 1 [drp] via OPHTHALMIC
  Filled 2015-03-25: qty 2

## 2015-03-25 MED ORDER — PHENYLEPHRINE HCL 10 % OP SOLN
OPHTHALMIC | Status: AC
  Administered 2015-03-25: 1 [drp] via OPHTHALMIC
  Filled 2015-03-25: qty 5

## 2015-03-25 MED ORDER — LIDOCAINE HCL (PF) 4 % IJ SOLN
INTRAMUSCULAR | Status: AC
  Filled 2015-03-25: qty 5

## 2015-03-25 MED ORDER — EPINEPHRINE HCL 1 MG/ML IJ SOLN
INTRAMUSCULAR | Status: AC
  Filled 2015-03-25: qty 2

## 2015-03-25 MED ORDER — FENTANYL CITRATE (PF) 100 MCG/2ML IJ SOLN
INTRAMUSCULAR | Status: DC | PRN
  Administered 2015-03-25 (×2): 50 ug via INTRAVENOUS

## 2015-03-25 MED ORDER — LIDOCAINE HCL 3.5 % OP GEL
Freq: Once | OPHTHALMIC | Status: AC
  Administered 2015-03-25: 08:00:00 via OPHTHALMIC

## 2015-03-25 MED ORDER — LIDOCAINE HCL (PF) 4 % IJ SOLN
INTRAOCULAR | Status: DC | PRN
  Administered 2015-03-25: .1 mL via OPHTHALMIC

## 2015-03-25 MED ORDER — LIDOCAINE HCL 2 % EX GEL: 1.0000 "application " | Freq: Once | CUTANEOUS | Status: DC

## 2015-03-25 MED ORDER — POVIDONE-IODINE 5 % OP SOLN
1.0000 "application " | OPHTHALMIC | Status: AC | PRN
  Administered 2015-03-25: 1 via OPHTHALMIC

## 2015-03-25 MED ORDER — TETRACAINE HCL 0.5 % OP SOLN
OPHTHALMIC | Status: AC
  Administered 2015-03-25: 1 [drp] via OPHTHALMIC
  Filled 2015-03-25: qty 2

## 2015-03-25 MED ORDER — EPINEPHRINE HCL 1 MG/ML IJ SOLN
INTRAOCULAR | Status: DC | PRN
  Administered 2015-03-25: 1 mL via OPHTHALMIC

## 2015-03-25 MED ORDER — CYCLOPENTOLATE HCL 2 % OP SOLN
1.0000 [drp] | OPHTHALMIC | Status: AC | PRN
  Administered 2015-03-25 (×4): 1 [drp] via OPHTHALMIC

## 2015-03-25 MED ORDER — MOXIFLOXACIN HCL 0.5 % OP SOLN
OPHTHALMIC | Status: AC
  Administered 2015-03-25: 1 [drp] via OPHTHALMIC
  Filled 2015-03-25: qty 3

## 2015-03-25 MED ORDER — NA CHONDROIT SULF-NA HYALURON 40-17 MG/ML IO SOLN
INTRAOCULAR | Status: AC
  Filled 2015-03-25: qty 1

## 2015-03-25 MED ORDER — SODIUM CHLORIDE 0.9 % IV SOLN
INTRAVENOUS | Status: DC
  Administered 2015-03-25: 08:00:00 via INTRAVENOUS

## 2015-03-25 MED ORDER — PHENYLEPHRINE HCL 10 % OP SOLN
1.0000 [drp] | OPHTHALMIC | Status: AC | PRN
  Administered 2015-03-25 (×4): 1 [drp] via OPHTHALMIC

## 2015-03-25 MED ORDER — CARBACHOL 0.01 % IO SOLN
INTRAOCULAR | Status: DC | PRN
  Administered 2015-03-25: .5 mL via INTRAOCULAR

## 2015-03-25 MED ORDER — POVIDONE-IODINE 5 % OP SOLN
OPHTHALMIC | Status: AC
  Administered 2015-03-25: 1 via OPHTHALMIC
  Filled 2015-03-25: qty 30

## 2015-03-25 MED ORDER — TETRACAINE HCL 0.5 % OP SOLN
1.0000 [drp] | OPHTHALMIC | Status: AC | PRN
  Administered 2015-03-25: 1 [drp] via OPHTHALMIC

## 2015-03-25 SURGICAL SUPPLY — 22 items
CANNULA ANT/CHMB 27GA (MISCELLANEOUS) ×2 IMPLANT
CUP MEDICINE 2OZ PLAST GRAD ST (MISCELLANEOUS) ×2 IMPLANT
GLOVE BIO SURGEON STRL SZ8 (GLOVE) ×2 IMPLANT
GLOVE BIOGEL M 6.5 STRL (GLOVE) ×2 IMPLANT
GLOVE SURG LX 8.0 MICRO (GLOVE) ×1
GLOVE SURG LX STRL 8.0 MICRO (GLOVE) ×1 IMPLANT
GOWN STRL REUS W/ TWL LRG LVL3 (GOWN DISPOSABLE) ×2 IMPLANT
GOWN STRL REUS W/TWL LRG LVL3 (GOWN DISPOSABLE) ×2
LENS IOL TECNIS 16.0 (Intraocular Lens) ×2 IMPLANT
LENS IOL TECNIS MONO 1P 16.0 (Intraocular Lens) ×1 IMPLANT
PACK CATARACT (MISCELLANEOUS) ×2 IMPLANT
PACK CATARACT BRASINGTON LX (MISCELLANEOUS) ×2 IMPLANT
PACK EYE AFTER SURG (MISCELLANEOUS) ×2 IMPLANT
SOL BSS BAG (MISCELLANEOUS) ×2
SOL PREP PVP 2OZ (MISCELLANEOUS) ×2
SOLUTION BSS BAG (MISCELLANEOUS) ×1 IMPLANT
SOLUTION PREP PVP 2OZ (MISCELLANEOUS) ×1 IMPLANT
SYR 3ML LL SCALE MARK (SYRINGE) ×2 IMPLANT
SYR 5ML LL (SYRINGE) ×2 IMPLANT
SYR TB 1ML 27GX1/2 LL (SYRINGE) ×2 IMPLANT
WATER STERILE IRR 1000ML POUR (IV SOLUTION) ×2 IMPLANT
WIPE NON LINTING 3.25X3.25 (MISCELLANEOUS) ×2 IMPLANT

## 2015-03-25 NOTE — OR Nursing (Signed)
Patient presented today with complaints of some discomfort in her chest that started  after she arrived to the hospital today.  Patient is currently on portable o2 at 2L for her COPD.  EKG performed Dr Pernell DupreAdams to review.  Dr Pernell DupreAdams aware that patient did not take Toprol today.  DR Pernell DupreAdams does not want to administer does at this time. Patient denies any chest pain at this time.

## 2015-03-25 NOTE — Anesthesia Postprocedure Evaluation (Signed)
Anesthesia Post Note  Patient: Diane [name]  Procedure(s) Performed: Procedure(s) (LRB): CATARACT EXTRACTION PHACO AND INTRAOCULAR LENS PLACEMENT (IOC) (Right)  Patient location during evaluation: Short Stay Anesthesia Type: MAC Level of consciousness: awake and alert Pain management: pain level controlled Vital Signs Assessment: post-procedure vital signs reviewed and stable Respiratory status: spontaneous breathing and respiratory function stable Cardiovascular status: blood pressure returned to baseline Postop Assessment: No headache Anesthetic complications: no    Last Vitals:  Filed Vitals:   03/25/15 0845 03/25/15 0847  BP: 165/94 165/94  Pulse: 62 66  Temp:  37.1 C  Resp: 16 18    Last Pain:  Filed Vitals:   03/25/15 0849  PainSc: 0-No pain                 Mathews ArgyleLogan,  Selah Klang P

## 2015-03-25 NOTE — Anesthesia Preprocedure Evaluation (Signed)
Anesthesia Evaluation  Patient identified by MRN, date of birth, ID band Patient awake    Reviewed: Allergy & Precautions, H&P , NPO status , Patient's Chart, lab work & pertinent test results, reviewed documented beta blocker date and time   Airway Mallampati: II  TM Distance: >3 FB Neck ROM: full    Dental no notable dental hx.    Pulmonary neg pulmonary ROS, shortness of breath, COPD,  oxygen dependent, former smoker,    Pulmonary exam normal breath sounds clear to auscultation       Cardiovascular Exercise Tolerance: Good hypertension, + Orthopnea  negative cardio ROS  + Valvular Problems/Murmurs  Rhythm:regular Rate:Normal  Initially complained of chest tightness..No other anginal equivalents.  EKG unremarkable.  Sx. Resolved spontaneously.   Neuro/Psych PSYCHIATRIC DISORDERS  Neuromuscular disease negative neurological ROS  negative psych ROS   GI/Hepatic negative GI ROS, Neg liver ROS, GERD  ,  Endo/Other  negative endocrine ROS  Renal/GU negative Renal ROS  negative genitourinary   Musculoskeletal   Abdominal   Peds  Hematology negative hematology ROS (+)   Anesthesia Other Findings   Reproductive/Obstetrics negative OB ROS                             Anesthesia Physical Anesthesia Plan  ASA: III  Anesthesia Plan: MAC   Post-op Pain Management:    Induction:   Airway Management Planned:   Additional Equipment:   Intra-op Plan:   Post-operative Plan:   Informed Consent: I have reviewed the patients History and Physical, chart, labs and discussed the procedure including the risks, benefits and alternatives for the proposed anesthesia with the patient or authorized representative who has indicated his/her understanding and acceptance.   Dental Advisory Given  Plan Discussed with: CRNA  Anesthesia Plan Comments:         Anesthesia Quick Evaluation

## 2015-03-25 NOTE — Op Note (Signed)
PREOPERATIVE DIAGNOSIS:  Nuclear sclerotic cataract of the right eye.   POSTOPERATIVE DIAGNOSIS:  nuclear sclerotic cataract right eye   OPERATIVE PROCEDURE: Procedure(s): CATARACT EXTRACTION PHACO AND INTRAOCULAR LENS PLACEMENT (IOC)   SURGEON:  Galen ManilaWilliam Shepherd Finnan, MD.   ANESTHESIA:  Anesthesiologist: Yevette EdwardsJames G Adams, MD CRNA: Mathews ArgyleBenjamin Logan, CRNA  1.      Managed anesthesia care. 2.      Topical tetracaine drops followed by 2% Xylocaine jelly applied in the preoperative holding area.   COMPLICATIONS:  None.   TECHNIQUE:   Stop and chop   DESCRIPTION OF PROCEDURE:  The patient was examined and consented in the preoperative holding area where the aforementioned topical anesthesia was applied to the right eye and then brought back to the Operating Room where the right eye was prepped and draped in the usual sterile ophthalmic fashion and a lid speculum was placed. A paracentesis was created with the side port blade and the anterior chamber was filled with viscoelastic. A near clear corneal incision was performed with the steel keratome. A continuous curvilinear capsulorrhexis was performed with a cystotome followed by the capsulorrhexis forceps. Hydrodissection and hydrodelineation were carried out with BSS on a blunt cannula. The lens was removed in a stop and chop  technique and the remaining cortical material was removed with the irrigation-aspiration handpiece. The capsular bag was inflated with viscoelastic and the Technis [id number]  lens was placed in the capsular bag without complication. The remaining viscoelastic was removed from the eye with the irrigation-aspiration handpiece. The wounds were hydrated. The anterior chamber was flushed with Miostat and the eye was inflated to physiologic pressure. 0.2 mL of Vigamox diluted three/one with BSS was placed in the anterior chamber. The wounds were found to be water tight. The eye was dressed with Vigamox. The patient was given protective glasses to  wear throughout the day and a shield with which to sleep tonight. The patient was also given drops with which to begin a drop regimen today and will follow-up with me in one day.  Implant Name Type Inv. Item Serial No. Manufacturer Lot No. LRB No. Used  tecnis aspheric lens     [id number]     Right 1   Procedure(s) with comments: CATARACT EXTRACTION PHACO AND INTRAOCULAR LENS PLACEMENT (IOC) (Right) - US 00:36 AP% 20.4 CDE 7.49 fluid pack lot # [id number]  Electronically signed: Judith Campillo LOUIS 03/25/2015 8:45 AM

## 2015-03-25 NOTE — H&P (Signed)
  All labs reviewed. Abnormal studies sent to patients PCP when indicated.  Previous H&P reviewed, patient examined, there are NO CHANGES.  Diane [name] LOUIS11/22/20168:14 AM

## 2015-03-25 NOTE — Telephone Encounter (Signed)
Order has been signed by BQ and faxed back to [facility], confirmation fax received.  Nothing further needed.

## 2015-03-25 NOTE — Discharge Instructions (Signed)
AMBULATORY SURGERY  °DISCHARGE INSTRUCTIONS ° ° °1) The drugs that you were given will stay in your system until tomorrow so for the next 24 hours you should not: ° °A) Drive an automobile °B) Make any legal decisions °C) Drink any alcoholic beverage ° ° °2) You may resume regular meals tomorrow.  Today it is better to start with liquids and gradually work up to solid foods. ° °You may eat anything you prefer, but it is better to start with liquids, then soup and crackers, and gradually work up to solid foods. ° ° °3) Please notify your doctor immediately if you have any unusual bleeding, trouble breathing, redness and pain at the surgery site, drainage, fever, or pain not relieved by medication. ° ° ° °4) Additional Instructions: ° ° ° °Eye Surgery Discharge Instructions ° °Expect mild scratchy sensation or mild soreness. °DO NOT RUB YOUR EYE! ° °The day of surgery: °• Minimal physical activity, but bed rest is not required °• No reading, computer work, or close hand work °• No bending, lifting, or straining. °• May watch TV ° °For 24 hours: °• No driving, legal decisions, or alcoholic beverages °• Safety precautions °• Eat anything you prefer: It is better to start with liquids, then soup then solid foods. °• _____ Eye patch should be worn until postoperative exam tomorrow. °• ____ Solar shield eyeglasses should be worn for comfort in the sunlight/patch while sleeping ° °Resume all regular medications including aspirin or Coumadin if these were discontinued prior to surgery. °You may shower, bathe, shave, or wash your hair. °Tylenol may be taken for mild discomfort. ° °Call your doctor if you experience significant pain, nausea, or vomiting, fever > 101 or other signs of infection. [phone] or [phone] °Specific instructions: ° ° ° ° ° °Please contact your physician with any problems or Same Day Surgery at [phone], Monday through Friday 6 am to 4 pm, or [facility] Main number at  [phone]. °

## 2015-03-25 NOTE — Transfer of Care (Signed)
Immediate Anesthesia Transfer of Care Note  Patient: Diane KeysPeggy A Pellot  Procedure(s) Performed: Procedure(s) with comments: CATARACT EXTRACTION PHACO AND INTRAOCULAR LENS PLACEMENT (IOC) (Right) - US 00:36 AP% 20.4 CDE 7.49 fluid pack lot # [id number]  Patient Location: Short Stay  Anesthesia Type:MAC  Level of Consciousness: awake  Airway & Oxygen Therapy: Patient Spontanous Breathing  Post-op Assessment: Report given to RN  Post vital signs: Reviewed  Last Vitals:  Filed Vitals:   03/25/15 0845 03/25/15 0847  BP: 165/94 165/94  Pulse: 62 66  Temp:  37.1 C  Resp: 16 18    Complications: No apparent anesthesia complications

## 2015-03-28 ENCOUNTER — Emergency Department: Payer: Managed Care, Other (non HMO)

## 2015-03-28 ENCOUNTER — Emergency Department
Admission: EM | Admit: 2015-03-28 | Discharge: 2015-03-28 | Disposition: A | Discharge status: Home / Self Care | Payer: Managed Care, Other (non HMO) | Attending: Emergency Medicine | Admitting: Emergency Medicine

## 2015-03-28 DIAGNOSIS — Z7951 Long term (current) use of inhaled steroids: Secondary | ICD-10-CM | POA: Insufficient documentation

## 2015-03-28 DIAGNOSIS — I10 Essential (primary) hypertension: Secondary | ICD-10-CM | POA: Insufficient documentation

## 2015-03-28 DIAGNOSIS — Z79899 Other long term (current) drug therapy: Secondary | ICD-10-CM | POA: Insufficient documentation

## 2015-03-28 DIAGNOSIS — Z9981 Dependence on supplemental oxygen: Secondary | ICD-10-CM | POA: Insufficient documentation

## 2015-03-28 DIAGNOSIS — Z87891 Personal history of nicotine dependence: Secondary | ICD-10-CM | POA: Insufficient documentation

## 2015-03-28 DIAGNOSIS — R000 Tachycardia, unspecified: Secondary | ICD-10-CM | POA: Insufficient documentation

## 2015-03-28 DIAGNOSIS — J441 Chronic obstructive pulmonary disease with (acute) exacerbation: Secondary | ICD-10-CM

## 2015-03-28 DIAGNOSIS — Z880 Allergy status to penicillin: Secondary | ICD-10-CM | POA: Diagnosis not present

## 2015-03-28 DIAGNOSIS — Z792 Long term (current) use of antibiotics: Secondary | ICD-10-CM | POA: Diagnosis not present

## 2015-03-28 DIAGNOSIS — R079 Chest pain, unspecified: Secondary | ICD-10-CM | POA: Diagnosis present

## 2015-03-28 LAB — CBC WITH DIFFERENTIAL/PLATELET
Basophils Absolute: 0.1 10*3/uL (ref 0–0.1)
Basophils Relative: 1 %
EOS ABS: 0.7 10*3/uL (ref 0–0.7)
Eosinophils Relative: 6 %
HEMATOCRIT: 41 % (ref 35.0–47.0)
HEMOGLOBIN: 13.6 g/dL (ref 12.0–16.0)
LYMPHS ABS: 2.1 10*3/uL (ref 1.0–3.6)
Lymphocytes Relative: 18 %
MCH: 29.2 pg (ref 26.0–34.0)
MCHC: 33.2 g/dL (ref 32.0–36.0)
MCV: 88 fL (ref 80.0–100.0)
MONOS PCT: 7 %
Monocytes Absolute: 0.8 10*3/uL (ref 0.2–0.9)
NEUTROS ABS: 7.9 10*3/uL — AB (ref 1.4–6.5)
NEUTROS PCT: 68 %
Platelets: 349 10*3/uL (ref 150–440)
RBC: 4.66 MIL/uL (ref 3.80–5.20)
RDW: 16.2 % — ABNORMAL HIGH (ref 11.5–14.5)
WBC: 11.5 10*3/uL — AB (ref 3.6–11.0)

## 2015-03-28 LAB — BASIC METABOLIC PANEL
ANION GAP: 10 (ref 5–15)
BUN: 10 mg/dL (ref 6–20)
CHLORIDE: 100 mmol/L — AB (ref 101–111)
CO2: 26 mmol/L (ref 22–32)
CREATININE: 0.62 mg/dL (ref 0.44–1.00)
Calcium: 10 mg/dL (ref 8.9–10.3)
GFR calc non Af Amer: >60 mL/min (ref >60)
Glucose, Bld: 119 mg/dL — ABNORMAL HIGH (ref 65–99)
POTASSIUM: 4 mmol/L (ref 3.5–5.1)
SODIUM: 136 mmol/L (ref 135–145)

## 2015-03-28 LAB — TROPONIN I

## 2015-03-28 MED ORDER — METOPROLOL SUCCINATE ER 50 MG PO TB24
50.0000 mg | ORAL_TABLET | Freq: Every day | ORAL | Status: DC
  Administered 2015-03-28: 50 mg via ORAL
  Filled 2015-03-28: qty 1

## 2015-03-28 MED ORDER — SERTRALINE HCL 100 MG PO TABS: 100.0000 mg | ORAL_TABLET | Freq: Every day | ORAL | Status: DC

## 2015-03-28 MED ORDER — PREDNISONE 10 MG PO TABS: 10.0000 mg | ORAL_TABLET | Freq: Every day | ORAL | Status: DC

## 2015-03-28 MED ORDER — SERTRALINE HCL 100 MG PO TABS
100.0000 mg | ORAL_TABLET | Freq: Every day | ORAL | Status: DC
  Administered 2015-03-28: 100 mg via ORAL
  Filled 2015-03-28: qty 1

## 2015-03-28 MED ORDER — METHYLPREDNISOLONE SODIUM SUCC 125 MG IJ SOLR
125.0000 mg | Freq: Once | INTRAMUSCULAR | Status: AC
  Administered 2015-03-28: 125 mg via INTRAVENOUS
  Filled 2015-03-28: qty 2

## 2015-03-28 MED ORDER — IPRATROPIUM-ALBUTEROL 0.5-2.5 (3) MG/3ML IN SOLN
RESPIRATORY_TRACT | Status: AC
  Filled 2015-03-28: qty 3

## 2015-03-28 MED ORDER — IPRATROPIUM-ALBUTEROL 0.5-2.5 (3) MG/3ML IN SOLN
3.0000 mL | Freq: Once | RESPIRATORY_TRACT | Status: AC
  Administered 2015-03-28: 3 mL via RESPIRATORY_TRACT
  Filled 2015-03-28: qty 3

## 2015-03-28 MED ORDER — METOPROLOL SUCCINATE ER 50 MG PO TB24: 50.0000 mg | ORAL_TABLET | Freq: Two times a day (BID) | ORAL | Status: DC

## 2015-03-28 MED ORDER — MIRTAZAPINE 7.5 MG PO TABS: 7.5000 mg | ORAL_TABLET | Freq: Every day | ORAL | Status: DC

## 2015-03-28 MED ORDER — IPRATROPIUM-ALBUTEROL 0.5-2.5 (3) MG/3ML IN SOLN
3.0000 mL | Freq: Once | RESPIRATORY_TRACT | Status: AC
  Administered 2015-03-28: 3 mL via RESPIRATORY_TRACT
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
## 2015-03-28 NOTE — Discharge Instructions (Signed)
You were evaluated for wheezing and found to have COPD exacerbation. You're being started on prednisone. I did refill your home medications. Return to the emergency department for any worsening trouble breathing, chest pain, fever, or any altered mental status.   Chronic Obstructive Pulmonary Disease Exacerbation Chronic obstructive pulmonary disease (COPD) is a common lung problem. In COPD, the flow of air from the lungs is limited. COPD exacerbations are times that breathing gets worse and you need extra treatment. Without treatment they can be life threatening. If they happen often, your lungs can become more damaged. If your COPD gets worse, your doctor may treat you with:  Medicines.  Oxygen.  Different ways to clear your airway, such as using a mask. HOME CARE  Do not smoke.  Avoid tobacco smoke and other things that bother your lungs.  If given, take your antibiotic medicine as told. Finish the medicine even if you start to feel better.  Only take medicines as told by your doctor.  Drink enough fluids to keep your pee (urine) clear or pale yellow (unless your doctor has told you not to).  Use a cool mist machine (vaporizer).  If you use oxygen or a machine that turns liquid medicine into a mist (nebulizer), continue to use them as told.  Keep up with shots (vaccinations) as told by your doctor.  Exercise regularly.  Eat healthy foods.  Keep all doctor visits as told. GET HELP RIGHT AWAY IF:  You are very short of breath and it gets worse.  You have trouble talking.  You have bad chest pain.  You have blood in your spit (sputum).  You have a fever.  You keep throwing up (vomiting).  You feel weak, or you pass out (faint).  You feel confused.  You keep getting worse. MAKE SURE YOU:  Understand these instructions.  Will watch your condition.  Will get help right away if you are not doing well or get worse.   This information is not intended to replace  advice given to you by your health care provider. Make sure you discuss any questions you have with your health care provider.   Document Released: 04/08/2011 Document Revised: 05/10/2014 Document Reviewed: 12/22/2012 Elsevier Interactive Patient Education [employer]2016 Elsevier Inc.

## 2015-03-28 NOTE — ED Notes (Signed)
Pt c/o being out of her 3 of her medications since last Friday, states she has had INcreased SOB since Wednesday with chest tightness .Marland Kitchen. Pt is on continuous 2L [place]..Marland Kitchen

## 2015-03-28 NOTE — ED Notes (Signed)
Walked patient ,patient states she's short winded o2 sat 90

## 2015-03-31 NOTE — Telephone Encounter (Signed)
No antibiotic until seen 12/02  Diane Fischeravid Simonds, MD PCCM service Mobile [phone] Pager [phone]

## 2015-03-31 NOTE — Telephone Encounter (Signed)
Pt states she went to ED on Friday for breathing issues. Was given Prednisone in ED and was given 50 mg daily x 4 days. States she has been taking Mucinex. She is coughing up green mucus when it comes up. She does have an appt on Wed. Asking if you want to call in an abx for her. Please advise.

## 2015-03-31 NOTE — Telephone Encounter (Signed)
Patient no longer needs meds bc went to ED.  [facility] she couldn't come in for appt until Wednesday but Dr. Sung AmabileSimonds may want her on ABX as she is coughing up green mucus.  Please call Patient.

## 2015-04-01 ENCOUNTER — Other Ambulatory Visit: Payer: Self-pay | Admitting: Ophthalmology

## 2015-04-01 NOTE — Telephone Encounter (Signed)
Pt informed. Has appt on Wed @12pm . Nothing further needed.

## 2015-04-02 ENCOUNTER — Encounter: Payer: Self-pay | Admitting: Pulmonary Disease

## 2015-04-02 ENCOUNTER — Ambulatory Visit (INDEPENDENT_AMBULATORY_CARE_PROVIDER_SITE_OTHER): Payer: Managed Care, Other (non HMO) | Admitting: Pulmonary Disease

## 2015-04-02 VITALS — BP 142/82 | HR 91 | Wt 139.0 lb

## 2015-04-02 DIAGNOSIS — J438 Other emphysema: Secondary | ICD-10-CM

## 2015-04-02 DIAGNOSIS — J441 Chronic obstructive pulmonary disease with (acute) exacerbation: Secondary | ICD-10-CM

## 2015-04-02 MED ORDER — DOXYCYCLINE HYCLATE 100 MG PO TABS: 100.0000 mg | ORAL_TABLET | Freq: Two times a day (BID) | ORAL | Status: DC

## 2015-04-03 NOTE — Progress Notes (Signed)
PROBLEMS: Severe COPD  INTERVAL HISTORY: Seen in ED 03/28/15 with increased dyspnea, purulent sputum. Treated with nebulized BDs, discharged with Rx for pred 50 mg X 4 days which she has completed. She was not prescribed antibiotics  SUBJ: Improved some since 11/25 but continues to have yellow-green mucus. Denies fever, CP, hemoptysis  OBJ: Filed Vitals:   04/02/15 1204  BP: 142/82  Pulse: 91  Weight: 139 lb (63.05 kg)  SpO2: 96%   NAD HEENT WNL Diminished BS, no wheezes or other adventitious sounds RRR s M NABS, soft No C/C/E  DATA: CXR 03/28/15: hyperinflated, NAD  CBC Latest Ref Rng 03/28/2015 01/30/2015 01/28/2015  WBC 3.6 - 11.0 K/uL 11.5(H) 6.7 6.9  Hemoglobin 12.0 - 16.0 g/dL 16.113.6 10.5(L) 11.8(L)  Hematocrit 35.0 - 47.0 % 41.0 30.9(L) 35.2  Platelets 150 - 440 K/uL 349 263 254      IMPRESSION: Severe COPD - emphysema and chronic bronchitis Acute COPD exacerbation with purulent bronchitis  PLAN: Doxycycline 100 mg BID X 7 days Cont COPD regimen as documented Referral to pulmonary rehab  ROV 2-4 weeks    Merwyn Katosavid B Simonds, MD [facility]

## 2015-04-04 ENCOUNTER — Ambulatory Visit: Payer: Managed Care, Other (non HMO) | Admitting: Pulmonary Disease

## 2015-04-08 NOTE — Pre-Procedure Instructions (Signed)
Continuous use of oxygen at 2 liters.

## 2015-04-14 ENCOUNTER — Encounter: Payer: Self-pay | Admitting: Pulmonary Disease

## 2015-04-14 ENCOUNTER — Ambulatory Visit (INDEPENDENT_AMBULATORY_CARE_PROVIDER_SITE_OTHER): Payer: Managed Care, Other (non HMO) | Admitting: Pulmonary Disease

## 2015-04-14 VITALS — BP 142/82 | HR 104 | Ht 67.0 in | Wt 138.0 lb

## 2015-04-14 DIAGNOSIS — J441 Chronic obstructive pulmonary disease with (acute) exacerbation: Secondary | ICD-10-CM

## 2015-04-14 DIAGNOSIS — J438 Other emphysema: Secondary | ICD-10-CM

## 2015-04-14 MED ORDER — FLUTICASONE FUROATE-VILANTEROL 200-25 MCG/INH IN AEPB: 1.0000 | INHALATION_SPRAY | Freq: Every day | RESPIRATORY_TRACT | Status: AC

## 2015-04-14 MED ORDER — UMECLIDINIUM BROMIDE 62.5 MCG/INH IN AEPB: 1.0000 | INHALATION_SPRAY | Freq: Every day | RESPIRATORY_TRACT | Status: AC

## 2015-04-14 MED ORDER — PREDNISONE 20 MG PO TABS: 20.0000 mg | ORAL_TABLET | Freq: Every day | ORAL | Status: DC

## 2015-04-14 NOTE — Progress Notes (Signed)
PROBLEMS: Severe COPD  INTERVAL HISTORY: Seen in ED 03/28/15 with increased dyspnea, purulent sputum. Treated with nebulized BDs, discharged with Rx for pred 50 mg X 4 days which she has completed. She was not prescribed antibiotics Office visit 11/30: purulent sputum, no wheezing. Treated with Doxycycline X 7 days Office visit 04/14/15: still coughing. Nonproductive. Increased dyspnea with wheezing noted on exam. Prednisone taper ordered. Follow up with DK scheduled for 12/22  SUBJ: Increased SOB, DOE. Still coughing but now nonproductive. No fevers, CP, hemoptysis, LE edema or calf tenderness  OBJ: Filed Vitals:   04/14/15 0954  BP: 142/82  Pulse: 104  Height: 5\' 7"  (1.702 m)  Weight: 138 lb (62.596 kg)  SpO2: 92%   NAD HEENT WNL Diminished BS, scattered wheezes RRR s M NABS, soft No C/C/E  DATA:  CBC Latest Ref Rng 03/28/2015 01/30/2015 01/28/2015  WBC 3.6 - 11.0 K/uL 11.5(H) 6.7 6.9  Hemoglobin 12.0 - 16.0 g/dL 16.113.6 10.5(L) 11.8(L)  Hematocrit 35.0 - 47.0 % 41.0 30.9(L) 35.2  Platelets 150 - 440 K/uL 349 263 254   No new CXR   IMPRESSION: Severe COPD - emphysema and chronic bronchitis Acute COPD exacerbation, now with wheezing  PLAN: Cont COPD regimen as documented Prednsione 60 mg daily X 3, 40 mg daily X 3, 20 mg daily X 3, stop Referral to pulmonary rehab has been made Samples of Breo and Incruse provided 12/12 ROV 12/22 with [facility]K for quick recheck to ensure resolution of current symptoms   Merwyn Katosavid B Brodyn Depuy, MD [facility]M

## 2015-04-14 NOTE — Addendum Note (Signed)
Addended by: Meyer CoryAHMAD, Arriana Lohmann R on: 04/14/2015 10:33 AM   Modules accepted: Orders

## 2015-04-14 NOTE — Patient Instructions (Signed)
Prednisone 20 mg -  3 pills daily X 3, then 2 pills daily X 3, then 1 pill daily X 3, then stop

## 2015-04-15 ENCOUNTER — Encounter: Admission: RE | Payer: Self-pay | Source: Ambulatory Visit

## 2015-04-15 ENCOUNTER — Ambulatory Visit
Admission: RE | Admit: 2015-04-15 | Payer: Managed Care, Other (non HMO) | Source: Ambulatory Visit | Admitting: Ophthalmology

## 2015-04-15 HISTORY — DX: Sleep apnea, unspecified: G47.30

## 2015-04-15 HISTORY — DX: Pneumonia, unspecified organism: J18.9

## 2015-04-15 HISTORY — DX: Cardiac murmur, unspecified: R01.1

## 2015-04-15 HISTORY — DX: Headache: R51

## 2015-04-15 HISTORY — DX: Tremor, unspecified: R25.1

## 2015-04-15 HISTORY — DX: Bronchitis, not specified as acute or chronic: J40

## 2015-04-15 HISTORY — DX: Localized edema: R60.0

## 2015-04-15 HISTORY — DX: Headache, unspecified: R51.9

## 2015-04-15 HISTORY — DX: Other allergy status, other than to drugs and biological substances: Z91.09

## 2015-04-15 SURGERY — PHACOEMULSIFICATION, CATARACT, WITH IOL INSERTION
Anesthesia: Choice | Laterality: Left

## 2015-04-22 ENCOUNTER — Other Ambulatory Visit: Payer: Self-pay | Admitting: Internal Medicine

## 2015-04-22 NOTE — Telephone Encounter (Signed)
Please advise 

## 2015-04-24 ENCOUNTER — Ambulatory Visit (INDEPENDENT_AMBULATORY_CARE_PROVIDER_SITE_OTHER): Payer: Managed Care, Other (non HMO) | Admitting: Internal Medicine

## 2015-04-24 ENCOUNTER — Encounter: Payer: Self-pay | Admitting: Internal Medicine

## 2015-04-24 VITALS — BP 138/76 | HR 72 | Ht 66.0 in | Wt 142.2 lb

## 2015-04-24 DIAGNOSIS — J449 Chronic obstructive pulmonary disease, unspecified: Secondary | ICD-10-CM | POA: Diagnosis not present

## 2015-04-24 DIAGNOSIS — F4323 Adjustment disorder with mixed anxiety and depressed mood: Secondary | ICD-10-CM

## 2015-04-24 MED ORDER — SERTRALINE HCL 100 MG PO TABS: 100.0000 mg | ORAL_TABLET | Freq: Every day | ORAL | Status: DC

## 2015-04-24 MED ORDER — BUDESONIDE 0.5 MG/2ML IN SUSP: 0.5000 mg | Freq: Two times a day (BID) | RESPIRATORY_TRACT | Status: DC

## 2015-04-24 MED ORDER — MIRTAZAPINE 7.5 MG PO TABS: 7.5000 mg | ORAL_TABLET | Freq: Every day | ORAL | Status: DC

## 2015-04-24 NOTE — Patient Instructions (Signed)
Chronic Obstructive Pulmonary Disease Chronic obstructive pulmonary disease (COPD) is a common lung condition in which airflow from the lungs is limited. COPD is a general term that can be used to describe many different lung problems that limit airflow, including both chronic bronchitis and emphysema. If you have COPD, your lung function will probably never return to normal, but there are measures you can take to improve lung function and make yourself feel better. CAUSES   Smoking (common).  Exposure to secondhand smoke.  Genetic problems.  Chronic inflammatory lung diseases or recurrent infections. SYMPTOMS  Shortness of breath, especially with physical activity.  Deep, persistent (chronic) cough with a large amount of thick mucus.  Wheezing.  Rapid breaths (tachypnea).  Gray or bluish discoloration (cyanosis) of the skin, especially in your fingers, toes, or lips.  Fatigue.  Weight loss.  Frequent infections or episodes when breathing symptoms become much worse (exacerbations).  Chest tightness. DIAGNOSIS Your health care provider will take a medical history and perform a physical examination to diagnose COPD. Additional tests for COPD may include:  Lung (pulmonary) function tests.  Chest X-ray.  CT scan.  Blood tests. TREATMENT  Treatment for COPD may include:  Inhaler and nebulizer medicines. These help manage the symptoms of COPD and make your breathing more comfortable.  Supplemental oxygen. Supplemental oxygen is only helpful if you have a low oxygen level in your blood.  Exercise and physical activity. These are beneficial for nearly all people with COPD.  Lung surgery or transplant.  Nutrition therapy to gain weight, if you are underweight.  Pulmonary rehabilitation. This may involve working with a team of health care providers and specialists, such as respiratory, occupational, and physical therapists. HOME CARE INSTRUCTIONS  Take all medicines  (inhaled or pills) as directed by your health care provider.  Avoid over-the-counter medicines or cough syrups that dry up your airway (such as antihistamines) and slow down the elimination of secretions unless instructed otherwise by your health care provider.  If you are a smoker, the most important thing that you can do is stop smoking. Continuing to smoke will cause further lung damage and breathing trouble. Ask your health care provider for help with quitting smoking. He or she can direct you to community resources or hospitals that provide support.  Avoid exposure to irritants such as smoke, chemicals, and fumes that aggravate your breathing.  Use oxygen therapy and pulmonary rehabilitation if directed by your health care provider. If you require home oxygen therapy, ask your health care provider whether you should purchase a pulse oximeter to measure your oxygen level at home.  Avoid contact with individuals who have a contagious illness.  Avoid extreme temperature and humidity changes.  Eat healthy foods. Eating smaller, more frequent meals and resting before meals may help you maintain your strength.  Stay active, but balance activity with periods of rest. Exercise and physical activity will help you maintain your ability to do things you want to do.  Preventing infection and hospitalization is very important when you have COPD. Make sure to receive all the vaccines your health care provider recommends, especially the pneumococcal and influenza vaccines. Ask your health care provider whether you need a pneumonia vaccine.  Learn and use relaxation techniques to manage stress.  Learn and use controlled breathing techniques as directed by your health care provider. Controlled breathing techniques include:  Pursed lip breathing. Start by breathing in (inhaling) through your nose for 1 second. Then, purse your lips as if you were   going to whistle and breathe out (exhale) through the  pursed lips for 2 seconds.  Diaphragmatic breathing. Start by putting one hand on your abdomen just above your waist. Inhale slowly through your nose. The hand on your abdomen should move out. Then purse your lips and exhale slowly. You should be able to feel the hand on your abdomen moving in as you exhale.  Learn and use controlled coughing to clear mucus from your lungs. Controlled coughing is a series of short, progressive coughs. The steps of controlled coughing are: 1. Lean your head slightly forward. 2. Breathe in deeply using diaphragmatic breathing. 3. Try to hold your breath for 3 seconds. 4. Keep your mouth slightly open while coughing twice. 5. Spit any mucus out into a tissue. 6. Rest and repeat the steps once or twice as needed. SEEK MEDICAL CARE IF:  You are coughing up more mucus than usual.  There is a change in the color or thickness of your mucus.  Your breathing is more labored than usual.  Your breathing is faster than usual. SEEK IMMEDIATE MEDICAL CARE IF:  You have shortness of breath while you are resting.  You have shortness of breath that prevents you from:  Being able to talk.  Performing your usual physical activities.  You have chest pain lasting longer than 5 minutes.  Your skin color is more cyanotic than usual.  You measure low oxygen saturations for longer than 5 minutes with a pulse oximeter. MAKE SURE YOU:  Understand these instructions.  Will watch your condition.  Will get help right away if you are not doing well or get worse.   This information is not intended to replace advice given to you by your health care provider. Make sure you discuss any questions you have with your health care provider.   Document Released: 01/27/2005 Document Revised: 05/10/2014 Document Reviewed: 12/14/2012 Elsevier Interactive Patient Education 2016 Elsevier Inc.  

## 2015-04-24 NOTE — Progress Notes (Signed)
[facility] Pulmonary Medicine Consultation     Date: 04/24/2015,   MRN# [id number] Diane [name] [birth year] Code Status:  Hosp day:@LENGTHOFSTAYDAYS @ Referring MD: @     PCP:      AdmissionWeight: 142 lb 3.2 oz (64.501 kg)                 CurrentWeight: 142 lb 3.2 oz (64.501 kg) Diane [name] is a 59 y.o. old female seen in consultation for acute COPD exacerbation  PT PROFILE: Hospitalization 12/25/14 - 01/03/15 for AECOPD requiring intubation followed by [facility] and rehab facility. Course has been complicated by anorexia, nausea, wt loss, depression. Chronic oxygen therapy, previous h/o intubation for COPD exacerbation   CC: SOB and wheezing  Previous recent history: Seen in ED 03/28/15 with increased dyspnea, purulent sputum. Treated with nebulized BDs, discharged with Rx for pred 50 mg X 4 days which she has completed. She was not prescribed antibiotics Office visit 11/30: purulent sputum, no wheezing. Treated with Doxycycline X 7 days Office visit 04/14/15: still coughing. Nonproductive. Increased dyspnea with wheezing noted on exam. Prednisone taper ordered.     HPI: follow up today for persist ant WOB,SOB,wheezing but better today Feels much better since last visit 04/14/15  Patient denies fevers, on Breo/INCRUSE Albuterol every 6 hrs    Current outpatient prescriptions:  .  albuterol (PROVENTIL) (2.5 MG/3ML) 0.083% nebulizer solution, every 4 (four) hours as needed. , Disp: , Rfl:  .  Cholecalciferol (VITAMIN D-3 PO), Take 2,000 Units by mouth daily., Disp: , Rfl:  .  clonazePAM (KLONOPIN) 0.5 MG tablet, TAKE ONE TABLET BY MOUTH TWICE DAILY AS NEEDED FOR ANXIETY, Disp: 60 tablet, Rfl: 0 .  doxycycline (VIBRA-TABS) 100 MG tablet, Take 1 tablet (100 mg total) by mouth 2 (two) times daily., Disp: 14 tablet, Rfl: 0 .  Eszopiclone (ESZOPICLONE) 3 MG TABS, Take 3 mg by mouth at bedtime., Disp: , Rfl:  .  famotidine (PEPCID) 20 MG tablet, Take 20 mg by mouth 2 (two)  times daily., Disp: , Rfl:  .  Fluticasone Furoate-Vilanterol (BREO ELLIPTA) 200-25 MCG/INH AEPB, Inhale 1 puff into the lungs daily., Disp: 60 each, Rfl: 5 .  ipratropium-albuterol (DUONEB) 0.5-2.5 (3) MG/3ML SOLN, Take 3 mLs by nebulization every 4 (four) hours as needed., Disp: 360 mL, Rfl: 4 .  Loratadine 10 MG CAPS, Take 1 capsule by mouth daily. In am., Disp: , Rfl:  .  Melatonin 5 MG TABS, Take 5 mg by mouth at bedtime., Disp: , Rfl:  .  metoprolol succinate (TOPROL XL) 50 MG 24 hr tablet, Take 1 tablet (50 mg total) by mouth 2 (two) times daily. Take with or immediately following a meal., Disp: 60 tablet, Rfl: 0 .  mirtazapine (REMERON) 7.5 MG tablet, Take 1 tablet (7.5 mg total) by mouth at bedtime., Disp: 30 tablet, Rfl: 0 .  Multiple Vitamin (MULTIVITAMIN) capsule, Take 1 capsule by mouth daily., Disp: , Rfl:  .  senna (SENOKOT) 8.6 MG tablet, Take 1 tablet by mouth daily., Disp: , Rfl:  .  sertraline (ZOLOFT) 100 MG tablet, Take 1 tablet (100 mg total) by mouth daily., Disp: 30 tablet, Rfl: 0 .  VENTOLIN HFA 108 (90 BASE) MCG/ACT inhaler, , Disp: , Rfl:  .  budesonide (PULMICORT) 0.5 MG/2ML nebulizer solution, Take 2 mLs (0.5 mg total) by nebulization 2 (two) times daily., Disp: 2 mL, Rfl: 60     ALLERGIES   Aspirin; Levaquin; Levofloxacin; and Penicillins     REVIEW OF SYSTEMS  Review of Systems  Constitutional: Negative for fever, chills and malaise/fatigue.  Respiratory: Positive for cough, shortness of breath and wheezing.   Cardiovascular: Negative for chest pain and palpitations.  Psychiatric/Behavioral: The patient is nervous/anxious.      VS: BP 138/76 mmHg  Pulse 72  Ht 5\' 6"  (1.676 m)  Wt 142 lb 3.2 oz (64.501 kg)  BMI 22.96 kg/m2  SpO2 94%     PHYSICAL EXAM   Physical Exam  Constitutional: She is oriented to person, place, and time. No distress.  HENT:  Head: Normocephalic and atraumatic.  Cardiovascular: Normal rate and regular rhythm.   No  murmur heard. Pulmonary/Chest: No respiratory distress. She has wheezes.  Neurological: She is alert and oriented to person, place, and time.  Skin: She is not diaphoretic.     ASSESSMENT/PLAN   59 yo oxygen dependant COPD with severe end stage COPD. Grade D  1.continue Breo/Incruse 2.change to Albuterol every 4 hrs 3.add pulmicort nebs 4. No need for abx and steroids at this time   follow up in 3 months    Patient satisfied with Plan of action and management. All questions answered   Lucie LeatherKurian David Khianna Blazina, M.D.  [facility]  Medical Director [facility] Medical Director [facility]

## 2015-04-24 NOTE — Addendum Note (Signed)
Addended by: Meyer CoryAHMAD, Navjot Pilgrim R on: 04/24/2015 01:57 PM   Modules accepted: Orders

## 2015-04-25 ENCOUNTER — Telehealth: Payer: Self-pay

## 2015-04-25 MED ORDER — ALBUTEROL SULFATE (2.5 MG/3ML) 0.083% IN NEBU: 2.5000 mg | INHALATION_SOLUTION | RESPIRATORY_TRACT | Status: DC | PRN

## 2015-04-25 NOTE — Telephone Encounter (Signed)
Rx refilled for Albuterol 0.083% nebulized solution

## 2015-04-30 ENCOUNTER — Telehealth: Payer: Self-pay | Admitting: *Deleted

## 2015-04-30 NOTE — Telephone Encounter (Signed)
Pharmacy calling needing correct quanity sent in for PULMICORT to [facility] on [address].

## 2015-04-30 NOTE — Telephone Encounter (Signed)
Gave correct amount. Provider sent over wrong amount (2ml). Nothing further needed.

## 2015-05-01 ENCOUNTER — Telehealth: Payer: Self-pay | Admitting: *Deleted

## 2015-05-01 ENCOUNTER — Other Ambulatory Visit
Admission: RE | Admit: 2015-05-01 | Discharge: 2015-05-01 | Disposition: A | Discharge status: Home / Self Care | Payer: Managed Care, Other (non HMO) | Source: Ambulatory Visit | Attending: Internal Medicine | Admitting: Internal Medicine

## 2015-05-01 DIAGNOSIS — J440 Chronic obstructive pulmonary disease with acute lower respiratory infection: Secondary | ICD-10-CM

## 2015-05-01 MED ORDER — DOXYCYCLINE HYCLATE 100 MG PO TABS: 100.0000 mg | ORAL_TABLET | Freq: Two times a day (BID) | ORAL | Status: DC

## 2015-05-01 MED ORDER — PREDNISONE 20 MG PO TABS: ORAL_TABLET | ORAL | Status: DC

## 2015-05-01 NOTE — Telephone Encounter (Signed)
Please refill last prednisone and doxycycline meds And obtain sputum culture ASAP

## 2015-05-01 NOTE — Telephone Encounter (Signed)
Pt informed of recommendations. Nothing further needed.

## 2015-05-01 NOTE — Telephone Encounter (Signed)
Pt called stating she is still having SOB and having a cough, productive at times with green mucus and is congested. States she would be able to come in today if needed but we have no provider in office today and she has no ride tomorrow to come in. Please advise.   FYI: you seen pt last on 04/24/15 for a f/u for her COPD exacerbation. On 04/14/15 DS gave her a 9 day Prednisone taper and on 04/02/15 DS gave her Doxycycline 100mg  BID x 7days.

## 2015-05-02 ENCOUNTER — Telehealth: Payer: Self-pay | Admitting: Internal Medicine

## 2015-05-02 NOTE — Telephone Encounter (Signed)
Please call patient has ? About sputum cx.   Unable to obtain .

## 2015-05-02 NOTE — Telephone Encounter (Signed)
Spoke with pt and she states when she tried to give specimen in lab yesterday they told her it was to much spit and not enough specimen and sent a cup home with her. Just spoke with pt and she said she is unable to obtain a specimen so I informed pt to go ahead and start the abx and prednisone and not to worry about the specimen at this time since she is starting the abx. Please advise if you prefer any thing different.

## 2015-05-02 NOTE — Telephone Encounter (Signed)
nope

## 2015-05-15 ENCOUNTER — Telehealth: Payer: Self-pay | Admitting: *Deleted

## 2015-05-15 NOTE — Telephone Encounter (Signed)
Received message from pharmacy stating Incruse not covered by [employer] and wants to know if we will change medication. We also started pt on Breo 200. Please advise.

## 2015-05-16 MED ORDER — ACLIDINIUM BROMIDE 400 MCG/ACT IN AEPB: 1.0000 | INHALATION_SPRAY | Freq: Two times a day (BID) | RESPIRATORY_TRACT | Status: DC

## 2015-05-16 NOTE — Telephone Encounter (Signed)
tudorza 1 puff BID

## 2015-05-16 NOTE — Telephone Encounter (Signed)
Pt informed of medication change. Nothing further needed. 

## 2015-05-19 ENCOUNTER — Encounter: Payer: Self-pay | Admitting: Internal Medicine

## 2015-05-19 ENCOUNTER — Other Ambulatory Visit (HOSPITAL_COMMUNITY)
Admission: RE | Admit: 2015-05-19 | Discharge: 2015-05-19 | Disposition: A | Discharge status: Home / Self Care | Payer: Managed Care, Other (non HMO) | Source: Ambulatory Visit | Attending: Internal Medicine | Admitting: Internal Medicine

## 2015-05-19 ENCOUNTER — Ambulatory Visit (INDEPENDENT_AMBULATORY_CARE_PROVIDER_SITE_OTHER): Payer: Managed Care, Other (non HMO) | Admitting: Internal Medicine

## 2015-05-19 VITALS — BP 158/96 | HR 81 | Temp 98.1°F | Resp 12 | Ht 65.0 in | Wt 145.8 lb

## 2015-05-19 DIAGNOSIS — E559 Vitamin D deficiency, unspecified: Secondary | ICD-10-CM | POA: Diagnosis not present

## 2015-05-19 DIAGNOSIS — Z124 Encounter for screening for malignant neoplasm of cervix: Secondary | ICD-10-CM | POA: Diagnosis not present

## 2015-05-19 DIAGNOSIS — Z1151 Encounter for screening for human papillomavirus (HPV): Secondary | ICD-10-CM | POA: Diagnosis not present

## 2015-05-19 DIAGNOSIS — R5383 Other fatigue: Secondary | ICD-10-CM

## 2015-05-19 DIAGNOSIS — Z0000 Encounter for general adult medical examination without abnormal findings: Secondary | ICD-10-CM

## 2015-05-19 DIAGNOSIS — F4323 Adjustment disorder with mixed anxiety and depressed mood: Secondary | ICD-10-CM | POA: Diagnosis not present

## 2015-05-19 DIAGNOSIS — E785 Hyperlipidemia, unspecified: Secondary | ICD-10-CM

## 2015-05-19 DIAGNOSIS — Z1239 Encounter for other screening for malignant neoplasm of breast: Secondary | ICD-10-CM

## 2015-05-19 DIAGNOSIS — I10 Essential (primary) hypertension: Secondary | ICD-10-CM

## 2015-05-19 DIAGNOSIS — J432 Centrilobular emphysema: Secondary | ICD-10-CM

## 2015-05-19 DIAGNOSIS — Z01411 Encounter for gynecological examination (general) (routine) with abnormal findings: Secondary | ICD-10-CM | POA: Diagnosis present

## 2015-05-19 LAB — LIPID PANEL
CHOL/HDL RATIO: 5
CHOLESTEROL: 364 mg/dL — AB (ref 0–200)
HDL: 80.4 mg/dL (ref >39.00)
NonHDL: 283.99
TRIGLYCERIDES: 217 mg/dL — AB (ref 0.0–149.0)
VLDL: 43.4 mg/dL — AB (ref 0.0–40.0)

## 2015-05-19 LAB — COMPREHENSIVE METABOLIC PANEL
ALT: 21 U/L (ref 0–35)
AST: 22 U/L (ref 0–37)
Albumin: 4.6 g/dL (ref 3.5–5.2)
Alkaline Phosphatase: 61 U/L (ref 39–117)
BUN: 8 mg/dL (ref 6–23)
CO2: 25 meq/L (ref 19–32)
Calcium: 10.1 mg/dL (ref 8.4–10.5)
Chloride: 97 mEq/L (ref 96–112)
Creatinine, Ser: 0.72 mg/dL (ref 0.40–1.20)
GFR: 88.28 mL/min (ref >60.00)
GLUCOSE: 72 mg/dL (ref 70–99)
POTASSIUM: 4.1 meq/L (ref 3.5–5.1)
Sodium: 139 mEq/L (ref 135–145)
Total Bilirubin: 0.3 mg/dL (ref 0.2–1.2)
Total Protein: 7.3 g/dL (ref 6.0–8.3)

## 2015-05-19 LAB — TSH: TSH: 3.18 u[IU]/mL (ref 0.35–4.50)

## 2015-05-19 LAB — VITAMIN D 25 HYDROXY (VIT D DEFICIENCY, FRACTURES): VITD: 26.22 ng/mL — ABNORMAL LOW (ref 30.00–100.00)

## 2015-05-19 MED ORDER — SERTRALINE HCL 100 MG PO TABS: 100.0000 mg | ORAL_TABLET | Freq: Every day | ORAL | Status: DC

## 2015-05-19 MED ORDER — MIRTAZAPINE 15 MG PO TABS: 15.0000 mg | ORAL_TABLET | Freq: Every day | ORAL | Status: DC

## 2015-05-19 MED ORDER — METOPROLOL SUCCINATE ER 50 MG PO TB24
50.0000 mg | ORAL_TABLET | Freq: Two times a day (BID) | ORAL | Status: DC
Start: 2015-05-19 — End: 2015-10-15

## 2015-05-19 MED ORDER — ESZOPICLONE 3 MG PO TABS: 3.0000 mg | ORAL_TABLET | Freq: Every day | ORAL | Status: DC

## 2015-05-19 MED ORDER — CLONAZEPAM 0.5 MG PO TABS: 0.5000 mg | ORAL_TABLET | Freq: Two times a day (BID) | ORAL | Status: DC | PRN

## 2015-05-19 NOTE — Patient Instructions (Addendum)
Increase your metoprolol to  Two times daily for blood pressure and pulse  Check BP in one week,  If not < 130/80, let me know.  We may add a different medication.   The cardiopulmonary rehab program is excellent!!!      We increasing the mirtazipine to 15 mg at bedtime for more energy   Menopause is a normal process in which your reproductive ability comes to an end. This process happens gradually over a span of months to years, usually between the ages of 80 and 85. Menopause is complete when you have missed 12 consecutive menstrual periods. It is important to talk with your health care provider about some of the most common conditions that affect postmenopausal women, such as heart disease, cancer, and bone loss (osteoporosis). Adopting a healthy lifestyle and getting preventive care can help to promote your health and wellness. Those actions can also lower your chances of developing some of these common conditions. WHAT SHOULD I KNOW ABOUT MENOPAUSE? During menopause, you may experience a number of symptoms, such as:  Moderate-to-severe hot flashes.  Night sweats.  Decrease in sex drive.  Mood swings.  Headaches.  Tiredness.  Irritability.  Memory problems.  Insomnia. Choosing to treat or not to treat menopausal changes is an individual decision that you make with your health care provider. WHAT SHOULD I KNOW ABOUT HORMONE REPLACEMENT THERAPY AND SUPPLEMENTS? Hormone therapy products are effective for treating symptoms that are associated with menopause, such as hot flashes and night sweats. Hormone replacement carries certain risks, especially as you become older. If you are thinking about using estrogen or estrogen with progestin treatments, discuss the benefits and risks with your health care provider. WHAT SHOULD I KNOW ABOUT HEART DISEASE AND STROKE? Heart disease, heart attack, and stroke become more likely as you age. This may be due, in part, to the hormonal changes  that your body experiences during menopause. These can affect how your body processes dietary fats, triglycerides, and cholesterol. Heart attack and stroke are both medical emergencies. There are many things that you can do to help prevent heart disease and stroke:  Have your blood pressure checked at least every 1-2 years. High blood pressure causes heart disease and increases the risk of stroke.  If you are 10-23 years old, ask your health care provider if you should take aspirin to prevent a heart attack or a stroke.  Do not use any tobacco products, including cigarettes, chewing tobacco, or electronic cigarettes. If you need help quitting, ask your health care provider.  It is important to eat a healthy diet and maintain a healthy weight.  Be sure to include plenty of vegetables, fruits, low-fat dairy products, and lean protein.  Avoid eating foods that are high in solid fats, added sugars, or salt (sodium).  Get regular exercise. This is one of the most important things that you can do for your health.  Try to exercise for at least 150 minutes each week. The type of exercise that you do should increase your heart rate and make you sweat. This is known as moderate-intensity exercise.  Try to do strengthening exercises at least twice each week. Do these in addition to the moderate-intensity exercise.  Know your numbers.Ask your health care provider to check your cholesterol and your blood glucose. Continue to have your blood tested as directed by your health care provider. WHAT SHOULD I KNOW ABOUT CANCER SCREENING? There are several types of cancer. Take the following steps to reduce your  risk and to catch any cancer development as early as possible. Breast Cancer  Practice breast self-awareness.  This means understanding how your breasts normally appear and feel.  It also means doing regular breast self-exams. Let your health care provider know about any changes, no matter how  small.  If you are 25 or older, have a clinician do a breast exam (clinical breast exam or CBE) every year. Depending on your age, family history, and medical history, it may be recommended that you also have a yearly breast X-ray (mammogram).  If you have a family history of breast cancer, talk with your health care provider about genetic screening.  If you are at high risk for breast cancer, talk with your health care provider about having an MRI and a mammogram every year.  Breast cancer (BRCA) gene test is recommended for women who have family members with BRCA-related cancers. Results of the assessment will determine the need for genetic counseling and BRCA1 and for BRCA2 testing. BRCA-related cancers include these types:  Breast. This occurs in males or females.  Ovarian.  Tubal. This may also be called fallopian tube cancer.  Cancer of the abdominal or pelvic lining (peritoneal cancer).  Prostate.  Pancreatic. Cervical, Uterine, and Ovarian Cancer Your health care provider may recommend that you be screened regularly for cancer of the pelvic organs. These include your ovaries, uterus, and vagina. This screening involves a pelvic exam, which includes checking for microscopic changes to the surface of your cervix (Pap test).  For women ages 21-65, health care providers may recommend a pelvic exam and a Pap test every three years. For women ages 16-65, they may recommend the Pap test and pelvic exam, combined with testing for human papilloma virus (HPV), every five years. Some types of HPV increase your risk of cervical cancer. Testing for HPV may also be done on women of any age who have unclear Pap test results.  Other health care providers may not recommend any screening for nonpregnant women who are considered low risk for pelvic cancer and have no symptoms. Ask your health care provider if a screening pelvic exam is right for you.  If you have had past treatment for cervical  cancer or a condition that could lead to cancer, you need Pap tests and screening for cancer for at least 20 years after your treatment. If Pap tests have been discontinued for you, your risk factors (such as having a new sexual partner) need to be reassessed to determine if you should start having screenings again. Some women have medical problems that increase the chance of getting cervical cancer. In these cases, your health care provider may recommend that you have screening and Pap tests more often.  If you have a family history of uterine cancer or ovarian cancer, talk with your health care provider about genetic screening.  If you have vaginal bleeding after reaching menopause, tell your health care provider.  There are currently no reliable tests available to screen for ovarian cancer. Lung Cancer Lung cancer screening is recommended for adults 63-36 years old who are at high risk for lung cancer because of a history of smoking. A yearly low-dose CT scan of the lungs is recommended if you:  Currently smoke.  Have a history of at least 30 pack-years of smoking and you currently smoke or have quit within the past 15 years. A pack-year is smoking an average of one pack of cigarettes per day for one year. Yearly screening should:  Continue until it has been 15 years since you quit.  Stop if you develop a health problem that would prevent you from having lung cancer treatment. Colorectal Cancer  This type of cancer can be detected and can often be prevented.  Routine colorectal cancer screening usually begins at age 62 and continues through age 30.  If you have risk factors for colon cancer, your health care provider may recommend that you be screened at an earlier age.  If you have a family history of colorectal cancer, talk with your health care provider about genetic screening.  Your health care provider may also recommend using home test kits to check for hidden blood in your  stool.  A small camera at the end of a tube can be used to examine your colon directly (sigmoidoscopy or colonoscopy). This is done to check for the earliest forms of colorectal cancer.  Direct examination of the colon should be repeated every 5-10 years until age 51. However, if early forms of precancerous polyps or small growths are found or if you have a family history or genetic risk for colorectal cancer, you may need to be screened more often. Skin Cancer  Check your skin from head to toe regularly.  Monitor any moles. Be sure to tell your health care provider:  About any new moles or changes in moles, especially if there is a change in a mole's shape or color.  If you have a mole that is larger than the size of a pencil eraser.  If any of your family members has a history of skin cancer, especially at a young age, talk with your health care provider about genetic screening.  Always use sunscreen. Apply sunscreen liberally and repeatedly throughout the day.  Whenever you are outside, protect yourself by wearing long sleeves, pants, a wide-brimmed hat, and sunglasses. WHAT SHOULD I KNOW ABOUT OSTEOPOROSIS? Osteoporosis is a condition in which bone destruction happens more quickly than new bone creation. After menopause, you may be at an increased risk for osteoporosis. To help prevent osteoporosis or the bone fractures that can happen because of osteoporosis, the following is recommended:  If you are 81-89 years old, get at least 1,000 mg of calcium and at least 600 mg of vitamin D per day.  If you are older than age 79 but younger than age 72, get at least 1,200 mg of calcium and at least 600 mg of vitamin D per day.  If you are older than age 43, get at least 1,200 mg of calcium and at least 800 mg of vitamin D per day. Smoking and excessive alcohol intake increase the risk of osteoporosis. Eat foods that are rich in calcium and vitamin D, and do weight-bearing exercises several  times each week as directed by your health care provider. WHAT SHOULD I KNOW ABOUT HOW MENOPAUSE AFFECTS [place]? Depression may occur at any age, but it is more common as you become older. Common symptoms of depression include:  Low or sad mood.  Changes in sleep patterns.  Changes in appetite or eating patterns.  Feeling an overall lack of motivation or enjoyment of activities that you previously enjoyed.  Frequent crying spells. Talk with your health care provider if you think that you are experiencing depression. WHAT SHOULD I KNOW ABOUT IMMUNIZATIONS? It is important that you get and maintain your immunizations. These include:  Tetanus, diphtheria, and pertussis (Tdap) booster vaccine.  Influenza every year before the flu season begins.  Pneumonia  vaccine.  Shingles vaccine. Your health care provider may also recommend other immunizations.   This information is not intended to replace advice given to you by your health care provider. Make sure you discuss any questions you have with your health care provider.   Document Released: 06/11/2005 Document Revised: 05/10/2014 Document Reviewed: 12/20/2013 Elsevier Interactive Patient Education [employer].

## 2015-05-19 NOTE — Progress Notes (Signed)
Patient ID: Diane [name], female    DOB: 31-Aug-1955  Age: 60 y.o. MRN: [id number]  The patient is here for annual wellness examination and management of other chronic and acute problems.  Has been having sinus drainage since Saturday.  PND,  Using mucinex .    Last use of antibiotic was 2 weeks ago ,  doxycycline x 7 days, along with prednisone taper.  Last PAP April 2013  (done today)  Last mammogram April 2013 (done today)   The risk factors are reflected in the social history.  The roster of all physicians providing medical care to patient - is listed in the Snapshot section of the chart.  Activities of daily living:  The patient is 100% independent in all ADLs: dressing, toileting, feeding as well as independent mobility  Home safety : The patient has smoke detectors in the home. They wear seatbelts.  There are no firearms at home. There is no violence in the home.   There is no risks for hepatitis, STDs or HIV. There is no   history of blood transfusion. They have no travel history to infectious disease endemic areas of the world.  The patient has seen their dentist in the last six month. They have seen their eye doctor in the last year. They admit to slight hearing difficulty with regard to whispered voices and some television programs.  They have deferred audiologic testing in the last year.  They do not  have excessive sun exposure. Discussed the need for sun protection: hats, long sleeves and use of sunscreen if there is significant sun exposure.   Diet: the importance of a healthy diet is discussed. They not have a healthy diet  Too much candy loves veggies .  The benefits of regular aerobic exercise were discussed. She leads a sedentary lifestyle due to end stage COPD.   Depression screen: there are no signs or vegative symptoms of depression- irritability, change in appetite, anhedonia, sadness/tearfullness.  Cognitive assessment: the patient manages all their financial and  personal affairs and is actively engaged. They could relate day,date,year and events; recalled 2/3 objects at 3 minutes; performed clock-face test normally.  The following portions of the patient's history were reviewed and updated as appropriate: allergies, current medications, past family history, past medical history,  past surgical history, past social history  and problem list.  Visual acuity was not assessed per patient preference since she has regular follow up with her ophthalmologist. Hearing and body mass index were assessed and reviewed.   During the course of the visit the patient was educated and counseled about appropriate screening and preventive services including : fall prevention , diabetes screening, nutrition counseling, colorectal cancer screening, and recommended immunizations.    CC: The primary encounter diagnosis was Adjustment disorder with mixed anxiety and depressed mood. Diagnoses of Breast cancer screening, Cervical cancer screening, Other fatigue, Hyperlipidemia, Vitamin D deficiency, Annual physical exam, Centrilobular emphysema (HCC), and Essential hypertension were also pertinent to this visit.  Has been depressed since her hospitalization in Sept requiring prolonged vent support .  However hr weight loss has resolved. nADIR WAS 115 LBS,  NOW 145  History Haivyn has a past medical history of Hyperlipidemia; Tobacco abuse; Depression; Anxiety; Hypertension; Oxygen deficiency; Heart murmur; Tremors of nervous system; Respiratory disorder; GERD (gastroesophageal reflux disease); Orthopnea; Edema; Wheezing; Pneumonia; Sleep apnea; Headache; Occasional tremors; COPD (chronic obstructive pulmonary disease) (HCC); Bronchitis; Murmur; Tremors of nervous system; Environmental allergies; Lower extremity edema; and Wheezing.   She  has past surgical history that includes Tubal ligation; Fracture surgery (2007); and Cataract extraction w/PHACO (Right, 03/25/2015).   Her family  history includes Hypertension in her father and mother; Stroke in her father.She reports that she quit smoking about 17 months ago. Her smoking use included Cigarettes. She has a 4 pack-year smoking history. She has never used smokeless tobacco. She reports that she does not drink alcohol or use illicit drugs.  Outpatient Prescriptions Prior to Visit  Medication Sig Dispense Refill  . albuterol (PROVENTIL) (2.5 MG/3ML) 0.083% nebulizer solution Take 3 mLs (2.5 mg total) by nebulization every 4 (four) hours as needed. 75 mL 3  . Cholecalciferol (VITAMIN D-3 PO) Take 2,000 Units by mouth daily.    . Fluticasone Furoate-Vilanterol (BREO ELLIPTA) 200-25 MCG/INH AEPB Inhale 1 puff into the lungs daily. 60 each 5  . Loratadine 10 MG CAPS Take 1 capsule by mouth daily. In am.    . Melatonin 5 MG TABS Take 5 mg by mouth at bedtime.    . Multiple Vitamin (MULTIVITAMIN) capsule Take 1 capsule by mouth daily.    Marland Kitchen. senna (SENOKOT) 8.6 MG tablet Take 1 tablet by mouth daily.    . VENTOLIN HFA 108 (90 BASE) MCG/ACT inhaler     . clonazePAM (KLONOPIN) 0.5 MG tablet TAKE ONE TABLET BY MOUTH TWICE DAILY AS NEEDED FOR ANXIETY 60 tablet 0  . Eszopiclone (ESZOPICLONE) 3 MG TABS Take 3 mg by mouth at bedtime. Reported on 05/19/2015    . metoprolol succinate (TOPROL XL) 50 MG 24 hr tablet Take 1 tablet (50 mg total) by mouth 2 (two) times daily. Take with or immediately following a meal. 60 tablet 0  . mirtazapine (REMERON) 7.5 MG tablet Take 1 tablet (7.5 mg total) by mouth at bedtime. 30 tablet 0  . sertraline (ZOLOFT) 100 MG tablet Take 1 tablet (100 mg total) by mouth daily. 30 tablet 0  . Aclidinium Bromide 400 MCG/ACT AEPB Inhale 1 puff into the lungs 2 (two) times daily. (Patient not taking: Reported on 05/19/2015) 1 each 5  . budesonide (PULMICORT) 0.5 MG/2ML nebulizer solution Take 2 mLs (0.5 mg total) by nebulization 2 (two) times daily. (Patient not taking: Reported on 05/19/2015) 120 mL 12  . famotidine  (PEPCID) 20 MG tablet Take 20 mg by mouth 2 (two) times daily. Reported on 05/19/2015    . ipratropium-albuterol (DUONEB) 0.5-2.5 (3) MG/3ML SOLN Take 3 mLs by nebulization every 4 (four) hours as needed. (Patient not taking: Reported on 05/19/2015) 360 mL 4  . predniSONE (DELTASONE) 20 MG tablet 3 tabs x 3 days, 2 tabs x 3 days, 1 tab x 3 days, then stop (Patient not taking: Reported on 05/19/2015) 18 tablet 0  . doxycycline (VIBRA-TABS) 100 MG tablet Take 1 tablet (100 mg total) by mouth 2 (two) times daily. (Patient not taking: Reported on 05/19/2015) 14 tablet 0  . doxycycline (VIBRA-TABS) 100 MG tablet Take 1 tablet (100 mg total) by mouth 2 (two) times daily. (Patient not taking: Reported on 05/19/2015) 14 tablet 0   No facility-administered medications prior to visit.    Review of Systems   Patient denies , fevers, malaise, unintentional weight loss, skin rash, eye pain, sinus congestion and sinus pain, sore throat, dysphagia,  hemoptysis ,chest pain, palpitations, orthopnea, edema, abdominal pain, nausea, melena, diarrhea, constipation, flank pain, dysuria, hematuria, urinary  Frequency, nocturia, numbness, tingling, seizures,  Focal weakness, Loss of consciousness,  Tremor, insomnia, depression, anxiety, and suicidal ideation.  Objective:  BP 158/96 mmHg  Pulse 81  Temp(Src) 98.1 F (36.7 C) (Oral)  Resp 12  Ht 5\' 5"  (1.651 m)  Wt 145 lb 12 oz (66.112 kg)  BMI 24.25 kg/m2  SpO2 99%  Physical Exam   General Appearance:    Alert, cooperative, no distress, appears stated age  Head:    Normocephalic, without obvious abnormality, atraumatic  Eyes:    PERRL, conjunctiva/corneas clear, EOM's intact, fundi    benign, both eyes  Ears:    Normal TM's and external ear canals, both ears  Nose:   Nares normal, septum midline, mucosa normal, no drainage    or sinus tenderness  Throat:   Lips, mucosa, and tongue normal; teeth and gums normal  Neck:   Supple, symmetrical, trachea midline,  no adenopathy;    thyroid:  no enlargement/tenderness/nodules; no carotid   bruit or JVD  Back:     Symmetric, no curvature, ROM normal, no CVA tenderness  Lungs:     Clear to auscultation bilaterally, respirations unlabored  Chest Wall:    No tenderness or deformity   Heart:    Regular rate and rhythm, S1 and S2 normal, no murmur, rub   or gallop  Breast Exam:    No tenderness, masses, or nipple abnormality  Abdomen:     Soft, non-tender, bowel sounds active all four quadrants,    no masses, no organomegaly  Genitalia:    Pelvic: cervix normal in appearance, external genitalia normal, no adnexal masses or tenderness, no cervical motion tenderness, rectovaginal septum normal, uterus normal size, shape, and consistency and vagina normal without discharge  Extremities:   Extremities normal, atraumatic, no cyanosis or edema  Pulses:   2+ and symmetric all extremities  Skin:   Skin color, texture, turgor normal, no rashes or lesions  Lymph nodes:   Cervical, supraclavicular, and axillary nodes normal  Neurologic:   CNII-XII intact, normal strength, sensation and reflexes    throughout       Assessment & Plan:   Problem List Items Addressed This Visit    Hypertension    Not controlled , due to misadministration of metoprolol (using it once daily).  Advised to increase to twice daily and check BP in one week.       Relevant Medications   metoprolol succinate (TOPROL XL) 50 MG 24 hr tablet   Annual physical exam    Annual comprehensive preventive exam was done as well as an evaluation and management of chronic conditions .  During the course of the visit the patient was educated and counseled about appropriate screening and preventive services including :  diabetes screening, lipid analysis with projected  10 year  risk for CAD  using the Framingham risk calculator for women, , nutrition counseling, colorectal cancer screening, and recommended immunizations.  Printed recommendations for  health maintenance screenings was given.       COPD (chronic obstructive pulmonary disease) (HCC)    End stage with prolonged intubation in sept 2016.  Encouraged her to participate in cardiopulmonary rehab.       Relevant Medications   Umeclidinium Bromide (INCRUSE ELLIPTA) 62.5 MCG/INH AEPB   Hyperlipidemia   Relevant Medications   metoprolol succinate (TOPROL XL) 50 MG 24 hr tablet   Other Relevant Orders   Lipid panel (Completed)    Other Visit Diagnoses    Adjustment disorder with mixed anxiety and depressed mood    -  Primary    Relevant Medications    mirtazapine (  REMERON) 15 MG tablet    sertraline (ZOLOFT) 100 MG tablet    Breast cancer screening        Relevant Orders    MM DIGITAL SCREENING BILATERAL    Cervical cancer screening        Relevant Orders    Cytology - PAP    Other fatigue        Relevant Orders    Comprehensive metabolic panel (Completed)    TSH (Completed)    Vitamin D deficiency        Relevant Orders    VITAMIN D 25 Hydroxy (Vit-D Deficiency, Fractures) (Completed)       I have discontinued Ms. Filippini's doxycycline and doxycycline. I have also changed her mirtazapine, clonazePAM, and Eszopiclone. Additionally, I am having her maintain her Melatonin, famotidine, Loratadine, multivitamin, senna, Cholecalciferol (VITAMIN D-3 PO), VENTOLIN HFA, ipratropium-albuterol, Fluticasone Furoate-Vilanterol, budesonide, albuterol, predniSONE, Aclidinium Bromide, Umeclidinium Bromide, metoprolol succinate, and sertraline.  Meds ordered this encounter  Medications  . Umeclidinium Bromide (INCRUSE ELLIPTA) 62.5 MCG/INH AEPB    Sig: Inhale 1 puff into the lungs daily.  . mirtazapine (REMERON) 15 MG tablet    Sig: Take 1 tablet (15 mg total) by mouth at bedtime.    Dispense:  30 tablet    Refill:  3  . metoprolol succinate (TOPROL XL) 50 MG 24 hr tablet    Sig: Take 1 tablet (50 mg total) by mouth 2 (two) times daily. Take with or immediately following a meal.     Dispense:  60 tablet    Refill:  5  . sertraline (ZOLOFT) 100 MG tablet    Sig: Take 1 tablet (100 mg total) by mouth daily.    Dispense:  30 tablet    Refill:  5  . clonazePAM (KLONOPIN) 0.5 MG tablet    Sig: Take 1 tablet (0.5 mg total) by mouth 2 (two) times daily as needed. for anxiety    Dispense:  60 tablet    Refill:  5  . Eszopiclone (ESZOPICLONE) 3 MG TABS    Sig: Take 1 tablet (3 mg total) by mouth at bedtime. Reported on 05/19/2015    Dispense:  30 tablet    Refill:  5    Medications Discontinued During This Encounter  Medication Reason  . doxycycline (VIBRA-TABS) 100 MG tablet Completed Course  . doxycycline (VIBRA-TABS) 100 MG tablet Completed Course  . mirtazapine (REMERON) 7.5 MG tablet Reorder  . metoprolol succinate (TOPROL XL) 50 MG 24 hr tablet Reorder  . sertraline (ZOLOFT) 100 MG tablet Reorder  . clonazePAM (KLONOPIN) 0.5 MG tablet Reorder  . Eszopiclone (ESZOPICLONE) 3 MG TABS Reorder    Follow-up: No Follow-up on file.   Sherlene Shams, MD

## 2015-05-19 NOTE — Progress Notes (Signed)
Pre-visit discussion using our clinic review tool. No additional management support is needed unless otherwise documented below in the visit note.  

## 2015-05-20 ENCOUNTER — Encounter: Payer: Self-pay | Admitting: Internal Medicine

## 2015-05-20 ENCOUNTER — Telehealth: Payer: Self-pay | Admitting: *Deleted

## 2015-05-20 LAB — LDL CHOLESTEROL, DIRECT: Direct LDL: 230 mg/dL

## 2015-05-20 NOTE — Telephone Encounter (Signed)
Initiated PA for [employer] thru [facility] Key: [facility] Pt ZO:[id number]

## 2015-05-20 NOTE — Assessment & Plan Note (Signed)
Not controlled , due to misadministration of metoprolol (using it once daily).  Advised to increase to twice daily and check BP in one week.

## 2015-05-20 NOTE — Assessment & Plan Note (Signed)
End stage with prolonged intubation in sept 2016.  Encouraged her to participate in cardiopulmonary rehab.

## 2015-05-20 NOTE — Assessment & Plan Note (Signed)

## 2015-05-21 ENCOUNTER — Encounter: Payer: Self-pay | Admitting: Internal Medicine

## 2015-05-21 NOTE — Telephone Encounter (Signed)
Received an approval for Tudorza until 05/20/2016. Pharmacy informed. Pt informed. Also coupon left up front for pt to pick. Nothing further needed.

## 2015-05-22 ENCOUNTER — Encounter: Payer: Self-pay | Admitting: Internal Medicine

## 2015-05-22 LAB — CYTOLOGY - PAP

## 2015-05-23 ENCOUNTER — Other Ambulatory Visit: Payer: Self-pay | Admitting: Internal Medicine

## 2015-05-23 MED ORDER — PREDNISONE 20 MG PO TABS: ORAL_TABLET | ORAL | Status: DC

## 2015-05-23 MED ORDER — ATORVASTATIN CALCIUM 20 MG PO TABS: 20.0000 mg | ORAL_TABLET | Freq: Every day | ORAL | Status: DC

## 2015-05-23 MED ORDER — PREDNISONE 10 MG PO TABS: ORAL_TABLET | ORAL | Status: DC

## 2015-05-23 MED ORDER — DOXYCYCLINE HYCLATE 100 MG PO CAPS: 100.0000 mg | ORAL_CAPSULE | Freq: Two times a day (BID) | ORAL | Status: DC

## 2015-05-24 ENCOUNTER — Encounter: Payer: Self-pay | Admitting: Internal Medicine

## 2015-06-05 ENCOUNTER — Other Ambulatory Visit: Payer: Self-pay

## 2015-06-05 MED ORDER — ALBUTEROL SULFATE (2.5 MG/3ML) 0.083% IN NEBU: 2.5000 mg | INHALATION_SOLUTION | RESPIRATORY_TRACT | Status: DC | PRN

## 2015-06-13 ENCOUNTER — Encounter: Payer: Self-pay | Admitting: Internal Medicine

## 2015-06-15 ENCOUNTER — Emergency Department
Admission: EM | Admit: 2015-06-15 | Discharge: 2015-06-15 | Disposition: A | Discharge status: Home / Self Care | Payer: Managed Care, Other (non HMO) | Attending: Emergency Medicine | Admitting: Emergency Medicine

## 2015-06-15 ENCOUNTER — Emergency Department: Payer: Managed Care, Other (non HMO)

## 2015-06-15 DIAGNOSIS — I10 Essential (primary) hypertension: Secondary | ICD-10-CM | POA: Insufficient documentation

## 2015-06-15 DIAGNOSIS — Z79899 Other long term (current) drug therapy: Secondary | ICD-10-CM | POA: Diagnosis not present

## 2015-06-15 DIAGNOSIS — Z87891 Personal history of nicotine dependence: Secondary | ICD-10-CM | POA: Insufficient documentation

## 2015-06-15 DIAGNOSIS — J440 Chronic obstructive pulmonary disease with acute lower respiratory infection: Secondary | ICD-10-CM | POA: Diagnosis not present

## 2015-06-15 DIAGNOSIS — Z792 Long term (current) use of antibiotics: Secondary | ICD-10-CM | POA: Diagnosis not present

## 2015-06-15 DIAGNOSIS — Z9981 Dependence on supplemental oxygen: Secondary | ICD-10-CM | POA: Insufficient documentation

## 2015-06-15 DIAGNOSIS — J209 Acute bronchitis, unspecified: Secondary | ICD-10-CM

## 2015-06-15 DIAGNOSIS — Z7951 Long term (current) use of inhaled steroids: Secondary | ICD-10-CM | POA: Insufficient documentation

## 2015-06-15 DIAGNOSIS — Z7952 Long term (current) use of systemic steroids: Secondary | ICD-10-CM | POA: Diagnosis not present

## 2015-06-15 DIAGNOSIS — Z880 Allergy status to penicillin: Secondary | ICD-10-CM | POA: Insufficient documentation

## 2015-06-15 DIAGNOSIS — R0602 Shortness of breath: Secondary | ICD-10-CM | POA: Diagnosis present

## 2015-06-15 MED ORDER — AZITHROMYCIN 250 MG PO TABS
500.0000 mg | ORAL_TABLET | Freq: Once | ORAL | Status: AC
  Administered 2015-06-15: 500 mg via ORAL

## 2015-06-15 MED ORDER — PREDNISONE 20 MG PO TABS: 40.0000 mg | ORAL_TABLET | Freq: Every day | ORAL | Status: DC

## 2015-06-15 MED ORDER — GUAIFENESIN ER 600 MG PO TB12: 600.0000 mg | ORAL_TABLET | Freq: Two times a day (BID) | ORAL | Status: DC

## 2015-06-15 MED ORDER — AZITHROMYCIN 250 MG PO TABS
ORAL_TABLET | ORAL | Status: AC
  Administered 2015-06-15: 500 mg via ORAL
  Filled 2015-06-15: qty 2

## 2015-06-15 MED ORDER — IPRATROPIUM-ALBUTEROL 0.5-2.5 (3) MG/3ML IN SOLN
RESPIRATORY_TRACT | Status: AC
  Administered 2015-06-15: 3 mL via RESPIRATORY_TRACT
  Filled 2015-06-15: qty 3

## 2015-06-15 MED ORDER — IPRATROPIUM-ALBUTEROL 0.5-2.5 (3) MG/3ML IN SOLN
3.0000 mL | Freq: Once | RESPIRATORY_TRACT | Status: AC
  Administered 2015-06-15: 3 mL via RESPIRATORY_TRACT

## 2015-06-15 MED ORDER — AZITHROMYCIN 250 MG PO TABS
ORAL_TABLET | ORAL | Status: AC
Start: 2015-06-15 — End: 2015-06-20

## 2015-06-15 NOTE — ED Notes (Signed)
Pt alert and oriented X4, active, cooperative, pt in NAD. RR even and unlabored, color WNL.  Pt informed to return if any life threatening symptoms occur.   

## 2015-06-15 NOTE — ED Notes (Signed)
Green productive sputum that began today. Pt c/o SOB. PT wears 2L of [place] at home.
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
## 2015-06-15 NOTE — Discharge Instructions (Signed)

## 2015-06-15 NOTE — ED Notes (Signed)
MD at bedside. 

## 2015-06-15 NOTE — ED Notes (Signed)
C/o cough and congestion worsening for the past weeks, states thick green mucous when does get it to come up.  Normally on 2L of O2, did albuterol treatment at home, usually takes q4h as needed.

## 2015-06-22 ENCOUNTER — Encounter: Payer: Self-pay | Admitting: Emergency Medicine

## 2015-06-22 ENCOUNTER — Emergency Department
Admission: EM | Admit: 2015-06-22 | Discharge: 2015-06-22 | Disposition: A | Discharge status: Home / Self Care | Payer: Managed Care, Other (non HMO) | Attending: Emergency Medicine | Admitting: Emergency Medicine

## 2015-06-22 ENCOUNTER — Emergency Department: Payer: Managed Care, Other (non HMO)

## 2015-06-22 DIAGNOSIS — Z880 Allergy status to penicillin: Secondary | ICD-10-CM | POA: Diagnosis not present

## 2015-06-22 DIAGNOSIS — Z7951 Long term (current) use of inhaled steroids: Secondary | ICD-10-CM | POA: Insufficient documentation

## 2015-06-22 DIAGNOSIS — Z9981 Dependence on supplemental oxygen: Secondary | ICD-10-CM | POA: Diagnosis not present

## 2015-06-22 DIAGNOSIS — J441 Chronic obstructive pulmonary disease with (acute) exacerbation: Secondary | ICD-10-CM | POA: Insufficient documentation

## 2015-06-22 DIAGNOSIS — I10 Essential (primary) hypertension: Secondary | ICD-10-CM | POA: Insufficient documentation

## 2015-06-22 DIAGNOSIS — Z79899 Other long term (current) drug therapy: Secondary | ICD-10-CM | POA: Insufficient documentation

## 2015-06-22 DIAGNOSIS — Z87891 Personal history of nicotine dependence: Secondary | ICD-10-CM | POA: Insufficient documentation

## 2015-06-22 DIAGNOSIS — R0602 Shortness of breath: Secondary | ICD-10-CM | POA: Diagnosis present

## 2015-06-22 LAB — GLUCOSE, CAPILLARY: Glucose-Capillary: 102 mg/dL — ABNORMAL HIGH (ref 65–99)

## 2015-06-22 MED ORDER — PREDNISONE 20 MG PO TABS
40.0000 mg | ORAL_TABLET | Freq: Every day | ORAL | Status: AC
Start: 2015-06-23 — End: 2015-06-27

## 2015-06-22 MED ORDER — IPRATROPIUM-ALBUTEROL 0.5-2.5 (3) MG/3ML IN SOLN
3.0000 mL | Freq: Once | RESPIRATORY_TRACT | Status: AC
  Administered 2015-06-22: 3 mL via RESPIRATORY_TRACT
  Filled 2015-06-22: qty 3

## 2015-06-22 MED ORDER — ALBUTEROL SULFATE (2.5 MG/3ML) 0.083% IN NEBU
5.0000 mg | INHALATION_SOLUTION | Freq: Once | RESPIRATORY_TRACT | Status: AC
  Administered 2015-06-22: 5 mg via RESPIRATORY_TRACT
  Filled 2015-06-22: qty 6

## 2015-06-22 MED ORDER — PREDNISONE 20 MG PO TABS
60.0000 mg | ORAL_TABLET | Freq: Once | ORAL | Status: AC
  Administered 2015-06-22: 60 mg via ORAL
  Filled 2015-06-22: qty 3
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
## 2015-06-22 NOTE — ED Notes (Signed)
Pt states was seen here last Sunday and dx with bronchitis. Pt states hx of COPD and on home O2 at 2L. Pt states her breathing is not any better and c/o SOB and cough at this time. Pt states she has been taking neb treatments at home with no relief.

## 2015-06-22 NOTE — Discharge Instructions (Signed)
Chronic Obstructive Pulmonary Disease °Chronic obstructive pulmonary disease (COPD) is a common lung condition in which airflow from the lungs is limited. COPD is a general term that can be used to describe many different lung problems that limit airflow, including both chronic bronchitis and emphysema. If you have COPD, your lung function will probably never return to normal, but there are measures you can take to improve lung function and make yourself feel better. °CAUSES  °· Smoking (common). °· Exposure to secondhand smoke. °· Genetic problems. °· Chronic inflammatory lung diseases or recurrent infections. °SYMPTOMS °· Shortness of breath, especially with physical activity. °· Deep, persistent (chronic) cough with a large amount of thick mucus. °· Wheezing. °· Rapid breaths (tachypnea). °· Gray or bluish discoloration (cyanosis) of the skin, especially in your fingers, toes, or lips. °· Fatigue. °· Weight loss. °· Frequent infections or episodes when breathing symptoms become much worse (exacerbations). °· Chest tightness. °DIAGNOSIS °Your health care provider will take a medical history and perform a physical examination to diagnose COPD. Additional tests for COPD may include: °· Lung (pulmonary) function tests. °· Chest X-ray. °· CT scan. °· Blood tests. °TREATMENT  °Treatment for COPD may include: °· Inhaler and nebulizer medicines. These help manage the symptoms of COPD and make your breathing more comfortable. °· Supplemental oxygen. Supplemental oxygen is only helpful if you have a low oxygen level in your blood. °· Exercise and physical activity. These are beneficial for nearly all people with COPD. °· Lung surgery or transplant. °· Nutrition therapy to gain weight, if you are underweight. °· Pulmonary rehabilitation. This may involve working with a team of health care providers and specialists, such as respiratory, occupational, and physical therapists. °HOME CARE INSTRUCTIONS °· Take all medicines  (inhaled or pills) as directed by your health care provider. °· Avoid over-the-counter medicines or cough syrups that dry up your airway (such as antihistamines) and slow down the elimination of secretions unless instructed otherwise by your health care provider. °· If you are a smoker, the most important thing that you can do is stop smoking. Continuing to smoke will cause further lung damage and breathing trouble. Ask your health care provider for help with quitting smoking. He or she can direct you to community resources or hospitals that provide support. °· Avoid exposure to irritants such as smoke, chemicals, and fumes that aggravate your breathing. °· Use oxygen therapy and pulmonary rehabilitation if directed by your health care provider. If you require home oxygen therapy, ask your health care provider whether you should purchase a pulse oximeter to measure your oxygen level at home. °· Avoid contact with individuals who have a contagious illness. °· Avoid extreme temperature and humidity changes. °· Eat healthy foods. Eating smaller, more frequent meals and resting before meals may help you maintain your strength. °· Stay active, but balance activity with periods of rest. Exercise and physical activity will help you maintain your ability to do things you want to do. °· Preventing infection and hospitalization is very important when you have COPD. Make sure to receive all the vaccines your health care provider recommends, especially the pneumococcal and influenza vaccines. Ask your health care provider whether you need a pneumonia vaccine. °· Learn and use relaxation techniques to manage stress. °· Learn and use controlled breathing techniques as directed by your health care provider. Controlled breathing techniques include: °¨ Pursed lip breathing. Start by breathing in (inhaling) through your nose for 1 second. Then, purse your lips as if you were   going to whistle and breathe out (exhale) through the  pursed lips for 2 seconds. °¨ Diaphragmatic breathing. Start by putting one hand on your abdomen just above your waist. Inhale slowly through your nose. The hand on your abdomen should move out. Then purse your lips and exhale slowly. You should be able to feel the hand on your abdomen moving in as you exhale. °· Learn and use controlled coughing to clear mucus from your lungs. Controlled coughing is a series of short, progressive coughs. The steps of controlled coughing are: °1. Lean your head slightly forward. °2. Breathe in deeply using diaphragmatic breathing. °3. Try to hold your breath for 3 seconds. °4. Keep your mouth slightly open while coughing twice. °5. Spit any mucus out into a tissue. °6. Rest and repeat the steps once or twice as needed. °SEEK MEDICAL CARE IF: °· You are coughing up more mucus than usual. °· There is a change in the color or thickness of your mucus. °· Your breathing is more labored than usual. °· Your breathing is faster than usual. °SEEK IMMEDIATE MEDICAL CARE IF: °· You have shortness of breath while you are resting. °· You have shortness of breath that prevents you from: °¨ Being able to talk. °¨ Performing your usual physical activities. °· You have chest pain lasting longer than 5 minutes. °· Your skin color is more cyanotic than usual. °· You measure low oxygen saturations for longer than 5 minutes with a pulse oximeter. °MAKE SURE YOU: °· Understand these instructions. °· Will watch your condition. °· Will get help right away if you are not doing well or get worse. °  °This information is not intended to replace advice given to you by your health care provider. Make sure you discuss any questions you have with your health care provider. °  °Document Released: 01/27/2005 Document Revised: 05/10/2014 Document Reviewed: 12/14/2012 °Elsevier Interactive Patient Education ©2016 [employer] ° ° °Please return immediately if condition worsens. Please contact her primary physician  or the physician you were given for referral. If you have any specialist physicians involved in her treatment and plan please also contact them. Thank you for using [facility]. ° °

## 2015-06-26 ENCOUNTER — Telehealth: Payer: Self-pay | Admitting: Internal Medicine

## 2015-07-04 ENCOUNTER — Ambulatory Visit (INDEPENDENT_AMBULATORY_CARE_PROVIDER_SITE_OTHER): Payer: Managed Care, Other (non HMO) | Admitting: Pulmonary Disease

## 2015-07-04 ENCOUNTER — Encounter: Payer: Self-pay | Admitting: Pulmonary Disease

## 2015-07-04 VITALS — BP 150/98 | HR 99 | Ht 66.0 in | Wt 157.8 lb

## 2015-07-04 DIAGNOSIS — J438 Other emphysema: Secondary | ICD-10-CM

## 2015-07-04 DIAGNOSIS — J42 Unspecified chronic bronchitis: Secondary | ICD-10-CM | POA: Diagnosis not present

## 2015-07-04 DIAGNOSIS — R0902 Hypoxemia: Secondary | ICD-10-CM

## 2015-07-04 MED ORDER — UMECLIDINIUM BROMIDE 62.5 MCG/INH IN AEPB: 1.0000 | INHALATION_SPRAY | Freq: Every day | RESPIRATORY_TRACT | Status: AC

## 2015-07-04 MED ORDER — TIOTROPIUM BROMIDE MONOHYDRATE 2.5 MCG/ACT IN AERS: 1.0000 | INHALATION_SPRAY | Freq: Every day | RESPIRATORY_TRACT | Status: DC

## 2015-07-08 ENCOUNTER — Encounter: Payer: Self-pay | Admitting: Pulmonary Disease

## 2015-07-08 NOTE — Progress Notes (Signed)
PROBLEMS: Severe COPD  HISTORY: 08/24 - 01/02/15 [facility], [facility]ub[facility]02[facility] extubated 01/14/15  [facility]  01/28/15 Office visit - severe nausea. Daliresp and guaifenesin DC'd 02/21/15 Office visit - much improved 03/04/15 Acute office eval for AECOPD. Breo, Incruse, prednisone, azithromycin 03/28/15 ED encounter - increased dyspnea, purulent sputum. Treated with nebulized BDs, rx pred 50 mg X 4 days which she has completed. She was not prescribed antibiotics 04/02/15: purulent sputum, no wheezing. Treated with Doxycycline X 7 days 04/14/15 Office visit - improving, still coughing. Increased dyspnea with wheezing noted on exam. Prednisone taper ordered. 04/24/15 Office visit - improving 06/15/15 ED visit for acute bronchitis - azithromycin 07/04/15 Office visit - 50% improved. Still with acute on chronic dyspnea. Not on prednisone.   SUBJ: Still with disabling DOE, NP cough. Denies CP, fever, purulent sputum, hemoptysis, LE edema and calf tenderness  OBJ: Filed Vitals:   07/04/15 1110  BP: 150/98  Pulse: 99  Height: 5\' 6"  (1.676 m)  Weight: 157 lb 12.8 oz (71.578 kg)  SpO2: 91%   Tremulous, no respiratory distress HEENT WNL Hyperresonant to percussion, markedly diminished BS, no wheezes RRR s M NABS, soft No C/C/E  DATA:  CBC Latest Ref Rng 03/28/2015 01/30/2015 01/28/2015  WBC 3.6 - 11.0 K/uL 11.5(H) 6.7 6.9  Hemoglobin 12.0 - 16.0 g/dL 16.113.6 10.5(L) 11.8(L)  Hematocrit 35.0 - 47.0 % 41.0 30.9(L) 35.2  Platelets 150 - 440 K/uL 349 263 254   CXR 06/22/15: hyperinflated, hyperlucent, NAD   IMPRESSION: Severe COPD - emphysema and chronic bronchitis Acute COPD exacerbation slowly resolving Chronic hypoxemia   PLAN: Cont COPD regimen as documented ROV 6 weeks  Merwyn Katosavid B Simonds, MD [facility]

## 2015-07-13 ENCOUNTER — Emergency Department: Payer: Managed Care, Other (non HMO)

## 2015-07-13 ENCOUNTER — Emergency Department
Admission: EM | Admit: 2015-07-13 | Discharge: 2015-07-13 | Disposition: A | Discharge status: Home / Self Care | Payer: Managed Care, Other (non HMO) | Attending: Emergency Medicine | Admitting: Emergency Medicine

## 2015-07-13 DIAGNOSIS — Z880 Allergy status to penicillin: Secondary | ICD-10-CM | POA: Diagnosis not present

## 2015-07-13 DIAGNOSIS — J441 Chronic obstructive pulmonary disease with (acute) exacerbation: Secondary | ICD-10-CM | POA: Diagnosis not present

## 2015-07-13 DIAGNOSIS — Z7952 Long term (current) use of systemic steroids: Secondary | ICD-10-CM | POA: Diagnosis not present

## 2015-07-13 DIAGNOSIS — Z79899 Other long term (current) drug therapy: Secondary | ICD-10-CM | POA: Diagnosis not present

## 2015-07-13 DIAGNOSIS — I10 Essential (primary) hypertension: Secondary | ICD-10-CM | POA: Insufficient documentation

## 2015-07-13 DIAGNOSIS — Z7951 Long term (current) use of inhaled steroids: Secondary | ICD-10-CM | POA: Diagnosis not present

## 2015-07-13 DIAGNOSIS — R05 Cough: Secondary | ICD-10-CM | POA: Diagnosis present

## 2015-07-13 DIAGNOSIS — Z87891 Personal history of nicotine dependence: Secondary | ICD-10-CM | POA: Diagnosis not present

## 2015-07-13 DIAGNOSIS — Z792 Long term (current) use of antibiotics: Secondary | ICD-10-CM | POA: Diagnosis not present

## 2015-07-13 LAB — COMPREHENSIVE METABOLIC PANEL
ALT: 29 U/L (ref 14–54)
ANION GAP: 9 (ref 5–15)
AST: 27 U/L (ref 15–41)
Albumin: 4 g/dL (ref 3.5–5.0)
Alkaline Phosphatase: 51 U/L (ref 38–126)
BILIRUBIN TOTAL: 0.8 mg/dL (ref 0.3–1.2)
BUN: 8 mg/dL (ref 6–20)
CO2: 27 mmol/L (ref 22–32)
Calcium: 9.4 mg/dL (ref 8.9–10.3)
Chloride: 103 mmol/L (ref 101–111)
Creatinine, Ser: 0.69 mg/dL (ref 0.44–1.00)
GFR calc Af Amer: >60 mL/min (ref >60)
GLUCOSE: 104 mg/dL — AB (ref 65–99)
POTASSIUM: 3.8 mmol/L (ref 3.5–5.1)
Sodium: 139 mmol/L (ref 135–145)
TOTAL PROTEIN: 7 g/dL (ref 6.5–8.1)

## 2015-07-13 LAB — CBC WITH DIFFERENTIAL/PLATELET
Basophils Absolute: 0 10*3/uL (ref 0–0.1)
Basophils Relative: 0 %
EOS ABS: 0.4 10*3/uL (ref 0–0.7)
Eosinophils Relative: 7 %
HCT: 37 % (ref 35.0–47.0)
HEMOGLOBIN: 12.7 g/dL (ref 12.0–16.0)
LYMPHS ABS: 1.7 10*3/uL (ref 1.0–3.6)
LYMPHS PCT: 28 %
MCH: 30 pg (ref 26.0–34.0)
MCHC: 34.5 g/dL (ref 32.0–36.0)
MCV: 87.1 fL (ref 80.0–100.0)
MONOS PCT: 7 %
Monocytes Absolute: 0.4 10*3/uL (ref 0.2–0.9)
NEUTROS PCT: 58 %
Neutro Abs: 3.5 10*3/uL (ref 1.4–6.5)
Platelets: 266 10*3/uL (ref 150–440)
RBC: 4.24 MIL/uL (ref 3.80–5.20)
RDW: 14.7 % — ABNORMAL HIGH (ref 11.5–14.5)
WBC: 6.1 10*3/uL (ref 3.6–11.0)

## 2015-07-13 MED ORDER — PREDNISONE 20 MG PO TABS: 40.0000 mg | ORAL_TABLET | Freq: Every day | ORAL | Status: DC

## 2015-07-13 MED ORDER — IPRATROPIUM-ALBUTEROL 0.5-2.5 (3) MG/3ML IN SOLN
9.0000 mL | Freq: Once | RESPIRATORY_TRACT | Status: AC
  Administered 2015-07-13: 9 mL via RESPIRATORY_TRACT
  Filled 2015-07-13: qty 9

## 2015-07-13 MED ORDER — PREDNISONE 20 MG PO TABS
60.0000 mg | ORAL_TABLET | ORAL | Status: AC
  Administered 2015-07-13: 60 mg via ORAL
  Filled 2015-07-13: qty 3

## 2015-07-13 MED ORDER — DOXYCYCLINE HYCLATE 100 MG PO CAPS: 100.0000 mg | ORAL_CAPSULE | Freq: Two times a day (BID) | ORAL | Status: DC

## 2015-07-13 NOTE — Discharge Instructions (Signed)

## 2015-07-13 NOTE — ED Notes (Signed)
Patient transported to X-ray 

## 2015-07-13 NOTE — ED Notes (Signed)
MD Stafford at bedside. 

## 2015-07-13 NOTE — ED Notes (Signed)
Pt reports cough and URI symptoms x 5 days. Pt on home o2 and reports her breathing has gotten worse.
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
## 2015-07-13 NOTE — ED Notes (Signed)
Cough, congestion, stuffy nose X 4 days. Pt alert and oriented X4, active, cooperative, pt in NAD. RR even, color WNL.

## 2015-07-15 ENCOUNTER — Encounter: Payer: Self-pay | Admitting: Internal Medicine

## 2015-07-15 MED ORDER — IPRATROPIUM-ALBUTEROL 0.5-2.5 (3) MG/3ML IN SOLN: 3.0000 mL | RESPIRATORY_TRACT | Status: DC | PRN

## 2015-08-05 ENCOUNTER — Telehealth: Payer: Self-pay | Admitting: Pulmonary Disease

## 2015-08-05 MED ORDER — ALBUTEROL SULFATE (2.5 MG/3ML) 0.083% IN NEBU: 2.5000 mg | INHALATION_SOLUTION | RESPIRATORY_TRACT | Status: DC | PRN

## 2015-08-05 MED ORDER — PREDNISONE 10 MG PO TABS
ORAL_TABLET | ORAL | Status: DC
Start: 2015-08-05 — End: 2015-08-29

## 2015-08-05 NOTE — Telephone Encounter (Signed)
Pt states she is still having some trouble breathing. [facility] for COPD exacerbation and was given Prednisone 40mg  x 4days and Doxycycline for 2 weeks. Pt is using Albuterol neb Q4hrs ATC. Please advise.

## 2015-08-05 NOTE — Addendum Note (Signed)
Addended by: Meyer CoryAHMAD, MISTY R on: 08/05/2015 12:07 PM   Modules accepted: Orders

## 2015-08-05 NOTE — Telephone Encounter (Signed)
Please call patient as she is still having breathing problems. Medication isn't working. Feeling bad again (0-10.Marland Kitchen. She is at a 5).

## 2015-08-05 NOTE — Telephone Encounter (Signed)
Prednisone sent ot pharmacty

## 2015-08-05 NOTE — Telephone Encounter (Signed)
Pt informed. Nothing further needed. 

## 2015-08-05 NOTE — Telephone Encounter (Signed)
Prednisone 10 mg # 60  6 daily X 3 days  Then 4 daily X 3 days  Then 2 daily for 3 days  Then one daily until follow up with me  Thanks,  Theodoro Gristave

## 2015-08-08 ENCOUNTER — Ambulatory Visit: Payer: Managed Care, Other (non HMO) | Admitting: Pulmonary Disease

## 2015-08-18 ENCOUNTER — Encounter: Payer: Self-pay | Admitting: Pulmonary Disease

## 2015-08-18 ENCOUNTER — Ambulatory Visit (INDEPENDENT_AMBULATORY_CARE_PROVIDER_SITE_OTHER): Payer: Managed Care, Other (non HMO) | Admitting: Pulmonary Disease

## 2015-08-18 VITALS — BP 142/82 | HR 87 | Ht 66.0 in | Wt 160.2 lb

## 2015-08-18 DIAGNOSIS — J440 Chronic obstructive pulmonary disease with acute lower respiratory infection: Secondary | ICD-10-CM

## 2015-08-18 DIAGNOSIS — J439 Emphysema, unspecified: Secondary | ICD-10-CM | POA: Diagnosis not present

## 2015-08-18 DIAGNOSIS — J45909 Unspecified asthma, uncomplicated: Secondary | ICD-10-CM | POA: Diagnosis not present
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
## 2015-08-18 NOTE — Patient Instructions (Signed)
Your COPD medical regimen should be: Spiriva respimat  Breo PRN albuterol, duoneb  Decrease prednisone to 1/2 tab per day (5 mg)  Discuss your blood pressure medications with Dr Darrick Huntsmanullo  Discuss the possible role of Remeron and/or Zoloft in your weight gain with Dr Darrick Huntsmanullo

## 2015-08-20 ENCOUNTER — Other Ambulatory Visit: Payer: Self-pay | Admitting: Dentistry

## 2015-08-20 DIAGNOSIS — J449 Chronic obstructive pulmonary disease, unspecified: Secondary | ICD-10-CM

## 2015-08-20 DIAGNOSIS — F32A Depression, unspecified: Secondary | ICD-10-CM

## 2015-08-20 DIAGNOSIS — F419 Anxiety disorder, unspecified: Secondary | ICD-10-CM

## 2015-08-20 DIAGNOSIS — F329 Major depressive disorder, single episode, unspecified: Secondary | ICD-10-CM

## 2015-08-27 ENCOUNTER — Other Ambulatory Visit: Payer: Self-pay | Admitting: Pulmonary Disease

## 2015-08-27 NOTE — Telephone Encounter (Signed)
Pt last seen 4.17.17 by DS.  Refill request for pred taper.  Note to pharmacy that if pt is sick, she needs to contact the office to speak with nurse.

## 2015-08-29 ENCOUNTER — Telehealth: Payer: Self-pay | Admitting: Pulmonary Disease

## 2015-08-29 MED ORDER — PREDNISONE 10 MG PO TABS: ORAL_TABLET | ORAL | Status: DC

## 2015-08-29 NOTE — Telephone Encounter (Signed)
*  STAT* If patient is at the pharmacy, call can be transferred to refill team.   1. Which medications need to be refilled? (please list name of each medication and dose if known)   Prednisone 5mg   2. Which pharmacy/location (including street and city if local pharmacy) is medication to be sent to? [facility] [address]  3. Do they need a 30 day or 90 day supply? 30 days

## 2015-08-29 NOTE — Telephone Encounter (Signed)
Prednisone refilled. Nothing further needed.

## 2015-09-05 ENCOUNTER — Telehealth: Payer: Self-pay | Admitting: Cardiovascular Disease

## 2015-09-05 NOTE — Telephone Encounter (Signed)
Received records request Disabiltiy Determination Services, forwarded to [employer] for processing.  

## 2015-09-08 IMAGING — CR DG CHEST 1V
1 series · 1 of 1 positions shown · non-contrast
Comparison: 12/28/2014

CLINICAL DATA: Intubated.

EXAM:
CHEST  1 VIEW

[ap]
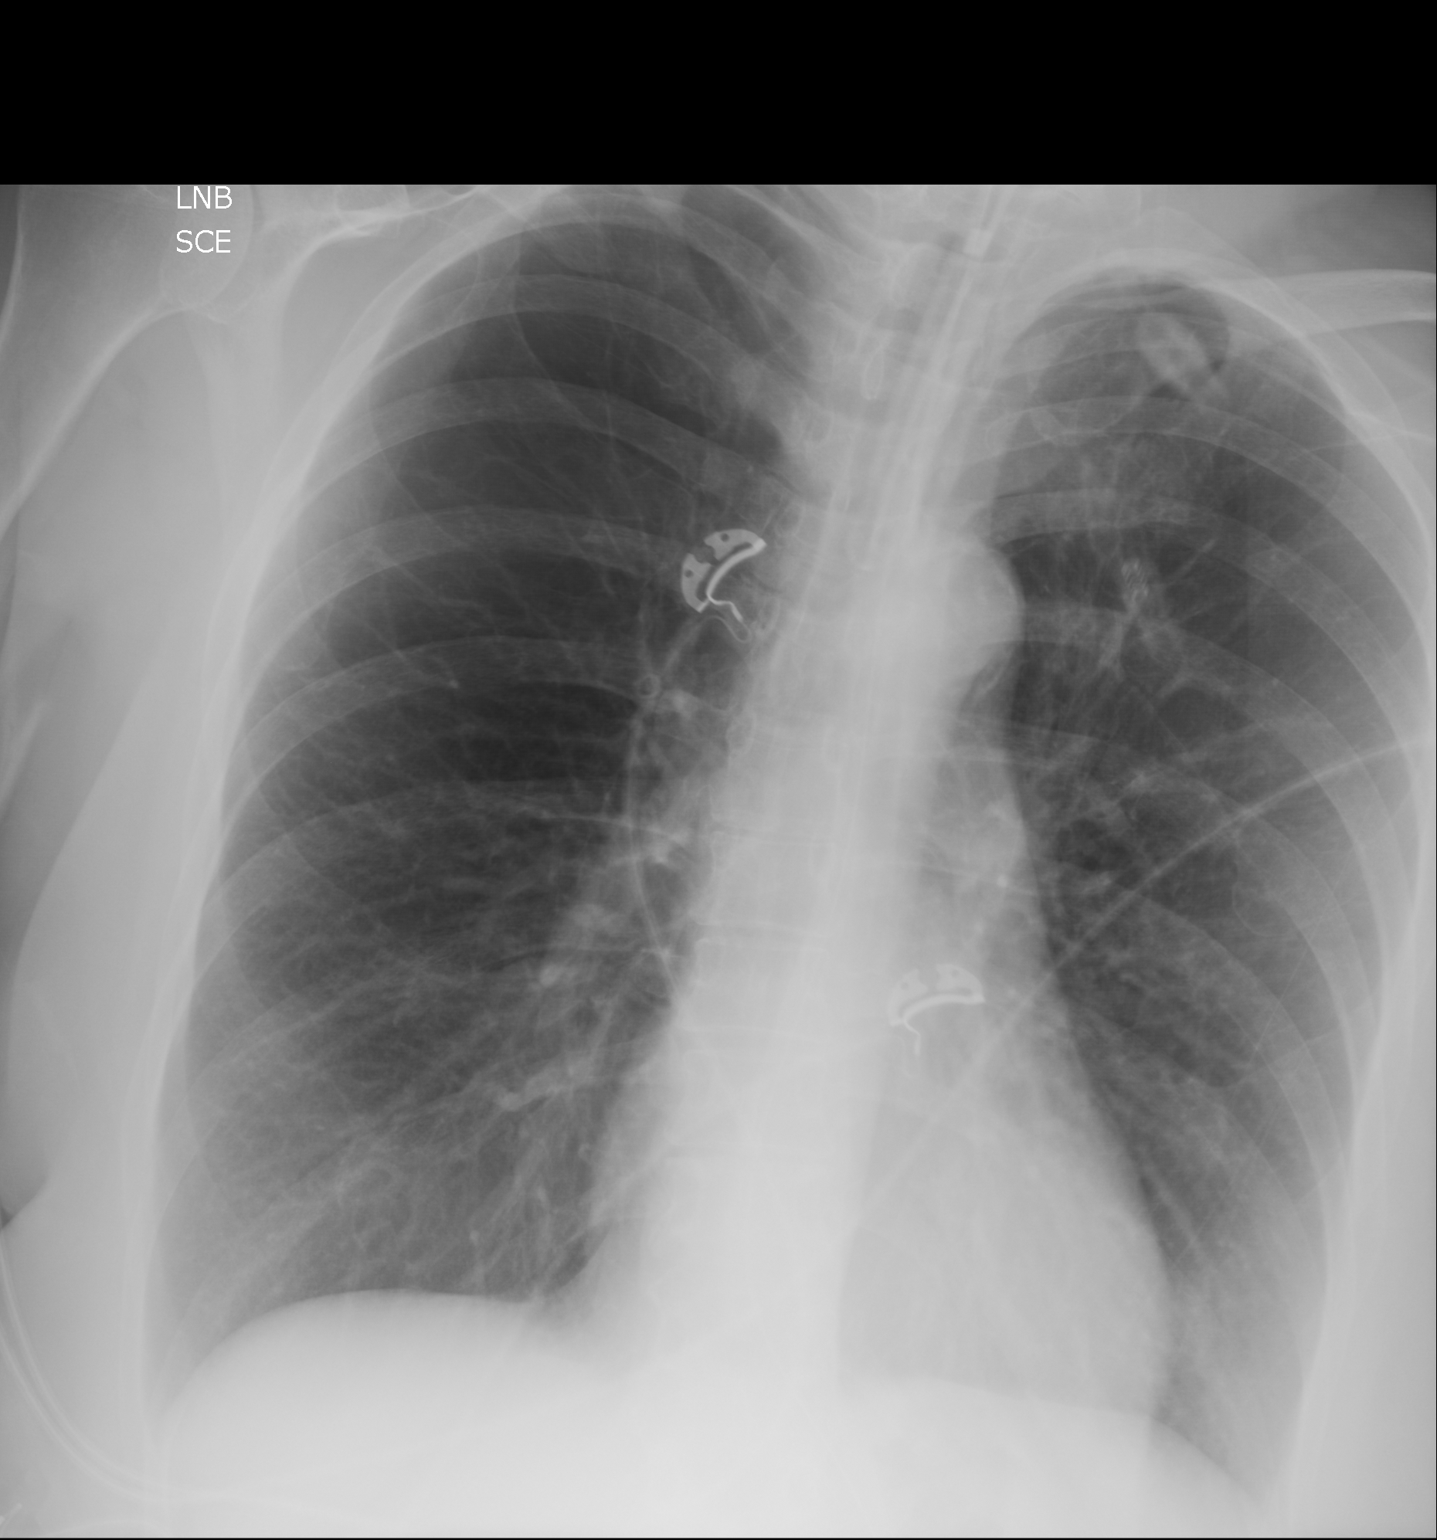

[1 of 1 positions shown; findings below may reference images not displayed]

FINDINGS: Endotracheal tube is 4 cm above the carina. Hyperinflation of the
lungs compatible with COPD. Heart is normal size. Lungs are clear.
No effusions or acute bony abnormality.
IMPRESSION: Endotracheal tube 4 cm above the carina.

COPD.  No active disease.

## 2015-09-09 ENCOUNTER — Ambulatory Visit: Payer: Disability Insurance | Attending: Dentistry

## 2015-09-09 DIAGNOSIS — F329 Major depressive disorder, single episode, unspecified: Secondary | ICD-10-CM

## 2015-09-09 DIAGNOSIS — J449 Chronic obstructive pulmonary disease, unspecified: Secondary | ICD-10-CM | POA: Diagnosis not present

## 2015-09-09 DIAGNOSIS — F32A Depression, unspecified: Secondary | ICD-10-CM

## 2015-09-09 DIAGNOSIS — F419 Anxiety disorder, unspecified: Secondary | ICD-10-CM

## 2015-09-09 IMAGING — CR DG CHEST 1V PORT
1 series · 2 of 2 positions shown · non-contrast
Comparison: December 29, 2014

CLINICAL DATA: Respiratory failure

EXAM:
PORTABLE CHEST - 1 VIEW

[Series 1: portable · 0.17mm/px · 2 of 2 slices shown]
[im 1/2]
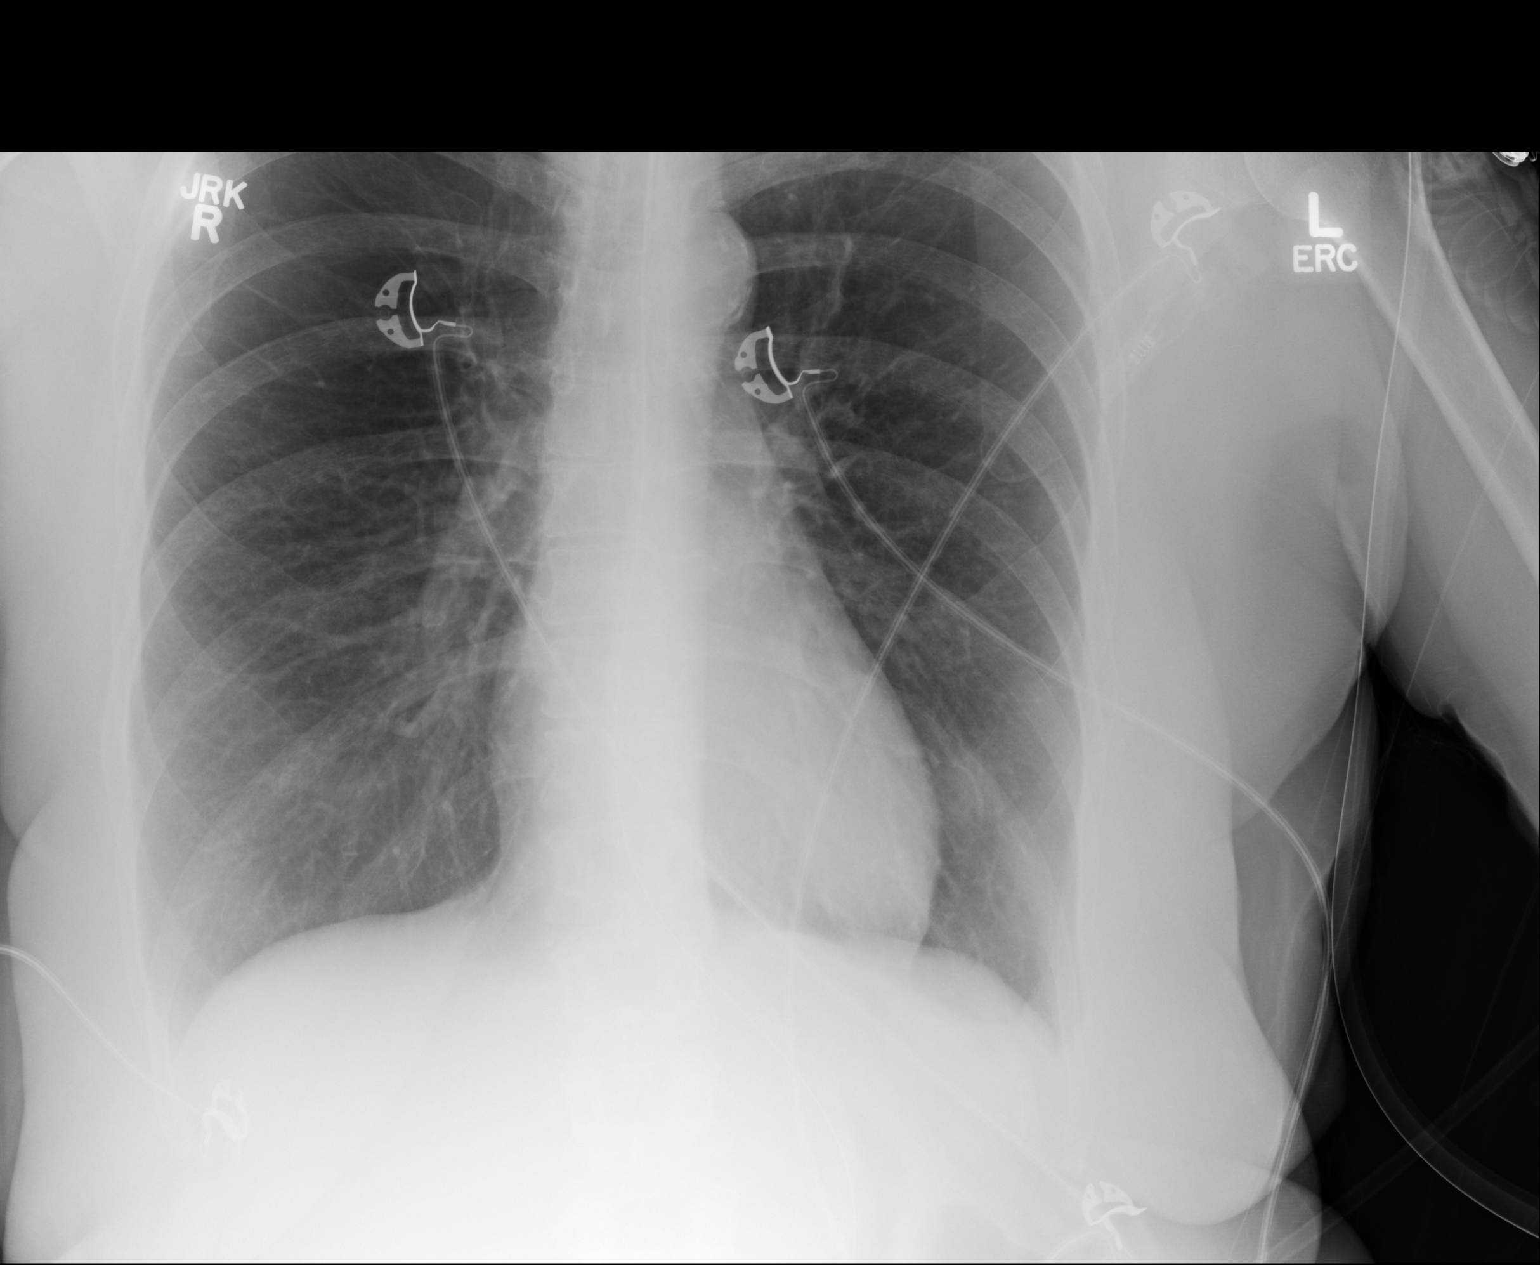
[im 2/2]
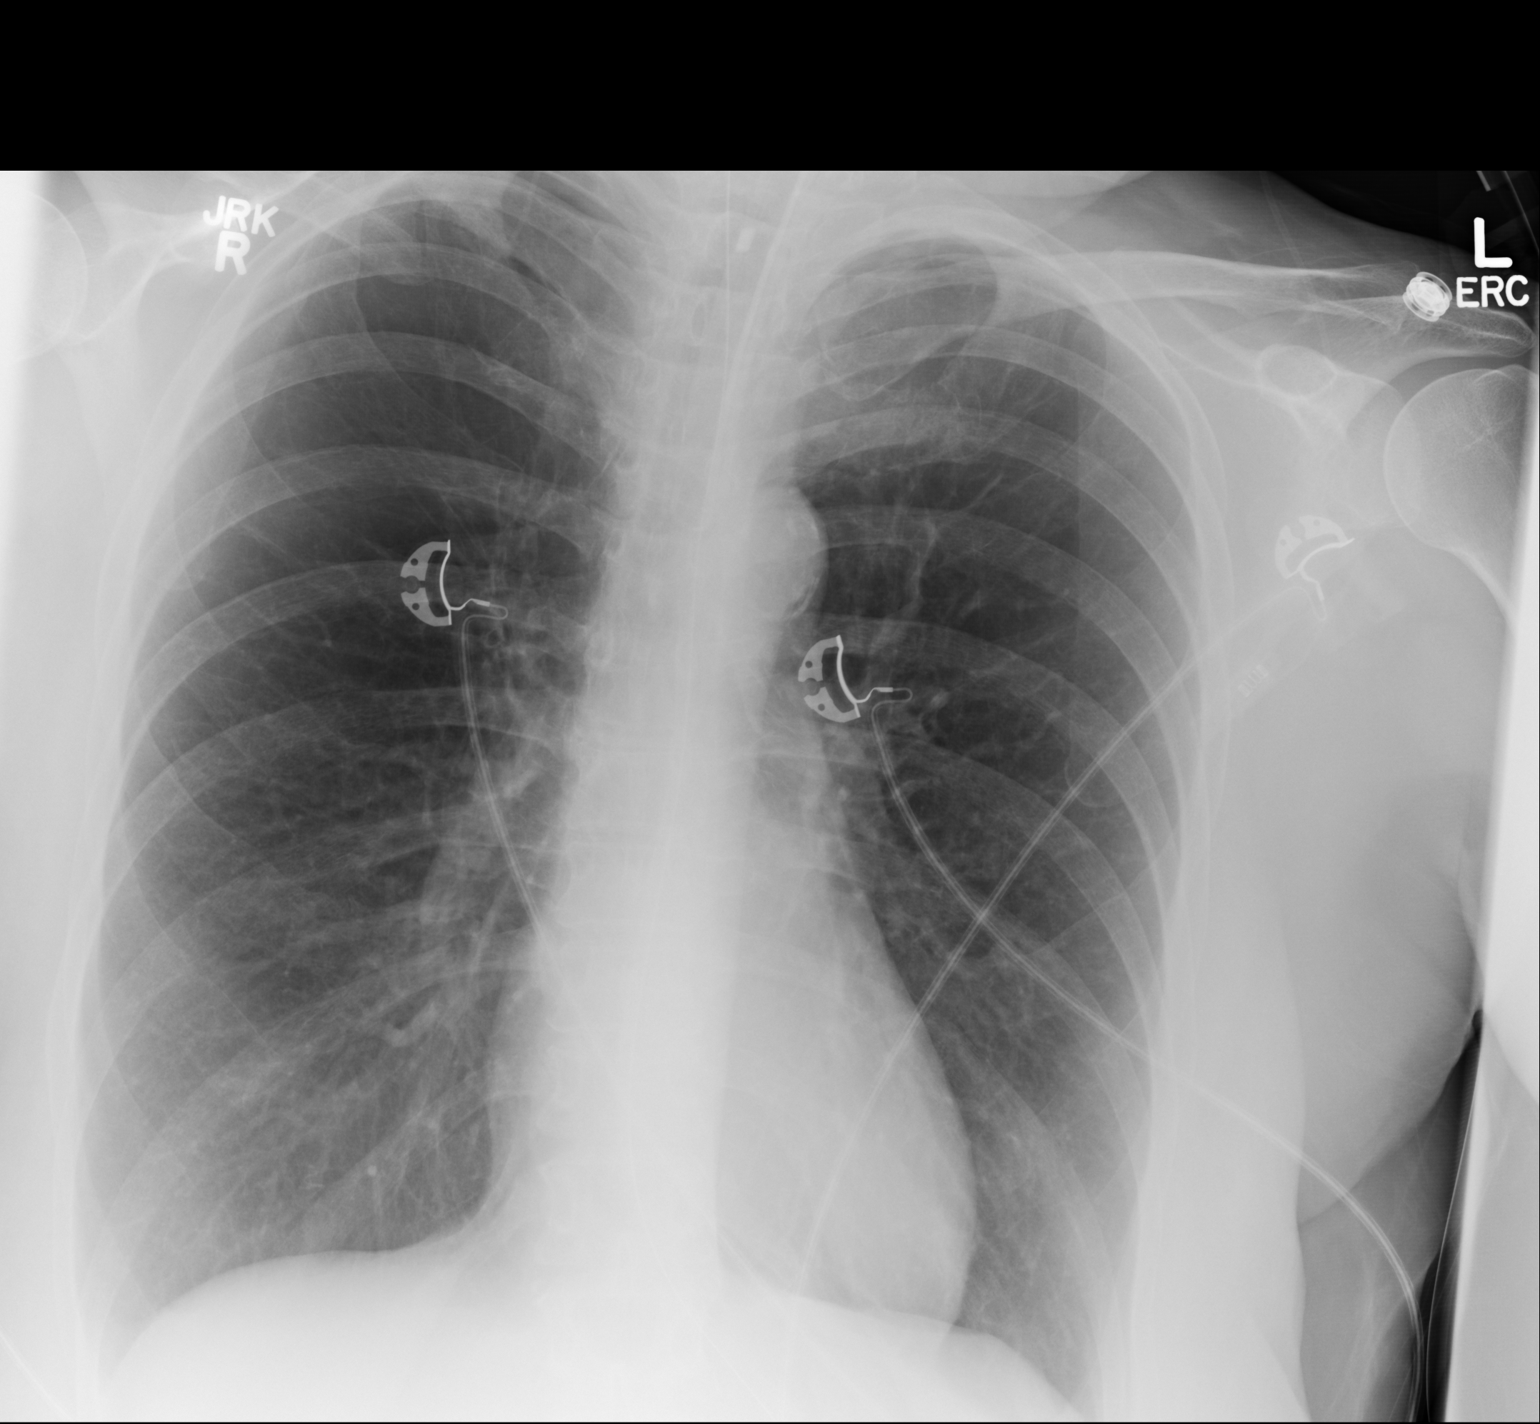

[2 of 2 positions shown; findings below may reference images not displayed]

FINDINGS: The heart size and mediastinal contours are within normal limits.
Endotracheal tube is identified distal tip 5.2 cm from carina.
Nasogastric is unchanged. Both lungs are clear. The lungs are
hyperinflated. The visualized skeletal structures are unremarkable.
IMPRESSION: No active cardiopulmonary disease.  Emphysema.

## 2015-09-09 MED ORDER — ALBUTEROL SULFATE (2.5 MG/3ML) 0.083% IN NEBU
2.5000 mg | INHALATION_SOLUTION | Freq: Once | RESPIRATORY_TRACT | Status: AC
  Administered 2015-09-09: 2.5 mg via RESPIRATORY_TRACT
  Filled 2015-09-09: qty 3

## 2015-09-10 IMAGING — CR DG CHEST 1V PORT
1 series · 1 of 1 positions shown · non-contrast
Comparison: 12/30/2014.

CLINICAL DATA: 59-year-old female with COPD, hypertension and
tobacco use on ventilator. Subsequent encounter.

EXAM:
PORTABLE CHEST - 1 VIEW

[portable]
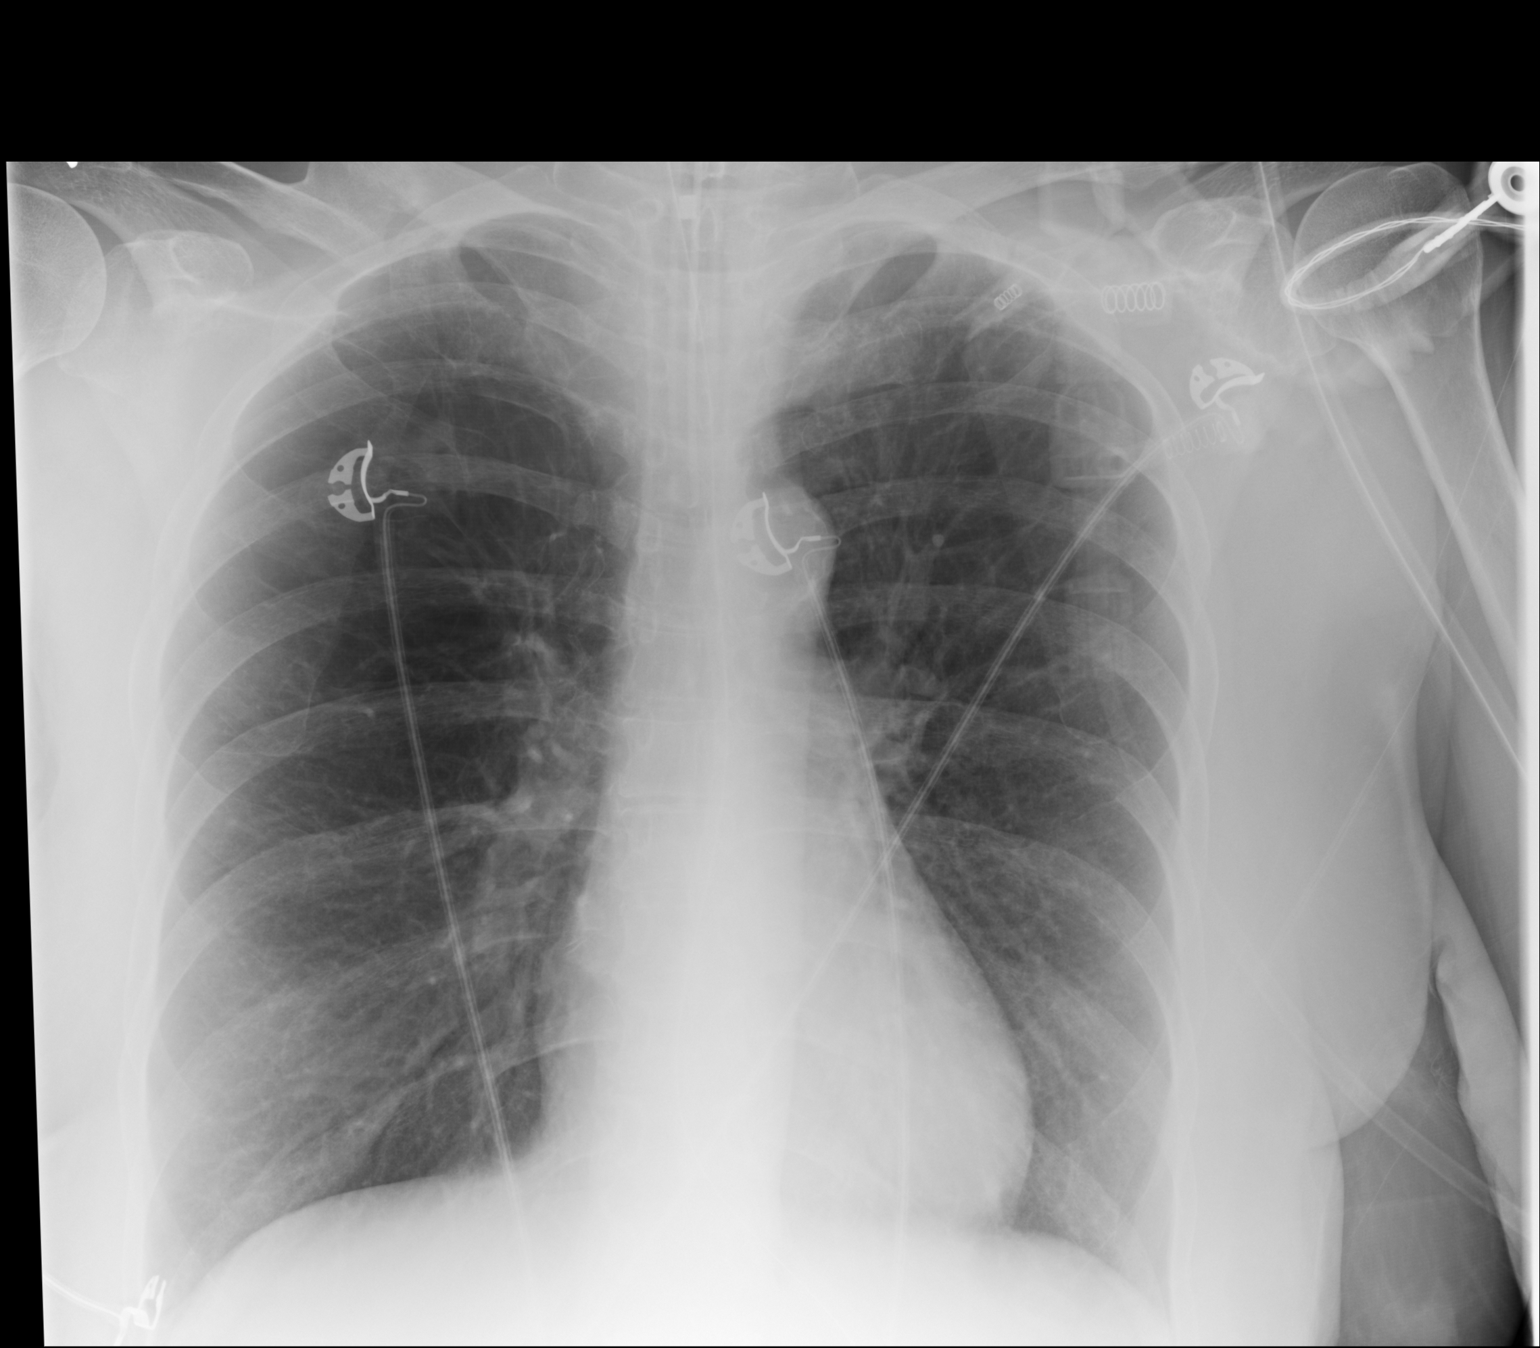

[1 of 1 positions shown; findings below may reference images not displayed]

FINDINGS: Endotracheal tube tip 4.6 cm above the carina. Nasogastric tube
courses below the diaphragm. Tip is not included on the present
exam.

Hyperinflation of the lungs. No segmental infiltrate, congestive
heart failure, gross pneumothorax or plain film evidence of
pulmonary malignancy.

Heart size within normal limits.

Calcified aorta.

Remote right clavicle fracture with bony overgrowth.
IMPRESSION: Hyperinflation of the lungs.

## 2015-09-20 ENCOUNTER — Emergency Department: Payer: Managed Care, Other (non HMO)

## 2015-09-20 ENCOUNTER — Emergency Department
Admission: EM | Admit: 2015-09-20 | Discharge: 2015-09-20 | Disposition: A | Discharge status: Home / Self Care | Payer: Managed Care, Other (non HMO) | Attending: Emergency Medicine | Admitting: Emergency Medicine

## 2015-09-20 ENCOUNTER — Encounter: Payer: Self-pay | Admitting: Emergency Medicine

## 2015-09-20 DIAGNOSIS — I10 Essential (primary) hypertension: Secondary | ICD-10-CM | POA: Diagnosis not present

## 2015-09-20 DIAGNOSIS — Z79899 Other long term (current) drug therapy: Secondary | ICD-10-CM | POA: Diagnosis not present

## 2015-09-20 DIAGNOSIS — E785 Hyperlipidemia, unspecified: Secondary | ICD-10-CM | POA: Insufficient documentation

## 2015-09-20 DIAGNOSIS — Z87891 Personal history of nicotine dependence: Secondary | ICD-10-CM | POA: Diagnosis not present

## 2015-09-20 DIAGNOSIS — F329 Major depressive disorder, single episode, unspecified: Secondary | ICD-10-CM | POA: Diagnosis not present

## 2015-09-20 DIAGNOSIS — R0781 Pleurodynia: Secondary | ICD-10-CM

## 2015-09-20 DIAGNOSIS — R05 Cough: Secondary | ICD-10-CM | POA: Diagnosis present

## 2015-09-20 DIAGNOSIS — J441 Chronic obstructive pulmonary disease with (acute) exacerbation: Secondary | ICD-10-CM | POA: Diagnosis not present

## 2015-09-20 LAB — COMPREHENSIVE METABOLIC PANEL
ALK PHOS: 56 U/L (ref 38–126)
ALT: 26 U/L (ref 14–54)
AST: 26 U/L (ref 15–41)
Albumin: 4.5 g/dL (ref 3.5–5.0)
Anion gap: 8 (ref 5–15)
BUN: 12 mg/dL (ref 6–20)
CALCIUM: 9.9 mg/dL (ref 8.9–10.3)
CO2: 32 mmol/L (ref 22–32)
CREATININE: 0.9 mg/dL (ref 0.44–1.00)
Chloride: 98 mmol/L — ABNORMAL LOW (ref 101–111)
Glucose, Bld: 95 mg/dL (ref 65–99)
Potassium: 4.1 mmol/L (ref 3.5–5.1)
Sodium: 138 mmol/L (ref 135–145)
Total Bilirubin: 0.5 mg/dL (ref 0.3–1.2)
Total Protein: 7.5 g/dL (ref 6.5–8.1)

## 2015-09-20 LAB — CBC WITH DIFFERENTIAL/PLATELET
Basophils Absolute: 0 10*3/uL (ref 0–0.1)
Basophils Relative: 0 %
Eosinophils Absolute: 0.3 10*3/uL (ref 0–0.7)
Eosinophils Relative: 2 %
HCT: 40.7 % (ref 35.0–47.0)
HEMOGLOBIN: 13.8 g/dL (ref 12.0–16.0)
LYMPHS ABS: 1.6 10*3/uL (ref 1.0–3.6)
MCH: 30.1 pg (ref 26.0–34.0)
MCHC: 33.8 g/dL (ref 32.0–36.0)
MCV: 89.1 fL (ref 80.0–100.0)
Monocytes Absolute: 0.5 10*3/uL (ref 0.2–0.9)
Monocytes Relative: 4 %
Neutro Abs: 8.5 10*3/uL — ABNORMAL HIGH (ref 1.4–6.5)
Platelets: 306 10*3/uL (ref 150–440)
RBC: 4.57 MIL/uL (ref 3.80–5.20)
RDW: 13.3 % (ref 11.5–14.5)
WBC: 10.8 10*3/uL (ref 3.6–11.0)

## 2015-09-20 LAB — TROPONIN I

## 2015-09-20 LAB — FIBRIN DERIVATIVES D-DIMER (ARMC ONLY): FIBRIN DERIVATIVES D-DIMER (ARMC): 507 — AB (ref 0–499)

## 2015-09-20 MED ORDER — PREDNISONE 20 MG PO TABS
40.0000 mg | ORAL_TABLET | Freq: Once | ORAL | Status: AC
  Administered 2015-09-20: 40 mg via ORAL
  Filled 2015-09-20: qty 2

## 2015-09-20 MED ORDER — ALPRAZOLAM 0.5 MG PO TABS
0.5000 mg | ORAL_TABLET | ORAL | Status: AC
  Administered 2015-09-20: 0.5 mg via ORAL
  Filled 2015-09-20: qty 1

## 2015-09-20 MED ORDER — DOXYCYCLINE HYCLATE 100 MG PO CAPS: 100.0000 mg | ORAL_CAPSULE | Freq: Two times a day (BID) | ORAL | Status: DC

## 2015-09-20 MED ORDER — IPRATROPIUM-ALBUTEROL 0.5-2.5 (3) MG/3ML IN SOLN
3.0000 mL | Freq: Once | RESPIRATORY_TRACT | Status: AC
  Administered 2015-09-20: 3 mL via RESPIRATORY_TRACT

## 2015-09-20 MED ORDER — IOPAMIDOL (ISOVUE-370) INJECTION 76%
75.0000 mL | Freq: Once | INTRAVENOUS | Status: AC | PRN
  Administered 2015-09-20: 75 mL via INTRAVENOUS

## 2015-09-20 MED ORDER — PREDNISONE 20 MG PO TABS: 40.0000 mg | ORAL_TABLET | Freq: Every day | ORAL | Status: DC

## 2015-09-20 MED ORDER — IPRATROPIUM-ALBUTEROL 0.5-2.5 (3) MG/3ML IN SOLN
3.0000 mL | Freq: Once | RESPIRATORY_TRACT | Status: AC
  Administered 2015-09-20: 3 mL via RESPIRATORY_TRACT
  Filled 2015-09-20: qty 3

## 2015-09-20 MED ORDER — HYDROCODONE-ACETAMINOPHEN 5-325 MG PO TABS
1.0000 | ORAL_TABLET | ORAL | Status: AC
  Administered 2015-09-20: 1 via ORAL
  Filled 2015-09-20: qty 1

## 2015-09-20 MED ORDER — DOXYCYCLINE HYCLATE 100 MG PO TABS
100.0000 mg | ORAL_TABLET | Freq: Once | ORAL | Status: AC
  Administered 2015-09-20: 100 mg via ORAL
  Filled 2015-09-20: qty 1

## 2015-09-20 MED ORDER — HYDROCODONE-ACETAMINOPHEN 5-325 MG PO TABS: 1.0000 | ORAL_TABLET | Freq: Four times a day (QID) | ORAL | Status: DC | PRN

## 2015-09-20 MED ORDER — IPRATROPIUM-ALBUTEROL 0.5-2.5 (3) MG/3ML IN SOLN
RESPIRATORY_TRACT | Status: AC
  Filled 2015-09-20: qty 3

## 2015-09-20 NOTE — ED Notes (Signed)
Pt discharged to home.  Family member driving.  Discharge instructions reviewed.  Verbalized understanding.  No questions or concerns at this time.  Teach back verified.  Pt in NAD.  No items left in ED.   

## 2015-09-20 NOTE — Discharge Instructions (Signed)
We believe that your symptoms are caused today by an exacerbation of your COPD, and possibly bronchitis.  Please take the prescribed medications and any medications that you have at home for your COPD.  Follow up with your doctor as recommended.  If you develop any new or worsening symptoms, including but not limited to fever, persistent vomiting, worsening shortness of breath, or other symptoms that concern you, please return to the Emergency Department immediately. ° ° °Chronic Obstructive Pulmonary Disease Exacerbation °Chronic obstructive pulmonary disease (COPD) is a common lung condition in which airflow from the lungs is limited. COPD is a general term that can be used to describe many different lung problems that limit airflow, including chronic bronchitis and emphysema. COPD exacerbations are episodes when breathing symptoms become much worse and require extra treatment. Without treatment, COPD exacerbations can be life threatening, and frequent COPD exacerbations can cause further damage to your lungs. °CAUSES °· Respiratory infections. °· Exposure to smoke. °· Exposure to air pollution, chemical fumes, or dust. °Sometimes there is no apparent cause or trigger. °RISK FACTORS °· Smoking cigarettes. °· Older age. °· Frequent prior COPD exacerbations. °SIGNS AND SYMPTOMS °· Increased coughing. °· Increased thick spit (sputum) production. °· Increased wheezing. °· Increased shortness of breath. °· Rapid breathing. °· Chest tightness. °DIAGNOSIS °Your medical history, a physical exam, and tests will help your health care provider make a diagnosis. Tests may include: °· A chest X-ray. °· Basic lab tests. °· Sputum testing. °· An arterial blood gas test. °TREATMENT °Depending on the severity of your COPD exacerbation, you may need to be admitted to a hospital for treatment. Some of the treatments commonly used to treat COPD exacerbations are:  °· Antibiotic medicines. °· Bronchodilators. These are drugs that  expand the air passages. They may be given with an inhaler or nebulizer. Spacer devices may be needed to help improve drug delivery. °· Corticosteroid medicines. °· Supplemental oxygen therapy. °· Airway clearing techniques, such as noninvasive ventilation (NIV) and positive expiratory pressure (PEP). These provide respiratory support through a mask or other noninvasive device. °HOME CARE INSTRUCTIONS °· Do not smoke. Quitting smoking is very important to prevent COPD from getting worse and exacerbations from happening as often. °· Avoid exposure to all substances that irritate the airway, especially to tobacco smoke. °· If you were prescribed an antibiotic medicine, finish it all even if you start to feel better. °· Take all medicines as directed by your health care provider. It is important to use correct technique with inhaled medicines. °· Drink enough fluids to keep your urine clear or pale yellow (unless you have a medical condition that requires fluid restriction). °· Use a cool mist vaporizer. This makes it easier to clear your chest when you cough. °· If you have a home nebulizer and oxygen, continue to use them as directed. °· Maintain all necessary vaccinations to prevent infections. °· Exercise regularly. °· Eat a healthy diet. °· Keep all follow-up appointments as directed by your health care provider. °SEEK IMMEDIATE MEDICAL CARE IF: °· You have worsening shortness of breath. °· You have trouble talking. °· You have severe chest pain. °· You have blood in your sputum. °· You have a fever. °· You have weakness, vomit repeatedly, or faint. °· You feel confused. °· You continue to get worse. °MAKE SURE YOU: °· Understand these instructions. °· Will watch your condition. °· Will get help right away if you are not doing well or get worse. °  °This information is not   intended to replace advice given to you by your health care provider. Make sure you discuss any questions you have with your health care  provider. °  °Document Released: 02/14/2007 Document Revised: 05/10/2014 Document Reviewed: 12/22/2012 °Elsevier Interactive Patient Education ©2016 [employer] ° °

## 2015-09-20 NOTE — ED Notes (Signed)
Cough x 2 days, increased exertional dyspnea and back pain x 1 week, uses home 02, using 2l per minute.
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
## 2015-09-22 ENCOUNTER — Telehealth: Payer: Self-pay | Admitting: Internal Medicine

## 2015-09-22 NOTE — Telephone Encounter (Signed)
Caller name: Ander Purpuraeggy Taha Relationship to patient: Patient Can be reached: [phone]  Reason for call: pt needs HFU. Dx COPD, HTN. D/c 09/20/15. Pt request Friday appt after 11:30

## 2015-09-22 NOTE — Telephone Encounter (Signed)
ED visit does not qualify for TCM.  Will continue to follow and schedule as appropriate.

## 2015-09-24 ENCOUNTER — Other Ambulatory Visit: Payer: Self-pay | Admitting: Internal Medicine

## 2015-09-26 ENCOUNTER — Telehealth: Payer: Self-pay | Admitting: Pulmonary Disease

## 2015-09-26 ENCOUNTER — Ambulatory Visit (INDEPENDENT_AMBULATORY_CARE_PROVIDER_SITE_OTHER): Payer: Managed Care, Other (non HMO) | Admitting: Internal Medicine

## 2015-09-26 ENCOUNTER — Encounter: Payer: Self-pay | Admitting: Internal Medicine

## 2015-09-26 VITALS — BP 140/90 | HR 74 | Temp 98.2°F | Wt 166.8 lb

## 2015-09-26 DIAGNOSIS — F329 Major depressive disorder, single episode, unspecified: Secondary | ICD-10-CM | POA: Diagnosis not present

## 2015-09-26 DIAGNOSIS — J441 Chronic obstructive pulmonary disease with (acute) exacerbation: Secondary | ICD-10-CM | POA: Diagnosis not present

## 2015-09-26 DIAGNOSIS — Z87891 Personal history of nicotine dependence: Secondary | ICD-10-CM | POA: Diagnosis not present

## 2015-09-26 DIAGNOSIS — F32A Depression, unspecified: Secondary | ICD-10-CM

## 2015-09-26 DIAGNOSIS — J9611 Chronic respiratory failure with hypoxia: Secondary | ICD-10-CM | POA: Diagnosis not present

## 2015-09-26 DIAGNOSIS — I10 Essential (primary) hypertension: Secondary | ICD-10-CM

## 2015-09-26 MED ORDER — LOSARTAN POTASSIUM 50 MG PO TABS: 50.0000 mg | ORAL_TABLET | Freq: Every day | ORAL | Status: DC

## 2015-09-26 MED ORDER — PREDNISONE 10 MG PO TABS: ORAL_TABLET | ORAL | Status: DC

## 2015-09-26 MED ORDER — HYDROCOD POLST-CPM POLST ER 10-8 MG/5ML PO SUER: 5.0000 mL | Freq: Every evening | ORAL | Status: DC | PRN
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
## 2015-09-26 NOTE — Patient Instructions (Addendum)
Stop the mirtazipine .  It increases appetite  We are going to resume the losartan at 50 mg daily.   Drop the metoprolol back to once daily for the first week.. Then  1/2 tablet daily or whole tablet  every other day for one week,  Then stop    If BP is > 140/80 in 3 weeks,  We will increase losartan to 100 mg   Use your nebs every 3 hours if needed

## 2015-09-26 NOTE — Telephone Encounter (Signed)
Received Request from [employer] for patient records .  Sent via inter office mail to [employer] [facility] [employer] [facility].

## 2015-09-26 NOTE — Progress Notes (Signed)
Subjective:  Patient ID: Diane [name], female    DOB: 1955-06-01  Age: 60 y.o. MRN: [id number]  CC: The primary encounter diagnosis was History of tobacco abuse. Diagnoses of Chronic hypoxemic respiratory failure (HCC), COPD exacerbation (HCC), Depression, and Essential hypertension were also pertinent to this visit.  HPI SHYAN SCALISI presents for Er follow up on COPD exacerbation that was treated in ED on May 20th . CTA done to rule out PE Treated with 40 mg prednisone daily x 5 days,  doxycycline and #15 vicodin for cough/pain . Still having rib pain in the back but her  breathing is a little better,  More ambulatory although only by a few feet compared to a few days ago.  Wheezing and dyspnea persist and are improved transiently by albuterol nebs last 3-4 hours . The vicodin is not taking care of the pain and keeping her awake .  Taking the prednisone early in the day   Has not worked since July,  Due to severe COPD .   Weight gain of 21 lbs since January .  Attributed to daily use of and initiation of SSRI therapy for depression.  She is sedentary, ability to exercise is limited by severe COPD .   Has not smoked since August  2016  When she was intubated for acute respiratory failure    Outpatient Prescriptions Prior to Visit  Medication Sig Dispense Refill  . albuterol (PROVENTIL) (2.5 MG/3ML) 0.083% nebulizer solution Take 3 mLs (2.5 mg total) by nebulization every 4 (four) hours as needed. 75 mL 3  . atorvastatin (LIPITOR) 20 MG tablet TAKE ONE TABLET BY MOUTH ONCE DAILY 90 tablet 3  . Cholecalciferol (VITAMIN D-3 PO) Take 2,000 Units by mouth daily.    . clonazePAM (KLONOPIN) 0.5 MG tablet Take 1 tablet (0.5 mg total) by mouth 2 (two) times daily as needed. for anxiety 60 tablet 5  . doxycycline (VIBRAMYCIN) 100 MG capsule Take 1 capsule (100 mg total) by mouth 2 (two) times daily. 20 capsule 0  . Eszopiclone (ESZOPICLONE) 3 MG TABS Take 1 tablet (3 mg total) by mouth at bedtime.  Reported on 05/19/2015 30 tablet 5  . famotidine (PEPCID) 20 MG tablet Take 20 mg by mouth 2 (two) times daily. Reported on 05/19/2015    . Fluticasone Furoate-Vilanterol (BREO ELLIPTA) 200-25 MCG/INH AEPB Inhale 1 puff into the lungs daily. 60 each 5  . guaiFENesin (MUCINEX) 600 MG 12 hr tablet Take 1 tablet (600 mg total) by mouth 2 (two) times daily. (Patient taking differently: Take 600 mg by mouth 2 (two) times daily as needed. ) 30 tablet 0  . HYDROcodone-acetaminophen (NORCO/VICODIN) 5-325 MG tablet Take 1 tablet by mouth every 6 (six) hours as needed for moderate pain. 15 tablet 0  . ipratropium-albuterol (DUONEB) 0.5-2.5 (3) MG/3ML SOLN Take 3 mLs by nebulization every 4 (four) hours as needed. 360 mL 4  . Loratadine 10 MG CAPS Take 1 capsule by mouth daily. In am.    . Melatonin 5 MG TABS Take 5 mg by mouth at bedtime.    . metoprolol succinate (TOPROL XL) 50 MG 24 hr tablet Take 1 tablet (50 mg total) by mouth 2 (two) times daily. Take with or immediately following a meal. 60 tablet 5  . mirtazapine (REMERON) 15 MG tablet Take 1 tablet (15 mg total) by mouth at bedtime. 30 tablet 3  . Multiple Vitamin (MULTIVITAMIN) capsule Take 1 capsule by mouth daily.    . predniSONE (DELTASONE)  10 MG tablet Take 5 mg daily 60 tablet 0  . senna (SENOKOT) 8.6 MG tablet Take 1 tablet by mouth daily.    . sertraline (ZOLOFT) 100 MG tablet Take 1 tablet (100 mg total) by mouth daily. 30 tablet 5  . Tiotropium Bromide Monohydrate (SPIRIVA RESPIMAT) 2.5 MCG/ACT AERS Inhale 1 puff into the lungs daily. 4 g 11  . VENTOLIN HFA 108 (90 BASE) MCG/ACT inhaler     . predniSONE (DELTASONE) 20 MG tablet Take 2 tablets (40 mg total) by mouth daily with breakfast. 10 tablet 0   No facility-administered medications prior to visit.    Review of Systems;  Patient denies headache, fevers, malaise, unintentional weight loss, skin rash, eye pain, sinus congestion and sinus pain, sore throat, dysphagia,  hemoptysis ,  cough, dyspnea, wheezing, chest pain, palpitations, orthopnea, edema, abdominal pain, nausea, melena, diarrhea, constipation, flank pain, dysuria, hematuria, urinary  Frequency, nocturia, numbness, tingling, seizures,  Focal weakness, Loss of consciousness,  Tremor, insomnia, depression, anxiety, and suicidal ideation.      Objective:  BP 140/90 mmHg  Pulse 74  Temp(Src) 98.2 F (36.8 C) (Oral)  Wt 166 lb 12.8 oz (75.66 kg)  SpO2 98%  BP Readings from Last 3 Encounters:  09/26/15 140/90  09/20/15 149/99  08/18/15 142/82    Wt Readings from Last 3 Encounters:  09/26/15 166 lb 12.8 oz (75.66 kg)  09/20/15 163 lb (73.936 kg)  08/18/15 160 lb 3.2 oz (72.666 kg)    General appearance: alert, cooperative and appears older than stated age.  NAD, not using accessory muscles.  Ears: normal TM's and external ear canals both ears Throat: lips, mucosa, and tongue normal; teeth and gums normal Neck: no adenopathy, no carotid bruit, supple, symmetrical, trachea midline and thyroid not enlarged, symmetric, no tenderness/mass/nodules Back: symmetric, no curvature. ROM normal. No CVA tenderness. Lungs: limited air movement,  Wheezing noted  Heart: regular rate and rhythm, S1, S2 normal, no murmur, click, rub or gallop Abdomen: soft, non-tender; bowel sounds normal; no masses,  no organomegaly Pulses: 2+ and symmetric Skin: Skin color, texture, turgor normal. No rashes or lesions Lymph nodes: Cervical, supraclavicular, and axillary nodes normal.  Lab Results  Component Value Date   HGBA1C 5.4 08/22/2014    Lab Results  Component Value Date   CREATININE 0.90 09/20/2015   CREATININE 0.69 07/13/2015   CREATININE 0.72 05/19/2015    Lab Results  Component Value Date   WBC 10.8 09/20/2015   HGB 13.8 09/20/2015   HCT 40.7 09/20/2015   PLT 306 09/20/2015   GLUCOSE 95 09/20/2015   CHOL 364* 05/19/2015   TRIG 217.0* 05/19/2015   HDL 80.40 05/19/2015   LDLDIRECT 230.0 05/19/2015    LDLCALC 156* 01/10/2014   ALT 26 09/20/2015   AST 26 09/20/2015   NA 138 09/20/2015   K 4.1 09/20/2015   CL 98* 09/20/2015   CREATININE 0.90 09/20/2015   BUN 12 09/20/2015   CO2 32 09/20/2015   TSH 3.18 05/19/2015   INR 1.06 01/02/2015   HGBA1C 5.4 08/22/2014    Dg Chest 2 View  09/20/2015  CLINICAL DATA:  Cough for 2 days, increased exertional dyspnea and back pain for 1 week. History of COPD. EXAM: CHEST  2 VIEW COMPARISON:  Chest x-rays dated 07/13/2015 and 06/22/2015. FINDINGS: Heart size is normal. Overall cardiomediastinal silhouette is stable in size and configuration. Lungs are clear. Lungs are hyperexpanded, compatible with given history of COPD. No evidence of pneumonia. No pleural effusion or  pneumothorax seen. Mild degenerative change again noted within the slightly kyphotic thoracic spine. No acute- appearing osseous abnormality. IMPRESSION: 1. Lungs are clear and there is no evidence of acute cardiopulmonary abnormality. No evidence of pneumonia. 2. Hyperexpanded lungs indicating COPD/emphysema. Electronically Signed   By: Bary Richard M.D.   On: 09/20/2015 15:16   Ct Angio Chest Pe W/cm &/or Wo Cm  09/20/2015  CLINICAL DATA:  Cough for 2 days. EXAM: CT ANGIOGRAPHY CHEST WITH CONTRAST TECHNIQUE: Multidetector CT imaging of the chest was performed using the standard protocol during bolus administration of intravenous contrast. Multiplanar CT image reconstructions and MIPs were obtained to evaluate the vascular anatomy. CONTRAST:  75 mL of Isovue 370 COMPARISON:  None. FINDINGS: The central airways are normal. No pneumothorax. Significant emphysematous changes seen in the lungs. No suspicious pulmonary nodules, masses, or infiltrates. No effusions. No adenopathy. Coronary artery calcifications are seen. The thoracic aorta is normal in caliber with no aneurysm or dissection. The pulmonary arteries demonstrate no pulmonary emboli. Thickening of the left adrenal gland is likely due to  either adenomas or hyperplasia. Limited views of the upper abdomen are otherwise within normal limits. There is loss of height of 2 mid and 1 lower thoracic vertebral body. This is age indeterminate but may be chronic. Review of the MIP images confirms the above findings. IMPRESSION: 1. No pulmonary emboli. 2. Emphysematous changes in the lungs. No other acute abnormalities. Electronically Signed   By: Gerome Sam III M.D   On: 09/20/2015 20:27    Assessment & Plan:   Problem List Items Addressed This Visit    Depression    Improved outlook .  given excessive weight gain , will stop mirtazapine .      Hypertension    Adding losartan and tapering off of metoprolol       Relevant Medications   losartan (COZAAR) 50 MG tablet   Chronic hypoxemic respiratory failure (HCC)    Her 02 requirement is chronic but currently satting 98% on 2 L.       COPD exacerbation (HCC)    No significant improvement since May 20th ,but not worse.  Will increased prednisone from current dose to 60 mg daily x 3, then taper by 10 mg daily until gone.  tussionex refilled.  Use albuterol every 3 hours prn       Relevant Medications   predniSONE (DELTASONE) 10 MG tablet   chlorpheniramine-HYDROcodone (TUSSIONEX PENNKINETIC ER) 10-8 MG/5ML SUER   History of tobacco abuse - Primary    Quit in Sep 03, 2016after she nearly died        .[id number] I am having Ms. Backhaus start on predniSONE, chlorpheniramine-HYDROcodone, and losartan. I am also having her maintain her Melatonin, famotidine, Loratadine, multivitamin, senna, Cholecalciferol (VITAMIN D-3 PO), VENTOLIN HFA, fluticasone furoate-vilanterol, mirtazapine, metoprolol succinate, sertraline, clonazePAM, Eszopiclone, guaiFENesin, Tiotropium Bromide Monohydrate, ipratropium-albuterol, albuterol, predniSONE, doxycycline, HYDROcodone-acetaminophen, and atorvastatin.  Meds ordered this encounter  Medications  . predniSONE (DELTASONE) 10 MG tablet    Sig: 6 tablets  daily for 3 days, then taper by 10 mg every 2 days until gone    Dispense:  48 tablet    Refill:  0  . chlorpheniramine-HYDROcodone (TUSSIONEX PENNKINETIC ER) 10-8 MG/5ML SUER    Sig: Take 5 mLs by mouth at bedtime as needed for cough.    Dispense:  200 mL    Refill:  0  . losartan (COZAAR) 50 MG tablet    Sig: Take 1 tablet (50 mg total)  by mouth daily.    Dispense:  90 tablet    Refill:  3    Medications Discontinued During This Encounter  Medication Reason  . predniSONE (DELTASONE) 20 MG tablet     Follow-up: Return in about 2 years (around 09/25/2017).   Sherlene Shams, MD

## 2015-09-26 NOTE — Progress Notes (Signed)
Pre visit review using our clinic review tool, if applicable. No additional management support is needed unless otherwise documented below in the visit note. 

## 2015-09-26 NOTE — Telephone Encounter (Signed)
err

## 2015-09-27 DIAGNOSIS — J441 Chronic obstructive pulmonary disease with (acute) exacerbation: Secondary | ICD-10-CM | POA: Insufficient documentation

## 2015-09-27 NOTE — Assessment & Plan Note (Signed)
No significant improvement since May 20th ,but not worse.  Will increased prednisone from current dose to 60 mg daily x 3, then taper by 10 mg daily until gone.  tussionex refilled.  Use albuterol every 3 hours prn

## 2015-09-27 NOTE — Assessment & Plan Note (Signed)
Improved outlook .  given excessive weight gain , will stop mirtazapine .

## 2015-09-27 NOTE — Assessment & Plan Note (Signed)
Her 02 requirement is chronic but currently satting 98% on 2 L.

## 2015-09-27 NOTE — Assessment & Plan Note (Signed)
Adding losartan and tapering off of metoprolol

## 2015-10-01 ENCOUNTER — Telehealth: Payer: Self-pay | Admitting: Internal Medicine

## 2015-10-01 NOTE — Telephone Encounter (Signed)
Rec'd from [facility] forward 7 pages to Dr.Tullo

## 2015-10-09 ENCOUNTER — Other Ambulatory Visit: Payer: Self-pay | Admitting: Internal Medicine

## 2015-10-09 ENCOUNTER — Other Ambulatory Visit: Payer: Self-pay | Admitting: Pulmonary Disease

## 2015-10-12 ENCOUNTER — Emergency Department
Admission: EM | Admit: 2015-10-12 | Discharge: 2015-10-12 | Disposition: A | Discharge status: Home / Self Care | Payer: Managed Care, Other (non HMO) | Attending: Emergency Medicine | Admitting: Emergency Medicine

## 2015-10-12 ENCOUNTER — Encounter: Payer: Self-pay | Admitting: Emergency Medicine

## 2015-10-12 DIAGNOSIS — I479 Paroxysmal tachycardia, unspecified: Secondary | ICD-10-CM | POA: Insufficient documentation

## 2015-10-12 DIAGNOSIS — Z7952 Long term (current) use of systemic steroids: Secondary | ICD-10-CM | POA: Diagnosis not present

## 2015-10-12 DIAGNOSIS — J9611 Chronic respiratory failure with hypoxia: Secondary | ICD-10-CM | POA: Insufficient documentation

## 2015-10-12 DIAGNOSIS — I10 Essential (primary) hypertension: Secondary | ICD-10-CM | POA: Diagnosis not present

## 2015-10-12 DIAGNOSIS — J441 Chronic obstructive pulmonary disease with (acute) exacerbation: Secondary | ICD-10-CM | POA: Diagnosis not present

## 2015-10-12 DIAGNOSIS — Z87891 Personal history of nicotine dependence: Secondary | ICD-10-CM | POA: Insufficient documentation

## 2015-10-12 DIAGNOSIS — Z79899 Other long term (current) drug therapy: Secondary | ICD-10-CM | POA: Insufficient documentation

## 2015-10-12 DIAGNOSIS — E785 Hyperlipidemia, unspecified: Secondary | ICD-10-CM | POA: Insufficient documentation

## 2015-10-12 DIAGNOSIS — R0789 Other chest pain: Secondary | ICD-10-CM | POA: Diagnosis present

## 2015-10-12 DIAGNOSIS — F329 Major depressive disorder, single episode, unspecified: Secondary | ICD-10-CM | POA: Insufficient documentation

## 2015-10-12 LAB — BASIC METABOLIC PANEL
ANION GAP: 9 (ref 5–15)
BUN: 7 mg/dL (ref 6–20)
CALCIUM: 9.3 mg/dL (ref 8.9–10.3)
CO2: 28 mmol/L (ref 22–32)
CREATININE: 0.81 mg/dL (ref 0.44–1.00)
Chloride: 99 mmol/L — ABNORMAL LOW (ref 101–111)
GLUCOSE: 85 mg/dL (ref 65–99)
Potassium: 3.6 mmol/L (ref 3.5–5.1)
Sodium: 136 mmol/L (ref 135–145)

## 2015-10-12 LAB — CBC
HCT: 39.7 % (ref 35.0–47.0)
Hemoglobin: 13.4 g/dL (ref 12.0–16.0)
MCH: 30.1 pg (ref 26.0–34.0)
MCHC: 33.7 g/dL (ref 32.0–36.0)
MCV: 89.3 fL (ref 80.0–100.0)
PLATELETS: 255 10*3/uL (ref 150–440)
RBC: 4.45 MIL/uL (ref 3.80–5.20)
RDW: 13.3 % (ref 11.5–14.5)
WBC: 13.3 10*3/uL — AB (ref 3.6–11.0)

## 2015-10-12 LAB — TROPONIN I

## 2015-10-12 MED ORDER — IPRATROPIUM-ALBUTEROL 0.5-2.5 (3) MG/3ML IN SOLN
3.0000 mL | Freq: Once | RESPIRATORY_TRACT | Status: AC
  Administered 2015-10-12: 3 mL via RESPIRATORY_TRACT
  Filled 2015-10-12: qty 3
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
## 2015-10-12 NOTE — ED Notes (Signed)
States her heart rate has been high for a few days - she checks it regularly. Hr 108 currently. States shaky as well. Uses oxygen and takes breathing treatments at home. No other complaints

## 2015-10-12 NOTE — Discharge Instructions (Signed)
Nonspecific Tachycardia Tachycardia is a faster than normal heartbeat (more than 100 beats per minute). In adults, the heart normally beats between 60 and 100 times a minute. A fast heartbeat may be a normal response to exercise or stress. It does not necessarily mean that something is wrong. However, sometimes when your heart beats too fast it may not be able to pump enough blood to the rest of your body. This can result in chest pain, shortness of breath, dizziness, and even fainting. Nonspecific tachycardia means that the specific cause or pattern of your tachycardia is unknown. CAUSES  Tachycardia may be harmless or it may be due to a more serious underlying cause. Possible causes of tachycardia include:  Exercise or exertion.  Fever.  Pain or injury.  Infection.  Loss of body fluids (dehydration).  Overactive thyroid.  Lack of red blood cells (anemia).  Anxiety and stress.  Alcohol.  Caffeine.  Tobacco products.  Diet pills.  Illegal drugs.  Heart disease. SYMPTOMS  Rapid or irregular heartbeat (palpitations).  Suddenly feeling your heart beating (cardiac awareness).  Dizziness.  Tiredness (fatigue).  Shortness of breath.  Chest pain.  Nausea.  Fainting. DIAGNOSIS  Your caregiver will perform a physical exam and take your medical history. In some cases, a heart specialist (cardiologist) may be consulted. Your caregiver may also order:  Blood tests.  Electrocardiography. This test records the electrical activity of your heart.  A heart monitoring test. TREATMENT  Treatment will depend on the likely cause of your tachycardia. The goal is to treat the underlying cause of your tachycardia. Treatment methods may include:  Replacement of fluids or blood through an intravenous (IV) tube for moderate to severe dehydration or anemia.  New medicines or changes in your current medicines.  Diet and lifestyle changes.  Treatment for certain  infections.  Stress relief or relaxation methods. HOME CARE INSTRUCTIONS   Rest.  Drink enough fluids to keep your urine clear or pale yellow.  Do not smoke.  Avoid:  Caffeine.  Tobacco.  Alcohol.  Chocolate.  Stimulants such as over-the-counter diet pills or pills that help you stay awake.  Situations that cause anxiety or stress.  Illegal drugs such as marijuana, phencyclidine (PCP), and cocaine.  Only take medicine as directed by your caregiver.  Keep all follow-up appointments as directed by your caregiver. SEEK IMMEDIATE MEDICAL CARE IF:   You have pain in your chest, upper arms, jaw, or neck.  You become weak, dizzy, or feel faint.  You have palpitations that will not go away.  You vomit, have diarrhea, or pass blood in your stool.  Your skin is cool, pale, and wet.  You have a fever that will not go away with rest, fluids, and medicine. MAKE SURE YOU:   Understand these instructions.  Will watch your condition.  Will get help right away if you are not doing well or get worse.   This information is not intended to replace advice given to you by your health care provider. Make sure you discuss any questions you have with your health care provider.   Document Released: 05/27/2004 Document Revised: 07/12/2011 Document Reviewed: 11/01/2014 Elsevier Interactive Patient Education 2016 Elsevier Inc.  

## 2015-10-15 ENCOUNTER — Telehealth: Payer: Self-pay | Admitting: *Deleted

## 2015-10-15 ENCOUNTER — Other Ambulatory Visit: Payer: Self-pay | Admitting: *Deleted

## 2015-10-15 MED ORDER — METOPROLOL SUCCINATE ER 50 MG PO TB24: 50.0000 mg | ORAL_TABLET | Freq: Every day | ORAL | Status: DC

## 2015-10-15 NOTE — Telephone Encounter (Signed)
Patient has a pulse rate of 119-120 sitting. She went to the Emergency Room on 10-12-15. She stated that they told her she was ok. Patient stated since she has stop taking metoprolol she has had a high heart rate. She has no transportation for a office visit today.  Pt Contact [phone]

## 2015-10-15 NOTE — Telephone Encounter (Signed)
Patient notified and will pick up  Medication.

## 2015-10-15 NOTE — Telephone Encounter (Signed)
Patient pulse is 118 sitting if patient stands increases to 135 and becomes short of breath,  Patient went to the ER on Sunday and they told her it was due to coming off the beta- blocker and sent patient home. Patient-+ feels as she is goint to faint when walking with HR at 135.

## 2015-10-15 NOTE — Telephone Encounter (Signed)
Resume metoprolol once  daily,   rx sent

## 2015-10-15 NOTE — Telephone Encounter (Signed)
Spoke with patient. She stopped her metoprolol on Thursday and went to the hospital on Sunday with tachycardia.  They evaluated her and everything was "okay".  She now has tachycardia with shortness of breath but no chest pain.  Patient has thrown her metoprolol away.  She can not come to the office today due to transportation concerns.  Please advise.

## 2015-10-21 ENCOUNTER — Telehealth: Payer: Self-pay | Admitting: Pulmonary Disease

## 2015-10-21 ENCOUNTER — Ambulatory Visit (INDEPENDENT_AMBULATORY_CARE_PROVIDER_SITE_OTHER): Payer: Managed Care, Other (non HMO) | Admitting: Family Medicine

## 2015-10-21 ENCOUNTER — Encounter: Payer: Self-pay | Admitting: Family Medicine

## 2015-10-21 ENCOUNTER — Ambulatory Visit: Payer: Managed Care, Other (non HMO) | Admitting: Family Medicine

## 2015-10-21 VITALS — BP 136/84 | HR 100 | Temp 98.6°F | Ht 66.0 in | Wt 162.1 lb

## 2015-10-21 DIAGNOSIS — J441 Chronic obstructive pulmonary disease with (acute) exacerbation: Secondary | ICD-10-CM | POA: Diagnosis not present

## 2015-10-21 MED ORDER — CEFDINIR 300 MG PO CAPS: 300.0000 mg | ORAL_CAPSULE | Freq: Two times a day (BID) | ORAL | Status: DC

## 2015-10-21 MED ORDER — VENTOLIN HFA 108 (90 BASE) MCG/ACT IN AERS: 1.0000 | INHALATION_SPRAY | RESPIRATORY_TRACT | Status: DC | PRN

## 2015-10-21 MED ORDER — PREDNISONE 10 MG PO TABS: ORAL_TABLET | ORAL | Status: DC

## 2015-10-21 NOTE — Progress Notes (Signed)
Subjective:  Patient ID: Diane [name], female    DOB: 1956/03/18  Age: 60 y.o. MRN: [id number]  CC: Congestion, SOB  HPI:  60 year old female with severe COPD and chronic respiratory failure presents with the above complaints.  Patient reports that she developed symptoms on Wednesday. Symptoms worsen over the weekend. She is currently having significant shortness of breath particularly with exertion. She also has congestion and cough. She's been taking her nebulizers and inhalers without improvement. No associated fevers or chills. No other complaints at this time.  Of note, she's been treated for COPD exacerbation twice in the past month.  Social Hx   Social History   Social History  . Marital Status: Married    Spouse Name: craig  . Number of Children: N/A  . Years of Education: N/A   Occupational History  .     Social History Main Topics  . Smoking status: Former Smoker -- 40 years    Types: Cigarettes    Quit date: 11/19/2013  . Smokeless tobacco: Never Used  . Alcohol Use: No  . Drug Use: No  . Sexual Activity: Not Asked   Other Topics Concern  . None   Social History Narrative   Review of Systems  Constitutional: Negative for fever.  HENT: Positive for congestion.   Respiratory: Positive for cough and shortness of breath.     Objective:  BP 136/84 mmHg  Pulse 100  Temp(Src) 98.6 F (37 C) (Oral)  Ht 5\' 6"  (1.676 m)  Wt 162 lb 2 oz (73.539 kg)  BMI 26.18 kg/m2  SpO2 95%  BP/Weight 10/21/2015 10/12/2015 09/26/2015  Systolic BP 136 119 140  Diastolic BP 84 75 90  Wt. (Lbs) 162.13 162 166.8  BMI 26.18 26.16 26.94   Physical Exam  Constitutional: She is oriented to person, place, and time.  Chronically ill appearing, NAD.  Cardiovascular: Normal rate and regular rhythm.   Pulmonary/Chest: Effort normal.  Scattered wheezing.  Neurological: She is alert and oriented to person, place, and time.  Psychiatric: She has a normal mood and affect.  Vitals  reviewed.  Lab Results  Component Value Date   WBC 13.3* 10/12/2015   HGB 13.4 10/12/2015   HCT 39.7 10/12/2015   PLT 255 10/12/2015   GLUCOSE 85 10/12/2015   CHOL 364* 05/19/2015   TRIG 217.0* 05/19/2015   HDL 80.40 05/19/2015   LDLDIRECT 230.0 05/19/2015   LDLCALC 156* 01/10/2014   ALT 26 09/20/2015   AST 26 09/20/2015   NA 136 10/12/2015   K 3.6 10/12/2015   CL 99* 10/12/2015   CREATININE 0.81 10/12/2015   BUN 7 10/12/2015   CO2 28 10/12/2015   TSH 3.18 05/19/2015   INR 1.06 01/02/2015   HGBA1C 5.4 08/22/2014   Assessment & Plan:   Problem List Items Addressed This Visit    COPD exacerbation (HCC) - Primary    Patient with signs/symptoms of COPD exacerbation. This is the 3rd occurrence in a month. Treating with Prednisone taper (see orders) and Omnicef. Scheduled follow up with pulm.       Relevant Medications   predniSONE (DELTASONE) 10 MG tablet      Meds ordered this encounter  Medications  . cefdinir (OMNICEF) 300 MG capsule    Sig: Take 1 capsule (300 mg total) by mouth 2 (two) times daily.    Dispense:  14 capsule    Refill:  0  . predniSONE (DELTASONE) 10 MG tablet    Sig: 50  mg (5 tablets) daily x 3 days, then 40 mg (4 tablets) daily x 3 days, then 30 mg (3 tablets) daily x 3 days, then 20 mg (2 tablets) daily x 3 days, then 10 mg (1 tablet) daily x 3 days. Then resume 5 mg daily.    Dispense:  45 tablet    Refill:  0   Follow-up: PRN; Has followup scheduled.  Everlene Other DO [facility]

## 2015-10-21 NOTE — Assessment & Plan Note (Signed)
Patient with signs/symptoms of COPD exacerbation. This is the 3rd occurrence in a month. Treating with Prednisone taper (see orders) and Omnicef. Scheduled follow up with pulm.

## 2015-10-21 NOTE — Telephone Encounter (Signed)
Refill of rescue inhaler sent to [facility]. Left detailed message on voicemail advising patient that Rx has been sent. Nothing further needed.

## 2015-10-21 NOTE — Telephone Encounter (Signed)
Pt calling stating would like us to call in a refill for emergency inhaler  [facility] [address]

## 2015-10-21 NOTE — Patient Instructions (Addendum)
Take the prednisone as prescribed. Antibiotic is at the pharmacy.   You have an appointment with pulmonary at 9:00 on Tuesday.  If you can't go, follow up with [place].  Take care  Dr. Adriana Simasook

## 2015-10-22 ENCOUNTER — Ambulatory Visit (INDEPENDENT_AMBULATORY_CARE_PROVIDER_SITE_OTHER): Payer: Managed Care, Other (non HMO) | Admitting: Pulmonary Disease

## 2015-10-22 ENCOUNTER — Encounter: Payer: Self-pay | Admitting: Pulmonary Disease

## 2015-10-22 VITALS — BP 150/90 | HR 91 | Ht 66.0 in | Wt 163.0 lb

## 2015-10-22 DIAGNOSIS — J449 Chronic obstructive pulmonary disease, unspecified: Secondary | ICD-10-CM | POA: Diagnosis not present

## 2015-10-22 DIAGNOSIS — J441 Chronic obstructive pulmonary disease with (acute) exacerbation: Secondary | ICD-10-CM | POA: Diagnosis not present

## 2015-10-22 MED ORDER — ROFLUMILAST 500 MCG PO TABS: 500.0000 ug | ORAL_TABLET | Freq: Every day | ORAL | Status: DC

## 2015-10-22 NOTE — Progress Notes (Signed)
ACUTE PULMONARY OFFICE VISIT  ACUTE PROBLEM: Visit requested by her primary MD for recurrent AECOPD  CHRONIC PULMONARY PROBLEMS: Severe COPD  SUBJ: This acute office visit was requested by Dr Adriana Simasook after he saw her 10/21/15 with persistent signs and symptoms of COPD exacerbation. On May 26, Dr Darrick Huntsmanullo noted wheezing on exam and prescribed prednisone and stopped metoprolol. On June 11 she presented to the ED with tachycardia and metoprolol was resumed. On Jun 20 she was seen in follow up with Dr Adriana Simasook who noted persistent wheezing and prescribed prednisone and Omnicef. She presently reports NP cough, chest tightness and wheezing. Denies CP, fever, purulent sputum, hemoptysis, LE edema and calf tenderness   OBJ: Filed Vitals:   10/22/15 0822 10/22/15 0829  BP:  150/90  Pulse: 91 91  Height:  5\' 6"  (1.676 m)  Weight:  163 lb (73.936 kg)  SpO2: 91% 91%  2 lpm [place]  NAD @ rest HEENT: slightly cushingoid facies Markedly diminished BS, few scattered wheezes RRR s M NABS, soft No LE edema    IMPRESSION: Very severe COPD, emphysema with chronic hypoxemic respiratory failure Acute exacerbation of COPD - treated 06/20 by Dr Adriana Simasook Frequent COPD exacerbations  PLAN:   Complete prednisone and Omnicef as prescribed by Dr Adriana Simasook Continue prednisone @ 5 mg daily after current pred pulse by Dr Adriana Simasook Oliver Hum[place]inue Breo and Spiriva Continue PRN albuterol by nebulizer Continue supplemental O2 Begin Daliresp 500 mg daily ROV already scheduled for 11/26/15  I have reviewed this patient's medical problems, current medications and therapies and prior pulmonary office notes in evaluation and formulation of the above assessment and plan    Billy Fischeravid Markian Glockner, MD PCCM service Mobile [phone] Pager [phone] 10/23/2015

## 2015-10-22 NOTE — Patient Instructions (Signed)
Complete prednisone taper and course of antibiotics as prescribed by Dr Adriana Simasook Mliss FritzBegin Daliresp - prescription has been sent to your pharmacy We already have follow up scheduled for 11/26/15 @ 10:45 - I will see you then. Call sooner as needed

## 2015-10-23 ENCOUNTER — Encounter: Payer: Self-pay | Admitting: Pulmonary Disease

## 2015-10-28 ENCOUNTER — Ambulatory Visit: Payer: Managed Care, Other (non HMO) | Admitting: Internal Medicine

## 2015-11-03 ENCOUNTER — Telehealth: Payer: Self-pay | Admitting: Pulmonary Disease

## 2015-11-03 NOTE — Telephone Encounter (Signed)
Pt states she thinks Daliresp is making her nauseated, and she has "the shakes". States she has been having diarrhea off and on, but not sure if that is coming from this [facility]

## 2015-11-03 NOTE — Telephone Encounter (Signed)
Nausea started about week ago. And shakes at the end of last week. Been on Daliresp x 2 weeks. States she has had diarrhea but says she has been on an abx. Daliresp is the only thing different she has been taking. Please advise.

## 2015-11-03 NOTE — Telephone Encounter (Signed)
Hold Daliresp until symptoms resolve, then resume. If symptoms recur, stop Daliresp altogether  Billy Fischeravid Simonds, MD PCCM service Mobile [phone] Pager [phone] 11/03/2015

## 2015-11-03 NOTE — Telephone Encounter (Signed)
Pt informed and will call back if she has to stop all together.

## 2015-11-10 ENCOUNTER — Other Ambulatory Visit: Payer: Self-pay | Admitting: Internal Medicine

## 2015-11-17 ENCOUNTER — Telehealth: Payer: Self-pay | Admitting: Pulmonary Disease

## 2015-11-17 NOTE — Telephone Encounter (Signed)
Stop the Daliresp. Its potential benefit is small and is not worth these adverse effects  Theodoro Gristave

## 2015-11-17 NOTE — Telephone Encounter (Signed)
Please advise 

## 2015-11-17 NOTE — Telephone Encounter (Signed)
Pt informed of response per DS and told to give us a call if she needs anything. Nothing further needed.

## 2015-11-17 NOTE — Telephone Encounter (Signed)
Patient wants to check on response to earlier call.  Patient states she has sob nausea and diarrhea.    Please call asap.

## 2015-11-17 NOTE — Telephone Encounter (Signed)
Pt states she went off her Daliresp , and then started it back last week and she is having the same symptoms, states she is now SOB.

## 2015-11-18 ENCOUNTER — Telehealth: Payer: Self-pay | Admitting: Pulmonary Disease

## 2015-11-18 NOTE — Telephone Encounter (Signed)
Spoke with pt and scheduled an appt for tomorrow morning due to COPD hx. Pt informed per VM to continue meds as she is doing until seen. Also informed pt if she gets into any distress to call 911 or go to ER. Pt verbalized understanding. Nothing further needed.

## 2015-11-18 NOTE — Telephone Encounter (Signed)
Pt states she is still having SOB and wants to know if her Prednisone should be increased. Please call.

## 2015-11-19 ENCOUNTER — Encounter: Payer: Self-pay | Admitting: Internal Medicine

## 2015-11-19 ENCOUNTER — Other Ambulatory Visit: Payer: Self-pay | Admitting: Internal Medicine

## 2015-11-19 ENCOUNTER — Ambulatory Visit (INDEPENDENT_AMBULATORY_CARE_PROVIDER_SITE_OTHER): Payer: Managed Care, Other (non HMO) | Admitting: Internal Medicine

## 2015-11-19 VITALS — BP 134/70 | HR 108 | Ht 67.0 in | Wt 165.0 lb

## 2015-11-19 DIAGNOSIS — J441 Chronic obstructive pulmonary disease with (acute) exacerbation: Secondary | ICD-10-CM

## 2015-11-19 MED ORDER — AZITHROMYCIN 250 MG PO TABS: ORAL_TABLET | ORAL | Status: AC

## 2015-11-19 MED ORDER — PREDNISONE 20 MG PO TABS
40.0000 mg | ORAL_TABLET | Freq: Every day | ORAL | Status: DC
Start: 2015-11-19 — End: 2015-12-17

## 2015-11-19 MED ORDER — IPRATROPIUM-ALBUTEROL 0.5-2.5 (3) MG/3ML IN SOLN
3.0000 mL | Freq: Once | RESPIRATORY_TRACT | Status: AC
  Administered 2015-11-19: 3 mL via RESPIRATORY_TRACT

## 2015-11-19 MED ORDER — ALBUTEROL SULFATE (2.5 MG/3ML) 0.083% IN NEBU: INHALATION_SOLUTION | RESPIRATORY_TRACT | Status: DC

## 2015-11-19 NOTE — Progress Notes (Signed)
ACUTE PULMONARY OFFICE VISIT  ACUTE PROBLEM: Visit requested by her primary MD for recurrent AECOPD  CHRONIC PULMONARY PROBLEMS: Severe COPD  SUBJ: Patient states that she is SOB, increased wheezing, increased WOB Sinus drainage noted per patient  Has been treated with multiple rounds of abx and steroids Does not tolerate daliresp due to abd symtpoms-diarhea/pain  Review of Systems:  Gen:  Denies  fever, sweats, +chills weigh loss   HEENT: Denies blurred vision, double vision, ear pain, eye pain, hearing loss, nose bleeds, sore throat  Cardiac:  No dizziness, chest pain or heaviness, chest tightness,edema  Resp:   + shortness of breath,+wheezing, -hemoptysis,    Other:  All other systems negative   OBJ: Filed Vitals:   11/19/15 0853  BP: 134/70  Pulse: 108  Height: 5\' 7"  (1.702 m)  Weight: 165 lb (74.844 kg)  SpO2: 92%  2 lpm [place]  NAD @ rest HEENT: slightly cushingoid facies + wheezes +rhonchi RRR s M NABS, soft No LE edema    IMPRESSION: Very severe COPD, emphysema with chronic hypoxemic respiratory failure Acute exacerbation of COPD - treated 06/20 by Dr Adriana Simasook, will also treat again for this visit 11/19/15 Frequent COPD exacerbations can not tolerate dalipresp  PLAN:   Patient given douneb treatment in office-feels much better Increase prednisone to 40 mg daily for 10 days z pack for abx sinusitis/acute bronchitis Continue Breo and Spiriva Continue PRN dounebs by nebulizer Continue supplemental O2 ROV already scheduled for 11/26/15 with Dr Sung AmabileSimonds   The Patient requires high complexity decision making for assessment and support, frequent evaluation and titration of therapies. Patient satisfied with Plan of action and management. All questions answered  Lucie LeatherKurian David Jamaurion Slemmer, M.D.  [facility]  Medical Director [facility] Medical Director [facility]

## 2015-11-19 NOTE — Patient Instructions (Signed)
Prednisone 40 mg daily x 10 days z pack  Follow up on July 26 with Dr Sung AmabileSimonds  Chronic Obstructive Pulmonary Disease Chronic obstructive pulmonary disease (COPD) is a common lung condition in which airflow from the lungs is limited. COPD is a general term that can be used to describe many different lung problems that limit airflow, including both chronic bronchitis and emphysema. If you have COPD, your lung function will probably never return to normal, but there are measures you can take to improve lung function and make yourself feel better. CAUSES   Smoking (common).  Exposure to secondhand smoke.  Genetic problems.  Chronic inflammatory lung diseases or recurrent infections. SYMPTOMS  Shortness of breath, especially with physical activity.  Deep, persistent (chronic) cough with a large amount of thick mucus.  Wheezing.  Rapid breaths (tachypnea).  Gray or bluish discoloration (cyanosis) of the skin, especially in your fingers, toes, or lips.  Fatigue.  Weight loss.  Frequent infections or episodes when breathing symptoms become much worse (exacerbations).  Chest tightness. DIAGNOSIS Your health care provider will take a medical history and perform a physical examination to diagnose COPD. Additional tests for COPD may include:  Lung (pulmonary) function tests.  Chest X-ray.  CT scan.  Blood tests. TREATMENT  Treatment for COPD may include:  Inhaler and nebulizer medicines. These help manage the symptoms of COPD and make your breathing more comfortable.  Supplemental oxygen. Supplemental oxygen is only helpful if you have a low oxygen level in your blood.  Exercise and physical activity. These are beneficial for nearly all people with COPD.  Lung surgery or transplant.  Nutrition therapy to gain weight, if you are underweight.  Pulmonary rehabilitation. This may involve working with a team of health care providers and specialists, such as respiratory,  occupational, and physical therapists. HOME CARE INSTRUCTIONS  Take all medicines (inhaled or pills) as directed by your health care provider.  Avoid over-the-counter medicines or cough syrups that dry up your airway (such as antihistamines) and slow down the elimination of secretions unless instructed otherwise by your health care provider.  If you are a smoker, the most important thing that you can do is stop smoking. Continuing to smoke will cause further lung damage and breathing trouble. Ask your health care provider for help with quitting smoking. He or she can direct you to community resources or hospitals that provide support.  Avoid exposure to irritants such as smoke, chemicals, and fumes that aggravate your breathing.  Use oxygen therapy and pulmonary rehabilitation if directed by your health care provider. If you require home oxygen therapy, ask your health care provider whether you should purchase a pulse oximeter to measure your oxygen level at home.  Avoid contact with individuals who have a contagious illness.  Avoid extreme temperature and humidity changes.  Eat healthy foods. Eating smaller, more frequent meals and resting before meals may help you maintain your strength.  Stay active, but balance activity with periods of rest. Exercise and physical activity will help you maintain your ability to do things you want to do.  Preventing infection and hospitalization is very important when you have COPD. Make sure to receive all the vaccines your health care provider recommends, especially the pneumococcal and influenza vaccines. Ask your health care provider whether you need a pneumonia vaccine.  Learn and use relaxation techniques to manage stress.  Learn and use controlled breathing techniques as directed by your health care provider. Controlled breathing techniques include:  Pursed lip breathing.  Start by breathing in (inhaling) through your nose for 1 second. Then, purse  your lips as if you were going to whistle and breathe out (exhale) through the pursed lips for 2 seconds.  Diaphragmatic breathing. Start by putting one hand on your abdomen just above your waist. Inhale slowly through your nose. The hand on your abdomen should move out. Then purse your lips and exhale slowly. You should be able to feel the hand on your abdomen moving in as you exhale.  Learn and use controlled coughing to clear mucus from your lungs. Controlled coughing is a series of short, progressive coughs. The steps of controlled coughing are: 1. Lean your head slightly forward. 2. Breathe in deeply using diaphragmatic breathing. 3. Try to hold your breath for 3 seconds. 4. Keep your mouth slightly open while coughing twice. 5. Spit any mucus out into a tissue. 6. Rest and repeat the steps once or twice as needed. SEEK MEDICAL CARE IF:  You are coughing up more mucus than usual.  There is a change in the color or thickness of your mucus.  Your breathing is more labored than usual.  Your breathing is faster than usual. SEEK IMMEDIATE MEDICAL CARE IF:  You have shortness of breath while you are resting.  You have shortness of breath that prevents you from:  Being able to talk.  Performing your usual physical activities.  You have chest pain lasting longer than 5 minutes.  Your skin color is more cyanotic than usual.  You measure low oxygen saturations for longer than 5 minutes with a pulse oximeter. MAKE SURE YOU:  Understand these instructions.  Will watch your condition.  Will get help right away if you are not doing well or get worse.   This information is not intended to replace advice given to you by your health care provider. Make sure you discuss any questions you have with your health care provider.   Document Released: 01/27/2005 Document Revised: 05/10/2014 Document Reviewed: 12/14/2012 Elsevier Interactive Patient Education [employer].

## 2015-11-26 ENCOUNTER — Encounter: Payer: Self-pay | Admitting: Pulmonary Disease

## 2015-11-26 ENCOUNTER — Ambulatory Visit (INDEPENDENT_AMBULATORY_CARE_PROVIDER_SITE_OTHER): Payer: Managed Care, Other (non HMO) | Admitting: Pulmonary Disease

## 2015-11-26 VITALS — BP 140/80 | HR 85 | Ht 66.0 in | Wt 166.8 lb

## 2015-11-26 DIAGNOSIS — J439 Emphysema, unspecified: Secondary | ICD-10-CM | POA: Diagnosis not present

## 2015-11-26 DIAGNOSIS — J441 Chronic obstructive pulmonary disease with (acute) exacerbation: Secondary | ICD-10-CM | POA: Diagnosis not present

## 2015-11-26 MED ORDER — PREDNISONE 10 MG PO TABS: 10.0000 mg | ORAL_TABLET | Freq: Every day | ORAL | 0 refills | Status: DC

## 2015-11-26 MED ORDER — PREDNISONE 10 MG PO TABS: 10.0000 mg | ORAL_TABLET | Freq: Every day | ORAL | 5 refills | Status: DC

## 2015-11-26 MED ORDER — AZITHROMYCIN 250 MG PO TABS: 250.0000 mg | ORAL_TABLET | Freq: Every day | ORAL | 10 refills | Status: DC

## 2015-11-27 NOTE — Progress Notes (Signed)
PULMONARY OFFICE VISIT   CHRONIC PULMONARY PROBLEMS: Severe COPD, prednisone and oxygen dependent  SUBJ: Seen by me 06/21. See by Dr Belia Heman 07/19 with exacerbation of symptoms again. I tried to start Daliresp but she has been intolerant due to nausea. Dr Belia Heman increased prednisone to 40 mg daily for 10 days and azithromycin. She is better now with clearing of chest congestion. Continues to have exertional dyspnea which is severe. Continues top have chest tightness. Denies fever, purulent sputum, hemoptysis, LE edema and calf tenderness.   OBJ: Vitals:   11/26/15 1040  BP: 140/80  Pulse: 85  SpO2: 97%  Weight: 166 lb 12.8 oz (75.7 kg)  Height: 5\' 6"  (1.676 m)  2 lpm [place]  NAD @ rest HEENT: slightly cushingoid facies Markedly diminished BS, few scattered wheezes RRR s M NABS, soft No LE edema    IMPRESSION: Very severe COPD, emphysema with chronic hypoxemic respiratory failure. Unable to taper off prednisone   PLAN:   - Continue Breo and Spiriva - Continue PRN albuterol by nebulizer - Continue supplemental O2 - Complete prednisone per Dr Clovis Fredrickson prescription then 10 mg daily - we will need a few weeks of quiescence before trying to taper further - Azithromycin 250 mg daily, indefinitely - ROV 4-6 weeks  I have reviewed this patient's medical problems, current medications and therapies and prior pulmonary office notes in evaluation and formulation of the above assessment and plan   Billy Fischer, MD PCCM service Mobile [phone] Pager [phone] 11/27/2015

## 2015-12-17 ENCOUNTER — Other Ambulatory Visit: Payer: Self-pay | Admitting: Pulmonary Disease

## 2015-12-17 ENCOUNTER — Telehealth: Payer: Self-pay | Admitting: Pulmonary Disease

## 2015-12-17 ENCOUNTER — Other Ambulatory Visit: Payer: Self-pay | Admitting: Internal Medicine

## 2015-12-17 DIAGNOSIS — F4323 Adjustment disorder with mixed anxiety and depressed mood: Secondary | ICD-10-CM

## 2015-12-17 MED ORDER — PREDNISONE 10 MG PO TABS: 10.0000 mg | ORAL_TABLET | Freq: Every day | ORAL | 5 refills | Status: DC

## 2015-12-17 NOTE — Telephone Encounter (Signed)
RX sent to pharmacy. Informed pt RX has been sent to [facility] [address]. Nothing further needed.

## 2015-12-17 NOTE — Telephone Encounter (Signed)
LMTCB x 1 

## 2015-12-17 NOTE — Telephone Encounter (Signed)
Pt calling stating when [facility] refilled prednisone  They did 20 pills with no refills. Not sure why they did that Is just calling us to please look into this. Please call patient.

## 2015-12-24 ENCOUNTER — Telehealth: Payer: Self-pay | Admitting: Pulmonary Disease

## 2015-12-24 NOTE — Telephone Encounter (Signed)
[employer] calling needing most recent OV notes. Please call. She leaves at 3:30, if after please fax [phone]

## 2015-12-24 NOTE — Telephone Encounter (Signed)
Last OV notes faxed. Nothing further needed.

## 2016-01-08 ENCOUNTER — Encounter: Payer: Self-pay | Admitting: Pulmonary Disease

## 2016-01-08 ENCOUNTER — Ambulatory Visit (INDEPENDENT_AMBULATORY_CARE_PROVIDER_SITE_OTHER): Payer: Managed Care, Other (non HMO) | Admitting: Pulmonary Disease

## 2016-01-08 ENCOUNTER — Ambulatory Visit
Admission: RE | Admit: 2016-01-08 | Discharge: 2016-01-08 | Disposition: A | Discharge status: Home / Self Care | Payer: Managed Care, Other (non HMO) | Source: Ambulatory Visit | Attending: Pulmonary Disease | Admitting: Pulmonary Disease

## 2016-01-08 VITALS — BP 132/88 | HR 87 | Ht 68.0 in | Wt 165.0 lb

## 2016-01-08 DIAGNOSIS — R0789 Other chest pain: Secondary | ICD-10-CM

## 2016-01-08 DIAGNOSIS — J449 Chronic obstructive pulmonary disease, unspecified: Secondary | ICD-10-CM | POA: Insufficient documentation

## 2016-01-08 DIAGNOSIS — J439 Emphysema, unspecified: Secondary | ICD-10-CM

## 2016-01-08 DIAGNOSIS — I700 Atherosclerosis of aorta: Secondary | ICD-10-CM | POA: Insufficient documentation

## 2016-01-08 MED ORDER — ARFORMOTEROL TARTRATE 15 MCG/2ML IN NEBU: 15.0000 ug | INHALATION_SOLUTION | Freq: Two times a day (BID) | RESPIRATORY_TRACT | 10 refills | Status: DC

## 2016-01-08 MED ORDER — BUDESONIDE 0.5 MG/2ML IN SUSP: 0.5000 mg | Freq: Two times a day (BID) | RESPIRATORY_TRACT | 10 refills | Status: DC

## 2016-01-08 NOTE — Patient Instructions (Signed)
Discontinue Breo Begin budesonide and formoterol in the nebulizer twice a day

## 2016-01-10 NOTE — Progress Notes (Signed)
PULMONARY OFFICE VISIT   CHRONIC PULMONARY PROBLEMS: Severe COPD, prednisone and oxygen dependent  SUBJ: Modestly improved since last visit but remains very limited by dyspnea and frustrated by weight gain. Denies CP, fever, purulent sputum, hemoptysis, LE edema and calf tenderness  OBJ: Vitals:   01/08/16 1435  BP: 132/88  Pulse: 87  SpO2: 93%  Weight: 165 lb (74.8 kg)  Height: 5\' 8"  (1.727 m)  2 lpm [name]  NAD @ rest HEENT: slightly cushingoid facies Markedly diminished BS, no wheezes RRR s M Moderately obese, NABS, soft No LE edema    IMPRESSION: Very severe COPD, emphysema with chronic hypoxemic respiratory failure,  Prednisone dependent  Weight gain due to prednisone   PLAN:   - Change Breo to nebulized budesonide and arformoterol BID - Continue tiotropium daily - Continue PRN albuterol by nebulizer - Continue supplemental O2 - Complete prednisone at 10 mg daily - we will need a few weeks of quiescence before trying to taper further - Continue Azithromycin 250 mg daily, indefinitely - ROV 6 weeks  I have reviewed this patient's medical problems, current medications and therapies and prior pulmonary office notes in evaluation and formulation of the above assessment and plan   Diane Fischeravid Simonds, MD PCCM service Mobile [phone] Pager [phone] 01/10/2016

## 2016-01-12 ENCOUNTER — Telehealth: Payer: Self-pay | Admitting: Pulmonary Disease

## 2016-01-12 NOTE — Telephone Encounter (Signed)
Pt states she had a chest x ray on Thursday, and would like results. Please call.

## 2016-01-12 NOTE — Telephone Encounter (Signed)
Please advise on CXR results from Thursday. Thanks

## 2016-01-13 ENCOUNTER — Other Ambulatory Visit: Payer: Self-pay | Admitting: Pulmonary Disease

## 2016-01-13 ENCOUNTER — Other Ambulatory Visit: Payer: Self-pay | Admitting: Internal Medicine

## 2016-01-13 NOTE — Telephone Encounter (Signed)
Chest Xray looked good.. No new findings

## 2016-01-13 NOTE — Telephone Encounter (Signed)
Pt informed. Nothing further needed. 

## 2016-01-14 NOTE — Telephone Encounter (Signed)
Rx faxed to pharmacy  

## 2016-01-14 NOTE — Telephone Encounter (Signed)
REFILL'S SENT  

## 2016-01-14 NOTE — Telephone Encounter (Signed)
Refill Lunesta 3 mg and Toprol XL 50 MG Last  Lunesta Refilled: 11/19/2015 1 refill 30 Tab Last Toprol XL Refilled: 10/15/15 90 tba Last OV 09/26/2015 Last labs 05/19/2015 Please advise

## 2016-01-21 ENCOUNTER — Other Ambulatory Visit: Payer: Self-pay | Admitting: Internal Medicine

## 2016-01-21 NOTE — Telephone Encounter (Signed)
Rx faxed to pharmacy  

## 2016-01-21 NOTE — Telephone Encounter (Signed)
refilled 

## 2016-01-21 NOTE — Telephone Encounter (Signed)
Last follow up OV 09/26/15... Last refill 11/19/15 #60 with 1 refill... Okay to refill?

## 2016-01-26 ENCOUNTER — Encounter: Payer: Self-pay | Admitting: Pulmonary Disease

## 2016-01-29 ENCOUNTER — Encounter: Payer: Self-pay | Admitting: *Deleted

## 2016-02-03 ENCOUNTER — Ambulatory Visit
Admission: RE | Admit: 2016-02-03 | Discharge: 2016-02-03 | Disposition: A | Discharge status: Home / Self Care | Payer: Managed Care, Other (non HMO) | Source: Ambulatory Visit | Attending: Ophthalmology | Admitting: Ophthalmology

## 2016-02-03 ENCOUNTER — Ambulatory Visit: Payer: Managed Care, Other (non HMO) | Admitting: Certified Registered Nurse Anesthetist

## 2016-02-03 ENCOUNTER — Encounter
Admission: RE | Disposition: A | Discharge status: Home / Self Care | Payer: Self-pay | Source: Ambulatory Visit | Attending: Ophthalmology

## 2016-02-03 ENCOUNTER — Encounter: Payer: Self-pay | Admitting: *Deleted

## 2016-02-03 DIAGNOSIS — K219 Gastro-esophageal reflux disease without esophagitis: Secondary | ICD-10-CM | POA: Diagnosis not present

## 2016-02-03 DIAGNOSIS — G4730 Sleep apnea, unspecified: Secondary | ICD-10-CM | POA: Diagnosis not present

## 2016-02-03 DIAGNOSIS — I10 Essential (primary) hypertension: Secondary | ICD-10-CM | POA: Insufficient documentation

## 2016-02-03 DIAGNOSIS — F419 Anxiety disorder, unspecified: Secondary | ICD-10-CM | POA: Insufficient documentation

## 2016-02-03 DIAGNOSIS — Z87891 Personal history of nicotine dependence: Secondary | ICD-10-CM | POA: Diagnosis not present

## 2016-02-03 DIAGNOSIS — Z79899 Other long term (current) drug therapy: Secondary | ICD-10-CM | POA: Insufficient documentation

## 2016-02-03 DIAGNOSIS — K449 Diaphragmatic hernia without obstruction or gangrene: Secondary | ICD-10-CM | POA: Insufficient documentation

## 2016-02-03 DIAGNOSIS — H2512 Age-related nuclear cataract, left eye: Secondary | ICD-10-CM | POA: Diagnosis present

## 2016-02-03 DIAGNOSIS — F329 Major depressive disorder, single episode, unspecified: Secondary | ICD-10-CM | POA: Insufficient documentation

## 2016-02-03 HISTORY — DX: Cough, unspecified: R05.9

## 2016-02-03 HISTORY — PX: CATARACT EXTRACTION W/PHACO: SHX586

## 2016-02-03 HISTORY — DX: Cough: R05

## 2016-02-03 SURGERY — PHACOEMULSIFICATION, CATARACT, WITH IOL INSERTION
Anesthesia: Monitor Anesthesia Care | Site: Eye | Laterality: Left | Wound class: Clean

## 2016-02-03 MED ORDER — POVIDONE-IODINE 5 % OP SOLN
OPHTHALMIC | Status: DC
Start: 2016-02-03 — End: 2016-02-03
  Filled 2016-02-03: qty 30

## 2016-02-03 MED ORDER — ARMC OPHTHALMIC DILATING DROPS
1.0000 "application " | OPHTHALMIC | Status: AC
  Administered 2016-02-03 (×3): 1 via OPHTHALMIC
  Filled 2016-02-03: qty 0.4

## 2016-02-03 MED ORDER — EPINEPHRINE HCL 1 MG/ML IJ SOLN
INTRAMUSCULAR | Status: AC
  Filled 2016-02-03: qty 2

## 2016-02-03 MED ORDER — CARBACHOL 0.01 % IO SOLN
INTRAOCULAR | Status: DC | PRN
  Administered 2016-02-03: 0.5 mL via INTRAOCULAR

## 2016-02-03 MED ORDER — MOXIFLOXACIN HCL 0.5 % OP SOLN
OPHTHALMIC | Status: DC | PRN
  Administered 2016-02-03: 1 [drp] via OPHTHALMIC

## 2016-02-03 MED ORDER — LIDOCAINE HCL (PF) 4 % IJ SOLN
INTRAOCULAR | Status: DC | PRN
  Administered 2016-02-03: 4 mL via OPHTHALMIC

## 2016-02-03 MED ORDER — LIDOCAINE HCL 3.5 % OP GEL
1.0000 "application " | Freq: Once | OPHTHALMIC | Status: AC
  Administered 2016-02-03: 1 via OPHTHALMIC

## 2016-02-03 MED ORDER — LIDOCAINE HCL (PF) 4 % IJ SOLN
INTRAMUSCULAR | Status: AC
  Filled 2016-02-03: qty 5

## 2016-02-03 MED ORDER — MIDAZOLAM HCL 2 MG/2ML IJ SOLN
INTRAMUSCULAR | Status: DC | PRN
  Administered 2016-02-03: 2 mg via INTRAVENOUS

## 2016-02-03 MED ORDER — MOXIFLOXACIN HCL 0.5 % OP SOLN
1.0000 [drp] | OPHTHALMIC | Status: AC
  Administered 2016-02-03 (×3): 1 [drp] via OPHTHALMIC

## 2016-02-03 MED ORDER — FENTANYL CITRATE (PF) 100 MCG/2ML IJ SOLN
INTRAMUSCULAR | Status: DC | PRN
  Administered 2016-02-03: 25 ug via INTRAVENOUS

## 2016-02-03 MED ORDER — NA CHONDROIT SULF-NA HYALURON 40-17 MG/ML IO SOLN
INTRAOCULAR | Status: DC | PRN
  Administered 2016-02-03: 1 mL via INTRAOCULAR

## 2016-02-03 MED ORDER — POVIDONE-IODINE 5 % OP SOLN
1.0000 "application " | Freq: Once | OPHTHALMIC | Status: AC
  Administered 2016-02-03: 1 via OPHTHALMIC

## 2016-02-03 MED ORDER — SODIUM CHLORIDE 0.9 % IV SOLN
INTRAVENOUS | Status: DC
  Administered 2016-02-03: 12:00:00 via INTRAVENOUS

## 2016-02-03 MED ORDER — LIDOCAINE HCL 3.5 % OP GEL
OPHTHALMIC | Status: AC
  Filled 2016-02-03: qty 1

## 2016-02-03 MED ORDER — TETRACAINE HCL 0.5 % OP SOLN
OPHTHALMIC | Status: DC
Start: 2016-02-03 — End: 2016-02-03
  Filled 2016-02-03: qty 2

## 2016-02-03 MED ORDER — MOXIFLOXACIN HCL 0.5 % OP SOLN
OPHTHALMIC | Status: AC
  Filled 2016-02-03: qty 3

## 2016-02-03 MED ORDER — CEFUROXIME OPHTHALMIC INJECTION 1 MG/0.1 ML
INJECTION | OPHTHALMIC | Status: AC
  Filled 2016-02-03: qty 0.1

## 2016-02-03 MED ORDER — TETRACAINE HCL 0.5 % OP SOLN
1.0000 [drp] | Freq: Once | OPHTHALMIC | Status: AC
  Administered 2016-02-03: 1 [drp] via OPHTHALMIC

## 2016-02-03 MED ORDER — EPINEPHRINE HCL 1 MG/ML IJ SOLN
INTRAOCULAR | Status: DC | PRN
  Administered 2016-02-03: 1 mL via OPHTHALMIC

## 2016-02-03 SURGICAL SUPPLY — 21 items
CANNULA ANT/CHMB 27GA (MISCELLANEOUS) ×2 IMPLANT
CUP MEDICINE 2OZ PLAST GRAD ST (MISCELLANEOUS) ×2 IMPLANT
GLOVE BIO SURGEON STRL SZ8 (GLOVE) ×2 IMPLANT
GLOVE BIOGEL M 6.5 STRL (GLOVE) ×2 IMPLANT
GLOVE SURG LX 8.0 MICRO (GLOVE) ×1
GLOVE SURG LX STRL 8.0 MICRO (GLOVE) ×1 IMPLANT
GOWN STRL REUS W/ TWL LRG LVL3 (GOWN DISPOSABLE) ×2 IMPLANT
GOWN STRL REUS W/TWL LRG LVL3 (GOWN DISPOSABLE) ×2
LENS IOL TECNIS ITEC 17.0 (Intraocular Lens) ×2 IMPLANT
PACK CATARACT (MISCELLANEOUS) ×2 IMPLANT
PACK CATARACT BRASINGTON LX (MISCELLANEOUS) ×2 IMPLANT
PACK EYE AFTER SURG (MISCELLANEOUS) ×2 IMPLANT
SOL BSS BAG (MISCELLANEOUS) ×2
SOL PREP PVP 2OZ (MISCELLANEOUS) ×2
SOLUTION BSS BAG (MISCELLANEOUS) ×1 IMPLANT
SOLUTION PREP PVP 2OZ (MISCELLANEOUS) ×1 IMPLANT
SYR 3ML LL SCALE MARK (SYRINGE) ×2 IMPLANT
SYR 5ML LL (SYRINGE) ×2 IMPLANT
SYR TB 1ML 27GX1/2 LL (SYRINGE) ×2 IMPLANT
WATER STERILE IRR 250ML POUR (IV SOLUTION) ×2 IMPLANT
WIPE NON LINTING 3.25X3.25 (MISCELLANEOUS) ×2 IMPLANT

## 2016-02-03 NOTE — Op Note (Signed)
PREOPERATIVE DIAGNOSIS:  Nuclear sclerotic cataract of the left eye.   POSTOPERATIVE DIAGNOSIS:  Nuclear sclerotic cataract of the left eye.   OPERATIVE PROCEDURE: Procedure(s): CATARACT EXTRACTION PHACO AND INTRAOCULAR LENS PLACEMENT (IOC)   SURGEON:  Galen ManilaWilliam Indria Bishara, MD.   ANESTHESIA:  Anesthesiologist: Lenard SimmerAndrew Karenz, MD CRNA: Malva Coganatherine Beane, CRNA; Marlana SalvageSandra Jessup, CRNA  1.      Managed anesthesia care. 2.      Topical tetracaine drops followed by 2% Xylocaine jelly applied in the preoperative holding area.   COMPLICATIONS:  None.   TECHNIQUE:   Stop and chop   DESCRIPTION OF PROCEDURE:  The patient was examined and consented in the preoperative holding area where the aforementioned topical anesthesia was applied to the left eye and then brought back to the Operating Room where the left eye was prepped and draped in the usual sterile ophthalmic fashion and a lid speculum was placed. A paracentesis was created with the side port blade and the anterior chamber was filled with viscoelastic. A near clear corneal incision was performed with the steel keratome. A continuous curvilinear capsulorrhexis was performed with a cystotome followed by the capsulorrhexis forceps. Hydrodissection and hydrodelineation were carried out with BSS on a blunt cannula. The lens was removed in a stop and chop  technique and the remaining cortical material was removed with the irrigation-aspiration handpiece. The capsular bag was inflated with viscoelastic and the Technis [id number] lens was placed in the capsular bag without complication. The remaining viscoelastic was removed from the eye with the irrigation-aspiration handpiece. The wounds were hydrated. The anterior chamber was flushed with Miostat and the eye was inflated to physiologic pressure. 0.2 mL of Vigamox diluted three/one with BSS was placed in the anterior chamber. The wounds were found to be water tight. The eye was dressed with Vigamox. The patient was  given protective glasses to wear throughout the day and a shield with which to sleep tonight. The patient was also given drops with which to begin a drop regimen today and will follow-up with me in one day.  Implant Name Type Inv. Item Serial No. Manufacturer Lot No. LRB No. Used  LENS IOL DIOP 17.0 - [id number] Intraocular Lens LENS IOL DIOP 17.0 [id number]   Left 1    Procedure(s) with comments: CATARACT EXTRACTION PHACO AND INTRAOCULAR LENS PLACEMENT (IOC) (Left) - US 00:39 AP% 10.9 CDE 4.35 Fluid pack lot # [id number]  Electronically signed: Mida Cory LOUIS 02/03/2016 12:28 PM

## 2016-02-03 NOTE — Anesthesia Procedure Notes (Signed)
Procedure Name: MAC Performed by: Samella Lucchetti Pre-anesthesia Checklist: Patient identified, Emergency Drugs available, Suction available, Timeout performed and Patient being monitored Oxygen Delivery Method: Nasal cannula       

## 2016-02-03 NOTE — H&P (Signed)
All labs reviewed. Abnormal studies sent to patients PCP when indicated.  Previous H&P reviewed, patient examined, there are NO CHANGES.  Diane [name] LOUIS10/3/201712:03 PM

## 2016-02-03 NOTE — Anesthesia Postprocedure Evaluation (Signed)
Anesthesia Post Note  Patient: Diane KeysPeggy A [name]  Procedure(s) Performed: Procedure(s) (LRB): CATARACT EXTRACTION PHACO AND INTRAOCULAR LENS PLACEMENT (IOC) (Left)  Patient location during evaluation: PACU Anesthesia Type: MAC Level of consciousness: awake and alert and oriented Pain management: satisfactory to patient Vital Signs Assessment: post-procedure vital signs reviewed and stable Respiratory status: respiratory function stable Cardiovascular status: stable Anesthetic complications: no    Last Vitals:  Vitals:   02/03/16 1106  BP: (!) 145/78  Pulse: 82  Resp: 16  Temp: 36.9 C    Last Pain:  Vitals:   02/03/16 1106  TempSrc: Oral                 Clydene PughBeane, Joshu Furukawa D

## 2016-02-03 NOTE — Anesthesia Preprocedure Evaluation (Signed)
Anesthesia Evaluation  Patient identified by MRN, date of birth, ID band Patient awake    Reviewed: Allergy & Precautions, H&P , NPO status , Patient's Chart, lab work & pertinent test results, reviewed documented beta blocker date and time   History of Anesthesia Complications Negative for: history of anesthetic complications  Airway Mallampati: III  TM Distance: >3 FB Neck ROM: full    Dental no notable dental hx. (+) Upper Dentures, Missing, Poor Dentition   Pulmonary shortness of breath and with exertion, asthma , sleep apnea , COPD,  COPD inhaler and oxygen dependent, neg recent URI, former smoker,           Cardiovascular Exercise Tolerance: Good hypertension, (-) angina+ Orthopnea  (-) CAD, (-) Past MI, (-) Cardiac Stents and (-) CABG (-) dysrhythmias + Valvular Problems/Murmurs      Neuro/Psych PSYCHIATRIC DISORDERS (Depression and anxiety) negative neurological ROS     GI/Hepatic Neg liver ROS, GERD  ,  Endo/Other  negative endocrine ROS  Renal/GU negative Renal ROS  negative genitourinary   Musculoskeletal   Abdominal   Peds  Hematology negative hematology ROS (+)   Anesthesia Other Findings Past Medical History: No date: Anxiety No date: Bronchitis No date: COPD (chronic obstructive pulmonary disease) (*     Comment: exacerbation 03/28/15. No date: Cough     Comment: chronic No date: Depression No date: Edema No date: Environmental allergies No date: GERD (gastroesophageal reflux disease) No date: Headache No date: Heart murmur No date: Hyperlipidemia No date: Hypertension No date: Lower extremity edema No date: Murmur No date: Occasional tremors     Comment: fropm Prednisone No date: Orthopnea No date: Orthopnea No date: Oxygen deficiency     Comment: 2l/ continuous No date: Pneumonia No date: Respiratory disorder     Comment: failure 01/25/15 No date: Sleep apnea No date: Tobacco  abuse No date: Tremors of nervous system     Comment: from prednisone No date: Tremors of nervous system No date: Wheezing No date: Wheezing   Reproductive/Obstetrics negative OB ROS                             Anesthesia Physical Anesthesia Plan  ASA: IV  Anesthesia Plan: MAC   Post-op Pain Management:    Induction:   Airway Management Planned:   Additional Equipment:   Intra-op Plan:   Post-operative Plan:   Informed Consent: I have reviewed the patients History and Physical, chart, labs and discussed the procedure including the risks, benefits and alternatives for the proposed anesthesia with the patient or authorized representative who has indicated his/her understanding and acceptance.   Dental Advisory Given  Plan Discussed with: Anesthesiologist, CRNA and Surgeon  Anesthesia Plan Comments:         Anesthesia Quick Evaluation

## 2016-02-03 NOTE — Discharge Instructions (Signed)
Eye Surgery Discharge Instructions  Expect mild scratchy sensation or mild soreness. DO NOT RUB YOUR EYE!  The day of surgery:  Minimal physical activity, but bed rest is not required  No reading, computer work, or close hand work  No bending, lifting, or straining.  May watch TV  For 24 hours:  No driving, legal decisions, or alcoholic beverages  Safety precautions  Eat anything you prefer: It is better to start with liquids, then soup then solid foods.  _____ Eye patch should be worn until postoperative exam tomorrow.  ____ Solar shield eyeglasses should be worn for comfort in the sunlight/patch while sleeping  Resume all regular medications including aspirin or Coumadin if these were discontinued prior to surgery. You may shower, bathe, shave, or wash your hair. Tylenol may be taken for mild discomfort.  Call your doctor if you experience significant pain, nausea, or vomiting, fever > 101 or other signs of infection. [phone] or [phone] Specific instructions:  Follow-up Information    Carlena BjornstadPORFILIO,WILLIAM LOUIS, MD .   Specialty:  Ophthalmology Why:  October 4 at 10:10am Contact information: [address] [place] [id number] [phone]

## 2016-02-03 NOTE — Transfer of Care (Signed)
Immediate Anesthesia Transfer of Care Note  Patient: Diane [name]  Procedure(s) Performed: Procedure(s) with comments: CATARACT EXTRACTION PHACO AND INTRAOCULAR LENS PLACEMENT (IOC) (Left) - US 00:39 AP% 10.9 CDE 4.35 Fluid pack lot # [id number]  Patient Location: PACU  Anesthesia Type:MAC  Level of Consciousness: awake, alert  and oriented  Airway & Oxygen Therapy: Patient Spontanous Breathing  Post-op Assessment: Report given to RN and Post -op Vital signs reviewed and stable  Post vital signs: Reviewed and stable  Last Vitals:  Vitals:   02/03/16 1106  BP: (!) 145/78  Pulse: 82  Resp: 16  Temp: 36.9 C    Last Pain:  Vitals:   02/03/16 1106  TempSrc: Oral         Complications: No apparent anesthesia complications

## 2016-02-16 ENCOUNTER — Ambulatory Visit (INDEPENDENT_AMBULATORY_CARE_PROVIDER_SITE_OTHER): Payer: Managed Care, Other (non HMO) | Admitting: Pulmonary Disease

## 2016-02-16 ENCOUNTER — Encounter: Payer: Self-pay | Admitting: Pulmonary Disease

## 2016-02-16 VITALS — BP 142/82 | HR 92 | Ht 66.0 in | Wt 169.2 lb

## 2016-02-16 DIAGNOSIS — J449 Chronic obstructive pulmonary disease, unspecified: Secondary | ICD-10-CM

## 2016-02-16 DIAGNOSIS — Z23 Encounter for immunization: Secondary | ICD-10-CM

## 2016-02-16 NOTE — Progress Notes (Signed)
PULMONARY OFFICE VISIT   CHRONIC PULMONARY PROBLEMS: Severe COPD, prednisone and oxygen dependent  SUBJ: Overall feels better and improved since last visit. Still with disabling dyspnea (her baseline). Feels that change to nebulized budesonide and arformoterol has been beneficial. Still uses Duoneb 2-3 times per day. Expresses concern re: continued weight gainDenies CP, fever, purulent sputum, hemoptysis, LE edema and calf tenderness.  OBJ: Vitals:   02/16/16 0840  BP: (!) 142/82  Pulse: 92  SpO2: 97%  Weight: 169 lb 3.2 oz (76.7 kg)  Height: 5\' 6"  (1.676 m)  2 lpm [place]  NAD @ rest HEENT: WNL Markedly diminished BS, no wheezes RRR s M Moderately obese, NABS, soft No LE edema    IMPRESSION: 1) Very severe COPD/emphysema with chronic hypoxemic respiratory failure,  Prednisone dependent 2) Weight gain due to prednisone   PLAN:   - Cont nebulized budesonide and arformoterol BID - Continue tiotropium daily - Continue PRN Duoneb - Continue supplemental O2 - Change prednisone at 10 mg alternating with 5 mg daily - Continue Azithromycin 250 mg daily, indefinitely - ROV 6 weeks. If she is still doing well, we will cut prednsione to 5 mg daily - We discussed weight loss. I emphasized reducing the amount of simple sugar in her diet. She drinks sweetened colas and tea. Recommended water - Flu vaccine administered today  I have reviewed this patient's medical problems, current medications and therapies and prior pulmonary office notes in evaluation and formulation of the above assessment and plan   Billy Fischeravid Dace Denn, MD PCCM service Mobile [phone] Pager [phone] 02/16/2016

## 2016-02-16 NOTE — Patient Instructions (Addendum)
1) decrease prednisone to 5 mg alternating with 10 mg daily 2) Work on cutting simple sugar out of your diet 3) Flu shot today 4) follow up in 6 weeks. If you are still doing well, we will cut prednisone down to 5 mg daily 5) We will look into pulmonary rehab program for you

## 2016-02-23 ENCOUNTER — Encounter: Payer: Self-pay | Admitting: Pulmonary Disease

## 2016-02-24 ENCOUNTER — Encounter: Payer: Self-pay | Admitting: Pulmonary Disease

## 2016-02-24 ENCOUNTER — Telehealth: Payer: Self-pay | Admitting: *Deleted

## 2016-02-24 MED ORDER — PREDNISONE 20 MG PO TABS: 40.0000 mg | ORAL_TABLET | Freq: Every day | ORAL | 0 refills | Status: DC

## 2016-02-24 NOTE — Telephone Encounter (Signed)
Spoke with pt and gave recs per DS as below. Nothing further needed and RX sent to pharmacy.   See if she can come in to be seen. If she chooses not to be seen in office, have her double prednisone to 40 mg daily for 5 days    Thanks    Theodoro Gristave  ----- Message -----  From: Renea EeMisty R Stanlee Roehrig, LPN  Sent: 16/10/960410/23/2017  1:10 PM  To: Merwyn Katosavid B Simonds, MD  Subject: FW: Non-Urgent Medical Question           Please advise on message below.    Thanks, Minda Faas  ----- Message -----  From: Hurshel KeysPeggy A [name]  Sent: 02/23/2016  9:18 AM  To: [facility]  Subject: Non-Urgent Medical Question             I have been having problems with my breathing this weekend. Been having to do a breathing treatment every 4 hours. Feel kinda tight in my chest. My oxygen has been at 95 to 97.

## 2016-02-25 ENCOUNTER — Encounter: Payer: Self-pay | Admitting: Internal Medicine

## 2016-03-15 ENCOUNTER — Other Ambulatory Visit: Payer: Self-pay | Admitting: Pulmonary Disease

## 2016-03-23 ENCOUNTER — Ambulatory Visit (INDEPENDENT_AMBULATORY_CARE_PROVIDER_SITE_OTHER): Payer: Managed Care, Other (non HMO) | Admitting: Pulmonary Disease

## 2016-03-23 ENCOUNTER — Encounter: Payer: Self-pay | Admitting: Pulmonary Disease

## 2016-03-23 VITALS — BP 138/82 | HR 98 | Wt 171.0 lb

## 2016-03-23 DIAGNOSIS — J439 Emphysema, unspecified: Secondary | ICD-10-CM

## 2016-03-23 DIAGNOSIS — J449 Chronic obstructive pulmonary disease, unspecified: Secondary | ICD-10-CM

## 2016-03-23 DIAGNOSIS — J441 Chronic obstructive pulmonary disease with (acute) exacerbation: Secondary | ICD-10-CM | POA: Diagnosis not present

## 2016-03-23 MED ORDER — DOXYCYCLINE HYCLATE 100 MG PO CAPS: 100.0000 mg | ORAL_CAPSULE | Freq: Two times a day (BID) | ORAL | 0 refills | Status: AC

## 2016-03-23 MED ORDER — PREDNISONE 20 MG PO TABS: 40.0000 mg | ORAL_TABLET | Freq: Every day | ORAL | 0 refills | Status: AC

## 2016-03-23 NOTE — Patient Instructions (Addendum)
Doxycycline 100 mg twice a day for 7 days Stop Azithromycin while on doxycycline Increase prednisone to 40 mg daily for 5 days - new prescription has been sent to pharmacy Call us Friday if not improving Keep appt scheduled for 11/27

## 2016-03-23 NOTE — Progress Notes (Signed)
PULMONARY OFFICE VISIT   CHRONIC PULMONARY PROBLEMS: Severe COPD, prednisone and oxygen dependent  SUBJ: Acute visit for 4 days of increased dyspnea, cough productive of scant green mucus, chest tightness. Denies CP, fever, hemoptysis increased LE edema  OBJ: Vitals:   03/23/16 0825  BP: 138/82  Pulse: 98  SpO2: 93%  Weight: 171 lb (77.6 kg)  2 lpm [place]  NAD @ rest HEENT: WNL Markedly diminished BS, diffuse scattered wheezes RRR s M Moderately obese, NABS, soft No LE edema    IMPRESSION: 1) Very severe COPD/emphysema with chronic hypoxemic respiratory failure,  Prednisone dependent 2) AECOPD   PLAN:   Chronic therapies: - Cont nebulized budesonide and arformoterol BID - Continue tiotropium daily - Continue PRN Duoneb - Continue supplemental O2 - Azithromycin 250 mg daily  New orders 03/23/16 Doxycycline 100 mg twice a day for 7 days Hold Azithromycin while on doxycycline Increase prednisone to 40 mg daily for 5 days - new prescription sent to pharmacy  Then resume 10 mg daily Call us Friday 03/25/16 if not improving Keep appt scheduled for 11/27 - I intend to discuss goals of care and options. She would be a good candidate for outpt palliative care services  I have reviewed this patient's medical problems, current medications and therapies and prior pulmonary office notes in evaluation and formulation of the above assessment and plan   Billy Fischeravid Simonds, MD PCCM service Mobile [phone] Pager [phone] 03/23/2016

## 2016-03-29 ENCOUNTER — Ambulatory Visit: Payer: Managed Care, Other (non HMO) | Admitting: Pulmonary Disease

## 2016-03-29 ENCOUNTER — Encounter: Payer: Self-pay | Admitting: Pulmonary Disease

## 2016-03-29 ENCOUNTER — Ambulatory Visit (INDEPENDENT_AMBULATORY_CARE_PROVIDER_SITE_OTHER): Payer: Managed Care, Other (non HMO) | Admitting: Pulmonary Disease

## 2016-03-29 VITALS — BP 144/80 | HR 89 | Ht 66.0 in | Wt 171.8 lb

## 2016-03-29 DIAGNOSIS — Z7189 Other specified counseling: Secondary | ICD-10-CM

## 2016-03-29 DIAGNOSIS — J441 Chronic obstructive pulmonary disease with (acute) exacerbation: Secondary | ICD-10-CM | POA: Diagnosis not present

## 2016-03-29 DIAGNOSIS — J432 Centrilobular emphysema: Secondary | ICD-10-CM | POA: Diagnosis not present

## 2016-03-29 DIAGNOSIS — R0902 Hypoxemia: Secondary | ICD-10-CM

## 2016-03-29 MED ORDER — ALPRAZOLAM 0.5 MG PO TABS: 0.5000 mg | ORAL_TABLET | Freq: Three times a day (TID) | ORAL | 5 refills | Status: DC | PRN

## 2016-03-29 MED ORDER — FLUTTER DEVI: 0 refills | Status: AC

## 2016-03-29 MED ORDER — AMBULATORY NON FORMULARY MEDICATION: 0 refills | Status: DC

## 2016-03-29 NOTE — Progress Notes (Signed)
PULMONARY OFFICE VISIT   CHRONIC PULMONARY PROBLEMS: Severe COPD, prednisone and oxygen dependent  SUBJ: Seen last week for AECOPD. Treated with prednisone 40 mg daily X 5 days (then back to 10 mg daily) and Doxycycline X 7 days (tomorrow is last day) then resume azithromycin. She is approx 50% back to her baseline (which is severe limitation even @ her best). Still has cough with chest congestion - green in morning and clear to white by afternoon. Denies CP, fever, purulent sputum, hemoptysis, LE edema and calf tenderness. She notes increasing anxiety which worsens her SOB   OBJ: Vitals:   03/29/16 1508  BP: (!) 144/80  Pulse: 89  SpO2: 96%  Weight: 171 lb 12.8 oz (77.9 kg)  Height: 5\' 6"  (1.676 m)  2 lpm [place]  NAD @ rest HEENT: WNL Markedly diminished BS, few scattered wheezes RRR s M Moderately obese, NABS, soft No LE edema    IMPRESSION: 1) Very severe (end stage) COPD/emphysema, prednisone dependent 2) Chronic hypoxemic respiratory failure 3) AECOPD - slowly resolving 4) Severe anxiety  PLAN:   Cont chronic therapies: - Nebulized budesonide and arformoterol BID - Ttiotropium daily - Prednisone 10 mg daily - PRN Duoneb - Supplemental O2 @ 3 LPM [place] - Resume Azithromycin 250 mg daily after doxycycline completed   Complete Doxycycline Alprazolam 0.5 mg PO TID PRN anxiety and sleep (DC clonazepam and Lunesta) Flutter valve to facilitate mucus clearance Referral for outpt Palliative Care Services We discussed goals of care and end of life decision making. She is not ready to be full DNR/DNI but is clear that she would want intubation to be short term only... NO TRACH!!! Follow up in 4-6 weeks  I have reviewed this patient's medical problems, current medications and therapies and prior pulmonary office notes in evaluation and formulation of the above assessment and plan   Diane Fischeravid Janyiah Silveri, MD PCCM service Mobile [phone] Pager [phone] 03/29/2016

## 2016-03-29 NOTE — Patient Instructions (Addendum)
1) Xanax 1/2 tab to 1 tab up to every eight hours as needed for anxiety or sleep 2) Stop clonazepam and the other sleep medicine 3) we will make a referral to Palliative Care Services 4) Flutter valve - use as often as needed and at least twice a day 5) Follow up in 4-6 weeks 6) Have a H. J. HeinzMerry Christmas

## 2016-04-07 ENCOUNTER — Other Ambulatory Visit: Payer: Self-pay | Admitting: Pulmonary Disease

## 2016-04-30 ENCOUNTER — Encounter: Payer: Self-pay | Admitting: Pulmonary Disease

## 2016-04-30 ENCOUNTER — Telehealth: Payer: Self-pay

## 2016-04-30 MED ORDER — PREDNISONE 20 MG PO TABS: 40.0000 mg | ORAL_TABLET | Freq: Every day | ORAL | 0 refills | Status: DC

## 2016-04-30 MED ORDER — DOXYCYCLINE HYCLATE 100 MG PO TABS: 100.0000 mg | ORAL_TABLET | Freq: Two times a day (BID) | ORAL | 0 refills | Status: DC

## 2016-04-30 NOTE — Telephone Encounter (Signed)
I will close this email, as a phone note as been open.

## 2016-04-30 NOTE — Telephone Encounter (Signed)
  Ideally, since she has poor lung reserve and very severe COPD, if she has a flareup on a Friday afternoon, she needs to be checked out thoroughly. I would recommend her going to the emergency room and have chest x-ray and blood work. She also needs to be checked out for influenza. If she does not want to go to the emergency room, we can prescribe her levofloxacin, 750 mg a day, for 7 days. She should hold off on azithromycin while on levofloxacin. We can also prescribe her prednisone, 40 mg daily for the next 5 days then after that, she can go back on her usual dose of 10 mg a day.  Pollie MeyerJ. Angelo A de Dios, MD 04/30/2016, 5:24 PM [facility] Pager [phone] After 3 pm or if no answer, call [phone]

## 2016-04-30 NOTE — Telephone Encounter (Signed)
   Does it sound like the patient has influenza? If she is concerned about this, she should go to the emergency room and be checked out. Otherwise, you can prescribe her doxycycline 100 mg twice a day for 7 days. She should call the office next week if not better.  Diane MeyerJ. Angelo A de Dios, MD 04/30/2016, 5:13 PM [facility] Pager [phone] After 3 pm or if no answer, call [phone]

## 2016-04-30 NOTE — Telephone Encounter (Signed)
Received email from pt. I have called and spoke with pt who states she started felling bad yesterday. Pt c/o non prod cough, post nasal drip, wheezing, increased sob, chills & sweats X2d.  AD please advise. Thanks.

## 2016-04-30 NOTE — Telephone Encounter (Signed)
I spoke with the pt and she is aware of all of AD's recs  She states if she worsens will go to ED  She does not feel like she has the flu, but like she is just having a COPD flare  She states can not take Levaquin due to allergy, so AD states to just call in the Doxy and Pred  Nothing further needed

## 2016-05-07 ENCOUNTER — Encounter: Payer: Self-pay | Admitting: Pulmonary Disease

## 2016-05-07 ENCOUNTER — Ambulatory Visit (INDEPENDENT_AMBULATORY_CARE_PROVIDER_SITE_OTHER): Payer: Managed Care, Other (non HMO) | Admitting: Pulmonary Disease

## 2016-05-07 VITALS — BP 144/88 | HR 101 | Wt 175.0 lb

## 2016-05-07 DIAGNOSIS — J00 Acute nasopharyngitis [common cold]: Secondary | ICD-10-CM | POA: Diagnosis not present

## 2016-05-07 DIAGNOSIS — J441 Chronic obstructive pulmonary disease with (acute) exacerbation: Secondary | ICD-10-CM

## 2016-05-07 DIAGNOSIS — J449 Chronic obstructive pulmonary disease, unspecified: Secondary | ICD-10-CM | POA: Diagnosis not present

## 2016-05-07 MED ORDER — FLUTICASONE PROPIONATE 50 MCG/ACT NA SUSP: 2.0000 | Freq: Every day | NASAL | 10 refills | Status: AC

## 2016-05-07 NOTE — Patient Instructions (Addendum)
Add flonase nasal inhaler  Nasal lavages (washes)  Continue rest as previously prescribed  Follow up with me in 4 weeks

## 2016-05-15 NOTE — Progress Notes (Signed)
PULMONARY OFFICE VISIT   CHRONIC PULMONARY PROBLEMS: Severe COPD, prednisone and oxygen dependent  INTERVAL: Called covering MD 12/29 and was prescribed doxycycline and instructed to increased prednisone from 10 mg daily to 40 mg. She is now back on 10 mg daily and finishes antibiotics on the day of this evaluation  SUBJ: Continues to struggle. Has improved some since 12/29 telephone encounter but not 100% back to baseline. Complains of sinus pressure, nasal discharge, NP cough. Denies CP, fever, purulent sputum, hemoptysis, LE edema and calf tenderness.   OBJ: Vitals:   05/07/16 1159  BP: (!) 144/88  Pulse: (!) 101  SpO2: 93%  Weight: 175 lb (79.4 kg)  2 lpm [place]  NAD @ rest HEENT: severe rhinitis with copious nasal discharge and severe erythema Markedly diminished BS, very distant scattered wheezes RRR s M Moderately obese, NABS, soft No LE edema    IMPRESSION: 1) End stage COPD/emphysema, prednisone dependent 2) Chronic hypoxemic respiratory failure 3) AECOPD - slowly resolving 4) Severe rhinitis  PLAN:   Cont chronic therapies: - Nebulized budesonide and arformoterol BID - Ttiotropium daily - Prednisone 10 mg daily - PRN Duoneb - Supplemental O2 @ 3 LPM [place] - Azithromycin 250 mg daily after doxycycline completed - PRN alprazolam - Flutter valve to facilitate mucus clearance   Add Flonase nasal inhaler and nasal lavages  Follow up in 4 weeks  I have reviewed this patient's medical problems, current medications and therapies and prior pulmonary office notes in evaluation and formulation of the above assessment and plan   Billy Fischeravid Simonds, MD PCCM service Mobile [phone] Pager [phone] 05/15/2016

## 2016-06-11 ENCOUNTER — Ambulatory Visit (INDEPENDENT_AMBULATORY_CARE_PROVIDER_SITE_OTHER): Payer: Managed Care, Other (non HMO) | Admitting: Pulmonary Disease

## 2016-06-11 VITALS — BP 136/84 | HR 96 | Wt 178.0 lb

## 2016-06-11 DIAGNOSIS — R0902 Hypoxemia: Secondary | ICD-10-CM | POA: Diagnosis not present

## 2016-06-11 DIAGNOSIS — J9809 Other diseases of bronchus, not elsewhere classified: Secondary | ICD-10-CM

## 2016-06-11 DIAGNOSIS — T17500A Unspecified foreign body in bronchus causing asphyxiation, initial encounter: Secondary | ICD-10-CM

## 2016-06-11 DIAGNOSIS — J449 Chronic obstructive pulmonary disease, unspecified: Secondary | ICD-10-CM | POA: Diagnosis not present

## 2016-06-11 NOTE — Progress Notes (Addendum)
PULMONARY OFFICE VISIT  CHRONIC PULMONARY PROBLEMS: Severe COPD, prednisone and oxygen dependent  INTERVAL: No major events  SUBJ: 6 week follow up after treatment for AECOPD. SHe is still struggling with disabling DOE. She reports chest tightness and has chronic rattling cough but has difficulty mobilizing secretions. She is struggling with ADLs. Using albuterol nebulizer almost every 4 hrs. She has a flutter valve but reports that it is not effective in mobilizing secretions. Remains on prednisone 10 mg daily  OBJ: Vitals:   06/11/16 1215  BP: 136/84  Pulse: 96  SpO2: 94%  Weight: 178 lb (80.7 kg)  2 lpm [place]  NAD @ rest HEENT: Cushingoid facies Markedly diminished BS, no wheezes RRR s M Moderately obese, NABS, soft No LE edema  IMPRESSION: 1) End stage COPD/emphysema, prednisone dependent 2) Chronic hypoxemic respiratory failure 3) Mucus retention - basic mucus clearance techniques such as flutter valve have been ineffective. There is nobody who can perform manual chest percussion reliably. I have reviewed her CT chest from 09/2015. She has multiple airways with thickened bronchial walls and an area in the lingula that I am concerned represents early bronchiectasis. She has chronic daily mucus production that contributes to her risk of hospitalization and to her chronic respiratory impairment  PLAN:   Cont chronic therapies: - Nebulized budesonide and arformoterol BID - Ttiotropium daily - Prednisone 10 mg daily - PRN Duoneb - Supplemental O2 @ 2 LPM [place] - Azithromycin 250 mg daily after doxycycline completed - PRN alprazolam - Flutter valve as needed - Flonase nasal inhaler and nasal lavages  ADD: Chest percussion vest ordered   I have reviewed this patient's medical problems, current medications and therapies and prior pulmonary office notes in evaluation and formulation of the above assessment and plan   Billy Fischeravid Simonds, MD PCCM service Mobile  [phone] Pager [phone] 06/11/2016

## 2016-06-11 NOTE — Patient Instructions (Addendum)
Continue :  - Nebulized budesonide and arformoterol BID - Ttiotropium daily - Prednisone 10 mg daily - PRN albuterol as inhaler or nebulizer - Supplemental O2 @ 2 LPM [place] - Azithromycin 250 mg daily after doxycycline completed - PRN alprazolam (Xanax) - Flutter valve as needed  We will try to obtain a chest percussion vest to facilitate mucus clearance

## 2016-06-18 ENCOUNTER — Other Ambulatory Visit: Payer: Self-pay | Admitting: *Deleted

## 2016-06-18 NOTE — Progress Notes (Signed)
Error

## 2016-06-22 ENCOUNTER — Other Ambulatory Visit: Payer: Self-pay | Admitting: *Deleted

## 2016-06-22 ENCOUNTER — Encounter: Payer: Self-pay | Admitting: Pulmonary Disease

## 2016-06-22 DIAGNOSIS — J479 Bronchiectasis, uncomplicated: Secondary | ICD-10-CM

## 2016-06-23 ENCOUNTER — Telehealth: Payer: Self-pay | Admitting: Pulmonary Disease

## 2016-06-23 MED ORDER — PREDNISONE 10 MG (21) PO TBPK: ORAL_TABLET | ORAL | 0 refills | Status: DC

## 2016-06-23 NOTE — Telephone Encounter (Signed)
Pt states she is having more problems with her breathing, and mucus is green. Please call.

## 2016-06-23 NOTE — Telephone Encounter (Signed)
Pt informed. rx sent. Nothing further needed. 

## 2016-06-23 NOTE — Telephone Encounter (Signed)
DS pt with severe COPD. SOB worse than usual and prod cough with green mucus. Pt takes Prednisone 10 mg daily and takes Azithromycin 250 mg daily. Please advise.

## 2016-06-23 NOTE — Telephone Encounter (Signed)
Needs to be on the percussion vest as prescribed by DS, very few other options. If she would like we can call in a script for prednisone taper:  Prednisone 10 mg tabs x 21.  Take 6 tablets on day 1 Take 5 tablets on day 2 Take 4 tablets on day 3 Take 3 tablets on day 4 Take 2 tablets on day 5 Take 1 tablet on day 6 then continue previous dose of prednisone.

## 2016-06-24 ENCOUNTER — Other Ambulatory Visit: Payer: Self-pay | Admitting: Pulmonary Disease

## 2016-07-01 ENCOUNTER — Other Ambulatory Visit: Payer: Self-pay | Admitting: Internal Medicine

## 2016-07-01 DIAGNOSIS — F4323 Adjustment disorder with mixed anxiety and depressed mood: Secondary | ICD-10-CM

## 2016-07-01 NOTE — Telephone Encounter (Signed)
PT last OV with you was on 09/26/15, last refill on Zoloft was on 05/25/16. Ok to refill?

## 2016-07-01 NOTE — Telephone Encounter (Signed)
OK TO REFILL.  DR Harley AltoSIMONDS SEEING HER TOO

## 2016-07-07 ENCOUNTER — Telehealth: Payer: Self-pay | Admitting: Pulmonary Disease

## 2016-07-07 MED ORDER — ALBUTEROL SULFATE (2.5 MG/3ML) 0.083% IN NEBU: INHALATION_SOLUTION | RESPIRATORY_TRACT | 3 refills | Status: DC

## 2016-07-07 NOTE — Telephone Encounter (Signed)
°*  STAT* If patient is at the pharmacy, call can be transferred to refill team.   1. Which medications need to be refilled? (please list name of each medication and dose if known)  albuterol   2. Which pharmacy/location (including street and city if local pharmacy) is medication to be sent to? [facility] [address]   She only has one packet left.

## 2016-07-07 NOTE — Telephone Encounter (Signed)
RX sent. Nothing further needed. 

## 2016-07-15 ENCOUNTER — Other Ambulatory Visit: Payer: Self-pay | Admitting: Pulmonary Disease

## 2016-07-15 ENCOUNTER — Other Ambulatory Visit: Payer: Self-pay | Admitting: Internal Medicine

## 2016-07-15 NOTE — Telephone Encounter (Signed)
Refilled 01/14/2016 Last OV: 09/25/2016 Next OV: not scheduled Labs: 10/12/2015  Is it ok to refill?

## 2016-09-09 ENCOUNTER — Other Ambulatory Visit: Payer: Self-pay | Admitting: Pulmonary Disease

## 2016-09-09 ENCOUNTER — Encounter: Payer: Self-pay | Admitting: Pulmonary Disease

## 2016-09-09 MED ORDER — ALPRAZOLAM 0.5 MG PO TABS: 0.5000 mg | ORAL_TABLET | Freq: Three times a day (TID) | ORAL | 5 refills | Status: DC | PRN

## 2016-09-09 NOTE — Telephone Encounter (Signed)
[facility]

## 2016-09-10 ENCOUNTER — Other Ambulatory Visit: Payer: Self-pay | Admitting: Pulmonary Disease

## 2016-09-10 MED ORDER — TIOTROPIUM BROMIDE MONOHYDRATE 2.5 MCG/ACT IN AERS: INHALATION_SPRAY | RESPIRATORY_TRACT | 3 refills | Status: DC

## 2016-09-10 MED ORDER — PREDNISONE 10 MG PO TABS: 10.0000 mg | ORAL_TABLET | Freq: Every day | ORAL | 5 refills | Status: DC

## 2016-09-10 MED ORDER — VENTOLIN HFA 108 (90 BASE) MCG/ACT IN AERS: INHALATION_SPRAY | RESPIRATORY_TRACT | 3 refills | Status: DC

## 2016-09-17 ENCOUNTER — Encounter: Payer: Self-pay | Admitting: Pulmonary Disease

## 2016-09-17 ENCOUNTER — Ambulatory Visit (INDEPENDENT_AMBULATORY_CARE_PROVIDER_SITE_OTHER): Payer: Managed Care, Other (non HMO) | Admitting: Pulmonary Disease

## 2016-09-17 VITALS — BP 162/100 | HR 104 | Wt 185.0 lb

## 2016-09-17 DIAGNOSIS — F329 Major depressive disorder, single episode, unspecified: Secondary | ICD-10-CM

## 2016-09-17 DIAGNOSIS — J9611 Chronic respiratory failure with hypoxia: Secondary | ICD-10-CM

## 2016-09-17 DIAGNOSIS — F419 Anxiety disorder, unspecified: Secondary | ICD-10-CM | POA: Diagnosis not present

## 2016-09-17 DIAGNOSIS — I10 Essential (primary) hypertension: Secondary | ICD-10-CM | POA: Diagnosis not present

## 2016-09-17 DIAGNOSIS — F32A Depression, unspecified: Secondary | ICD-10-CM

## 2016-09-17 DIAGNOSIS — R4589 Other symptoms and signs involving emotional state: Secondary | ICD-10-CM | POA: Diagnosis not present

## 2016-09-17 DIAGNOSIS — J9612 Chronic respiratory failure with hypercapnia: Secondary | ICD-10-CM | POA: Diagnosis not present

## 2016-09-17 DIAGNOSIS — J449 Chronic obstructive pulmonary disease, unspecified: Secondary | ICD-10-CM

## 2016-09-17 IMAGING — CR DG CHEST 2V
1 series · 2 of 2 positions shown · non-contrast
Comparison: Chest x-ray of September 20, 2015 and CT scan of the chest of
the same day.

CLINICAL DATA: Shortness of breath, mid back pain with pleuritic
component. History of COPD. Former smoker.

EXAM:
CHEST  2 VIEW

[Series 1: dg chest 2 view · 0.14mm/px · 2 of 2 slices shown]
[im 1/2]
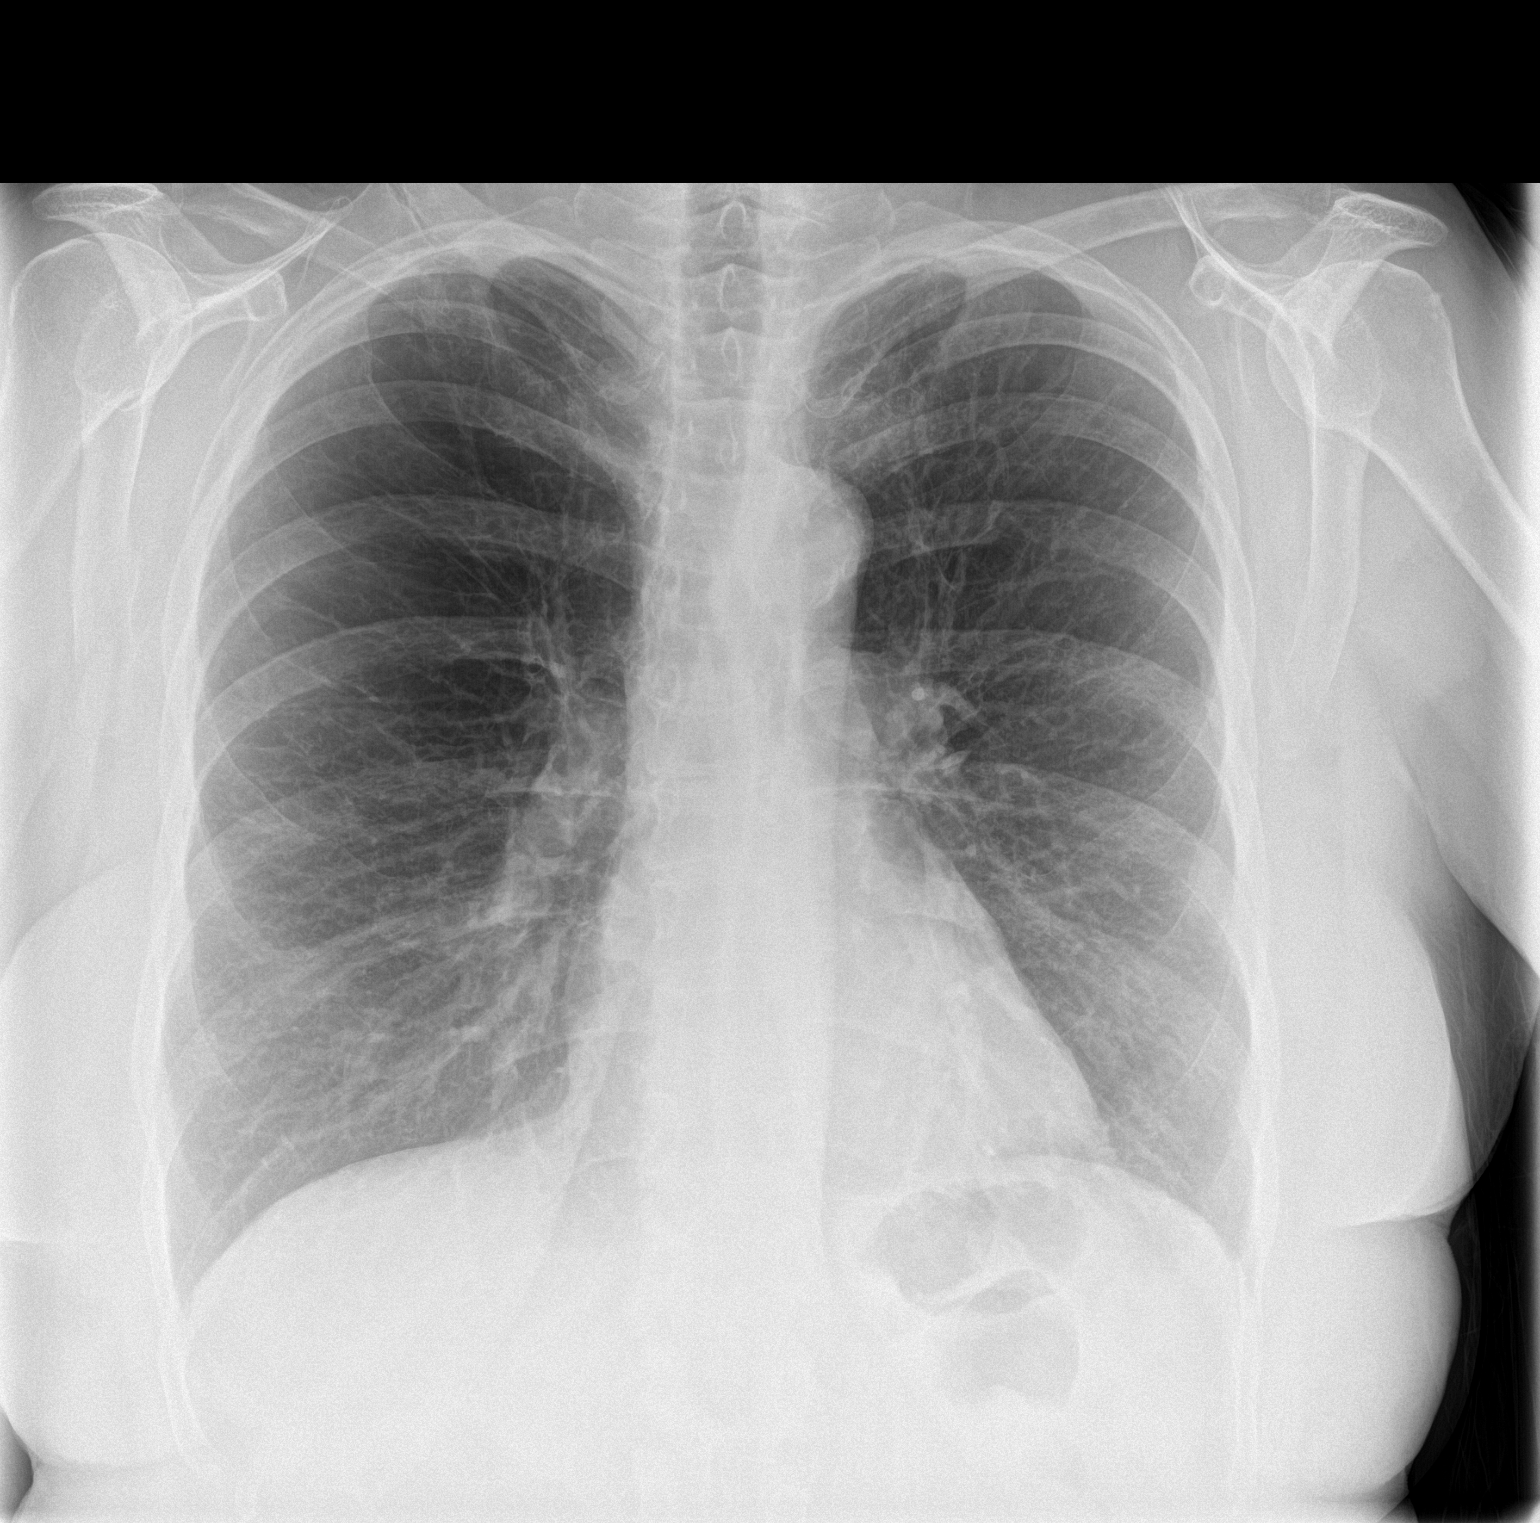
[im 2/2]
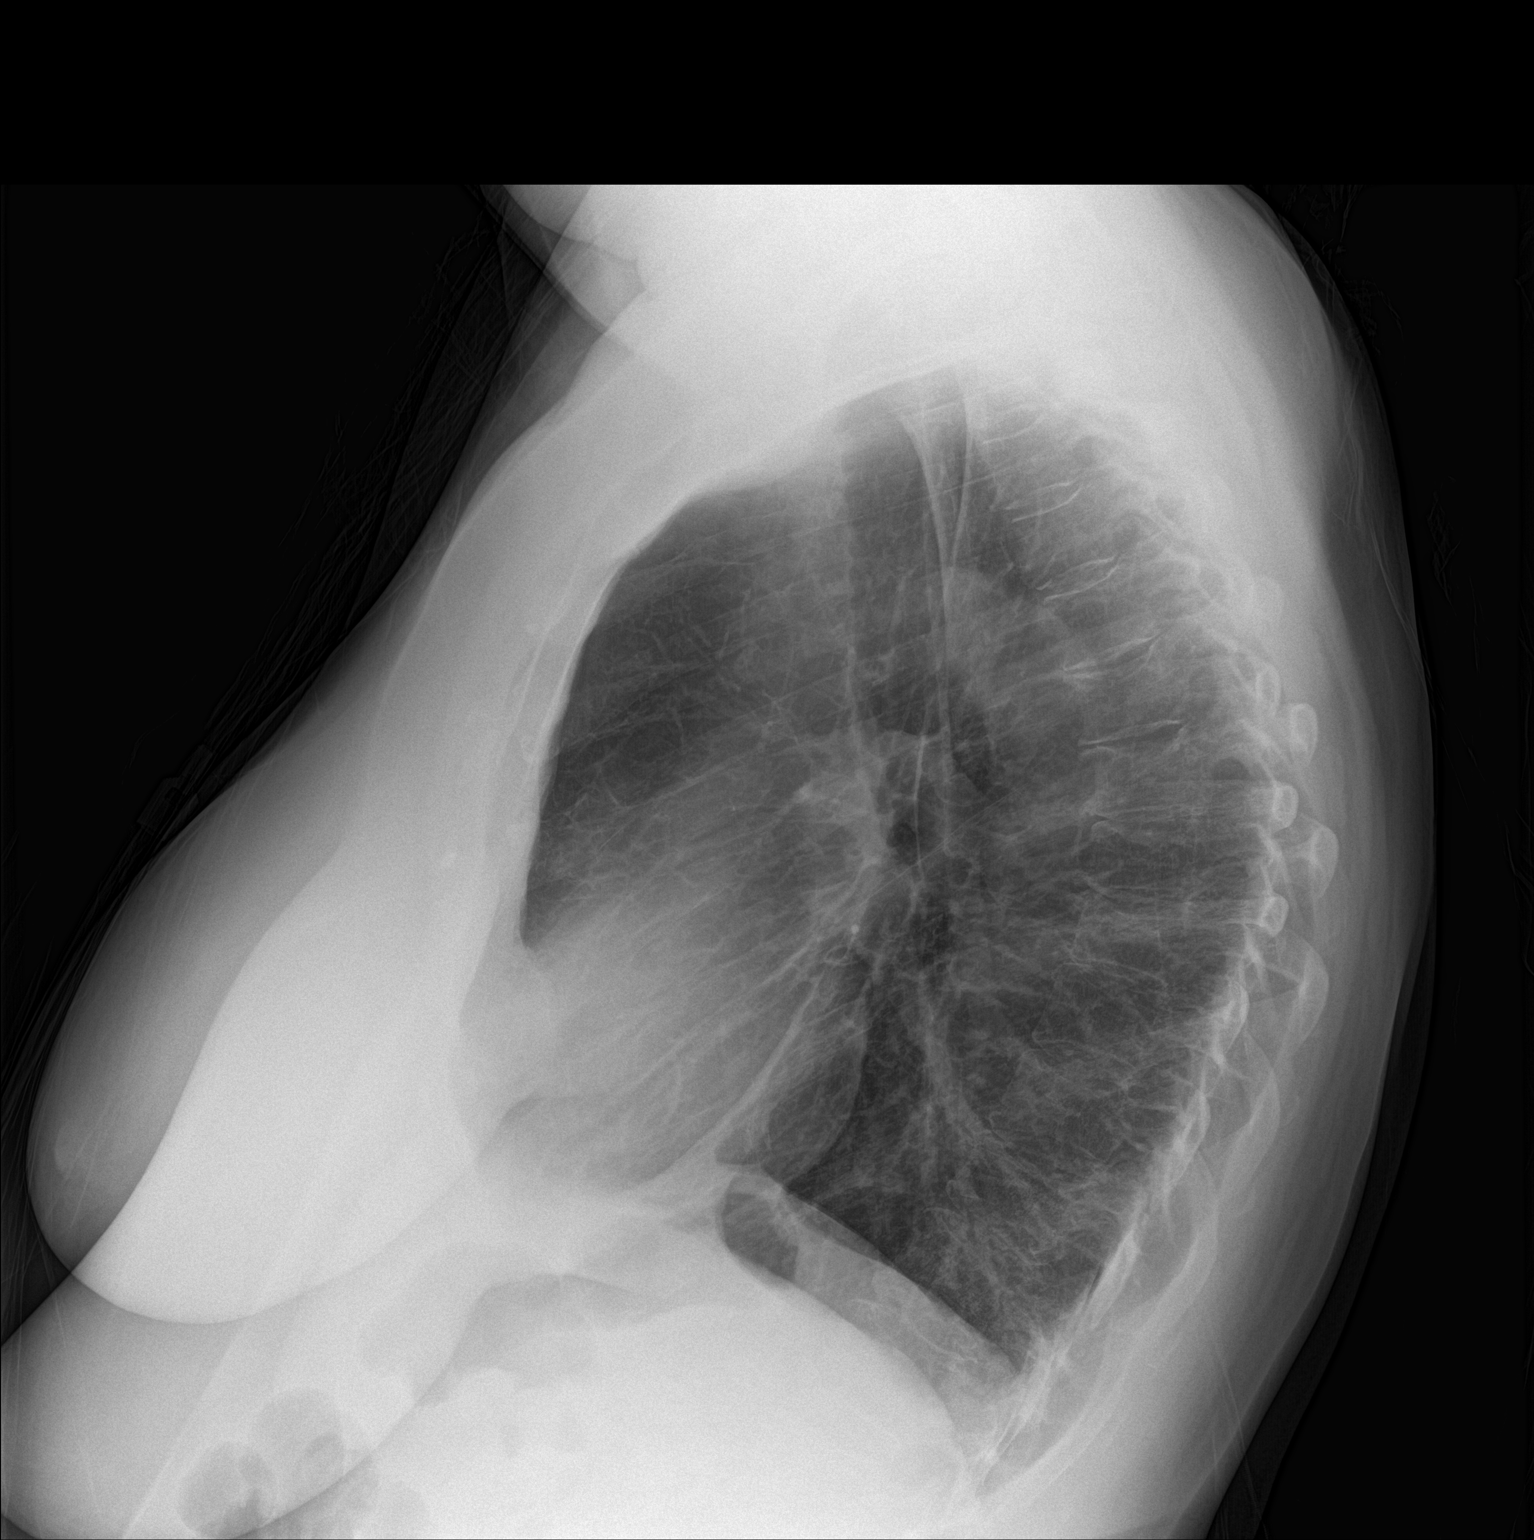

[2 of 2 positions shown; findings below may reference images not displayed]

FINDINGS: The lungs are hyperinflated with hemidiaphragm flattening. There is
no focal infiltrate. There is no pleural effusion or pneumothorax.
The heart and pulmonary vascularity are normal. The mediastinum is
normal in width. There is calcification in the wall of the aortic
arch. There is multilevel degenerative disc disease of the thoracic
spine. There is an old mid right clavicular shaft fracture which has
healed with deformity and possible nonunion.
IMPRESSION: COPD.  No pneumonia nor CHF.

Aortic atherosclerosis.

## 2016-09-17 NOTE — Patient Instructions (Addendum)
Continue nebulized budesonide, nebulized arformoterol, tiotropium inhaler, albuterol as needed, daily azithromycin, daily prednisone (10 mg daily) Continue chest percussion vest and oxygen therapy  We have made referral to behavioral therapy  Dr. Darrick Huntsmanullo is to address your hypertension  Follow up in 3 months

## 2016-09-20 NOTE — Progress Notes (Signed)
PULMONARY OFFICE VISIT  CHRONIC PULMONARY PROBLEMS: Severe COPD, prednisone and oxygen dependent  INTERVAL: No major events  SUBJ: 3 month follow up after initiating chest percussion last visit. She believes this has been a good addition and seems to benefit from it. However, she is still struggling with limiting DOE. She continues to struggle with ADLs and is now profoundly despondent over this. She is mostly house bound and feels little motivation to try to get out of the house due to the extreme effort required. She is tearful during this encounter. Denies CP, fever, purulent sputum, hemoptysis, LE edema and calf tenderness. Remains on prednisone 10 mg daily  OBJ: Vitals:   09/17/16 1152  BP: (!) 162/100  Pulse: (!) 104  SpO2: 95%  Weight: 185 lb (83.9 kg)  2 lpm [place]  NAD @ rest HEENT: Cushingoid facies Markedly diminished BS, no wheezes RRR s M Moderately obese, NABS, soft No LE edema  IMPRESSION: 1) End stage COPD/emphysema, prednisone dependent 2) Chronic hypoxemic respiratory failure 3) Mucus retention - improved after initiation of chest percussion vest 4) Chronic anxiety and extreme despondence over her profound limitation  We spent most of the encounter discussing strategies to make her existence more fulfilling. She does not have a wheelchair which limits her ability to get out of the house  PLAN:   Cont chronic therapies: - Nebulized budesonide and arformoterol BID - Ttiotropium daily - Prednisone 10 mg daily - PRN Duoneb - Supplemental O2 @ 2 LPM  - Azithromycin 250 mg daily after doxycycline completed - PRN alprazolam - Flutter valve as needed - Flonase nasal inhaler and nasal lavages - Chest percussion vest BID  We will work to obtain a wheelchair for her. I have strongly encouraged that she make the effort to get out more. She used to attend church regularly and no longer does. This, I suggested, would be a good place to start. She has Palliative Care  Services @ home. We will try to work with them to arrange for some counseling as well.   We discussed her elevated BP. I am not too concerned about it and will defer to Dr Darrick Huntsmanullo for further mgmt  I have reviewed this patient's medical problems, current medications and therapies and prior pulmonary office notes in evaluation and formulation of the above assessment and plan   Billy Fischeravid Auburn Hert, MD PCCM service Mobile [phone] Pager [phone] 09/20/2016 10:57 PM

## 2016-10-01 ENCOUNTER — Telehealth: Payer: Self-pay | Admitting: *Deleted

## 2016-10-01 NOTE — Telephone Encounter (Signed)
Irving Burtonmily with Palliative Care went and talked with pt about counseling and they have agreed that the SW will go on monthly basis and if needed pt will call her if she needs to talk sooner that that. She states pt doesn't want a behavioral health appt at this time. Irving Burtonmily states she did inform pt that she needs to contact her PCP for an adjustment on her anti depressant med. States pt's new insurance starts today so she is trying to make sure we can get pt a wheel chair and will call us when she finds out what she needs from us.    FYI

## 2016-10-04 ENCOUNTER — Other Ambulatory Visit: Payer: Self-pay | Admitting: Internal Medicine

## 2016-10-11 ENCOUNTER — Encounter: Payer: Self-pay | Admitting: Pulmonary Disease

## 2016-10-12 ENCOUNTER — Other Ambulatory Visit: Payer: Self-pay | Admitting: Internal Medicine

## 2016-10-12 DIAGNOSIS — Z79899 Other long term (current) drug therapy: Secondary | ICD-10-CM

## 2016-10-12 NOTE — Telephone Encounter (Signed)
Refilled: 09/24/2015 Last OV: 09/26/2015 Next OV: not scheduled Last Labs: 10/12/2015

## 2016-10-13 NOTE — Telephone Encounter (Signed)
No refills will be authorized without a CMET

## 2016-10-14 ENCOUNTER — Telehealth: Payer: Self-pay | Admitting: Pulmonary Disease

## 2016-10-14 NOTE — Telephone Encounter (Signed)
Patient says new insurance is going to charge a lot of money for her inhalers please call to discuss options .

## 2016-10-14 NOTE — Telephone Encounter (Signed)
Pt would like a call, states she has some information that we requested.

## 2016-10-14 NOTE — Telephone Encounter (Signed)
Pt states with her new insurance that her medications will be too expensive. Informed pt to call insurance to find out what is covered in place of her meds and give us a call back. Nothing further needed at this time.

## 2016-10-14 NOTE — Telephone Encounter (Signed)
Pt states she called [employer] and they state she has to inform the provider to go online to pull up the formulary. I informed her she should have a formulary book for meds. She states when the nurse comes tomorrow she will see if they can help her get one. States she can't afford the nebulizer meds. Please advise.

## 2016-10-14 NOTE — Telephone Encounter (Signed)
Spoke with pt and was able to schedule her a lab appt for next Monday to have her cmet checked. Pt is aware of appt date and time.

## 2016-10-15 ENCOUNTER — Telehealth: Payer: Self-pay | Admitting: Internal Medicine

## 2016-10-15 DIAGNOSIS — F4323 Adjustment disorder with mixed anxiety and depressed mood: Secondary | ICD-10-CM

## 2016-10-15 NOTE — Telephone Encounter (Signed)
Diane [name] is asking Dr. Darrick Huntsmantullo if she can up patients zoloft dosage to 150mg . Also prescribe clonazepam 0.5mg  for patient to add with taking Xanax PRN. Patient already is using Xanax.  Diane [name] stated that patient is having really bad anxiety due to her breathing issues. Please advise.

## 2016-10-15 NOTE — Telephone Encounter (Signed)
Rhett BannisterLeslie Herring from Palliative care called and wanted to discuss pt's medication. Please advise, thank you!  Call [place] @ [phone]

## 2016-10-15 NOTE — Telephone Encounter (Signed)
Can you handle this, thanks

## 2016-10-18 ENCOUNTER — Other Ambulatory Visit: Payer: Managed Care, Other (non HMO)

## 2016-10-18 MED ORDER — CLONAZEPAM 0.5 MG PO TABS: 0.5000 mg | ORAL_TABLET | Freq: Two times a day (BID) | ORAL | 2 refills | Status: DC | PRN

## 2016-10-18 MED ORDER — SERTRALINE HCL 100 MG PO TABS: 150.0000 mg | ORAL_TABLET | Freq: Every day | ORAL | 1 refills | Status: DC

## 2016-10-18 NOTE — Telephone Encounter (Signed)
Diane [name],  [facility]  He states that he was unaware that I was not working on Friday so this was not handled.  If you are going to delegate Palliative care/controlled substances refill,s,  Can you please make sure that the staff knows everything you know ?  I hate that this patient went 4 days without relief  I have now made the medication changes requested

## 2016-10-18 NOTE — Telephone Encounter (Signed)
Noted  

## 2016-10-27 ENCOUNTER — Telehealth: Payer: Self-pay | Admitting: Pulmonary Disease

## 2016-10-27 NOTE — Telephone Encounter (Signed)
Pt calling stating her insurance will only cover Simbicort  She is no longer covered for the Proventil   If we can please send to [facility] on [address].

## 2016-10-27 NOTE — Telephone Encounter (Signed)
Patient wants to know if Diane [name] can be changed to Symbicort. She can not afford the Brovana. Please advise.

## 2016-10-28 MED ORDER — BUDESONIDE-FORMOTEROL FUMARATE 160-4.5 MCG/ACT IN AERO: 2.0000 | INHALATION_SPRAY | Freq: Two times a day (BID) | RESPIRATORY_TRACT | 6 refills | Status: DC

## 2016-10-28 NOTE — Telephone Encounter (Signed)
I have discontinued orders for pulmicort and brovana and placed Rx for Symbicort  Diane [name]

## 2016-10-28 NOTE — Telephone Encounter (Signed)
Patient notified.[place] 

## 2016-11-04 ENCOUNTER — Other Ambulatory Visit: Payer: Self-pay | Admitting: Pulmonary Disease

## 2016-11-16 ENCOUNTER — Other Ambulatory Visit: Payer: Self-pay | Admitting: Pulmonary Disease

## 2016-11-17 ENCOUNTER — Encounter: Payer: Self-pay | Admitting: Internal Medicine

## 2016-11-17 ENCOUNTER — Ambulatory Visit (INDEPENDENT_AMBULATORY_CARE_PROVIDER_SITE_OTHER): Payer: BLUE CROSS/BLUE SHIELD | Admitting: Internal Medicine

## 2016-11-17 VITALS — BP 130/84 | HR 82 | Temp 98.1°F | Resp 17 | Ht 66.0 in | Wt 189.4 lb

## 2016-11-17 DIAGNOSIS — Z0000 Encounter for general adult medical examination without abnormal findings: Secondary | ICD-10-CM | POA: Diagnosis not present

## 2016-11-17 DIAGNOSIS — R7301 Impaired fasting glucose: Secondary | ICD-10-CM | POA: Diagnosis not present

## 2016-11-17 DIAGNOSIS — Z1231 Encounter for screening mammogram for malignant neoplasm of breast: Secondary | ICD-10-CM

## 2016-11-17 DIAGNOSIS — R5383 Other fatigue: Secondary | ICD-10-CM

## 2016-11-17 DIAGNOSIS — E7800 Pure hypercholesterolemia, unspecified: Secondary | ICD-10-CM

## 2016-11-17 DIAGNOSIS — Z23 Encounter for immunization: Secondary | ICD-10-CM | POA: Diagnosis not present

## 2016-11-17 DIAGNOSIS — I10 Essential (primary) hypertension: Secondary | ICD-10-CM

## 2016-11-17 DIAGNOSIS — F329 Major depressive disorder, single episode, unspecified: Secondary | ICD-10-CM

## 2016-11-17 DIAGNOSIS — F419 Anxiety disorder, unspecified: Secondary | ICD-10-CM | POA: Diagnosis not present

## 2016-11-17 DIAGNOSIS — Z1239 Encounter for other screening for malignant neoplasm of breast: Secondary | ICD-10-CM

## 2016-11-17 DIAGNOSIS — F32A Depression, unspecified: Secondary | ICD-10-CM

## 2016-11-17 LAB — COMPREHENSIVE METABOLIC PANEL
ALBUMIN: 4.3 g/dL (ref 3.5–5.2)
ALT: 20 U/L (ref 0–35)
AST: 18 U/L (ref 0–37)
Alkaline Phosphatase: 44 U/L (ref 39–117)
BILIRUBIN TOTAL: 0.4 mg/dL (ref 0.2–1.2)
BUN: 10 mg/dL (ref 6–23)
CALCIUM: 10.1 mg/dL (ref 8.4–10.5)
CHLORIDE: 97 meq/L (ref 96–112)
CO2: 30 mEq/L (ref 19–32)
CREATININE: 0.89 mg/dL (ref 0.40–1.20)
GFR: 68.77 mL/min (ref >60.00)
Glucose, Bld: 95 mg/dL (ref 70–99)
Potassium: 4.1 mEq/L (ref 3.5–5.1)
Sodium: 136 mEq/L (ref 135–145)
Total Protein: 6.7 g/dL (ref 6.0–8.3)

## 2016-11-17 LAB — LIPID PANEL
CHOLESTEROL: 243 mg/dL — AB (ref 0–200)
HDL: 92.2 mg/dL (ref >39.00)
LDL CALC: 117 mg/dL — AB (ref 0–99)
NonHDL: 150.43
TRIGLYCERIDES: 167 mg/dL — AB (ref 0.0–149.0)
Total CHOL/HDL Ratio: 3
VLDL: 33.4 mg/dL (ref 0.0–40.0)

## 2016-11-17 LAB — HEPATITIS C ANTIBODY: HCV AB: NEGATIVE

## 2016-11-17 LAB — POCT GLYCOSYLATED HEMOGLOBIN (HGB A1C): Hemoglobin A1C: 5.6

## 2016-11-17 MED ORDER — ALPRAZOLAM 0.5 MG PO TABS: 0.5000 mg | ORAL_TABLET | Freq: Two times a day (BID) | ORAL | 5 refills | Status: DC | PRN

## 2016-11-17 NOTE — Patient Instructions (Addendum)
Diane [name],  It was so god to see you!!!  I'm glad ou have been taken care of by Dr Sung AmabileSimonds and Dr Belia HemanKasa.  We decided to forego annual mammograms and Lipitor   About your blood pressure:   The new goals for optimal blood pressure management are 120/70.  Please check your blood pressure once daily at home for the next week or so and send me the readings so I can determine if you need an increase in your losartan

## 2016-11-17 NOTE — Progress Notes (Signed)
Patient ID: Diane [name], female    DOB: 1956-03-27  Age: 61 y.o. MRN: [id number]  The patient is here for annual preventive   examination and management of other chronic and acute problems.  last seen may 2017 .  Cataract surgery Oct 2017 AECOPD, O2 dependent,  managed by [facility],  Last seen in May . Using a chest percussion /vibrating vest  About to be a great grand mom Aug 31  Has accepted her eventual mortality .  Still anxious , managed with scheduled clonazepam and prn alprazolam to use when ADLs become too overwhelming .  Needs tdap Advised to forgo annual mammograms given life expectancy  Has stopped the lipitor. "not my worry"   Clonazepam 7 a and 3p  ,  Xanax pre bath (QOD),     The risk factors are reflected in the social history.  The roster of all physicians providing medical care to patient - is listed in the Snapshot section of the chart.  Activities of daily living:  The patient is  NOT 100% independent in all ADLs due to severe COPD with DOE: dressing, toileting, feeding as well as independent mobility  Home safety : The patient has smoke detectors in the home. They wear seatbelts.  There are no firearms at home. There is no violence in the home.   There is no risks for hepatitis, STDs or HIV. There is no   history of blood transfusion. They have no travel history to infectious disease endemic areas of the world.  The patient has seen their dentist in the last six month. They have seen their eye doctor in the last year. They admit to slight hearing difficulty with regard to whispered voices and some television programs.  They have deferred audiologic testing in the last year.  They do not  have excessive sun exposure. Discussed the need for sun protection: hats, long sleeves and use of sunscreen if there is significant sun exposure.   Diet: the importance of a healthy diet is discussed. They do have a healthy diet.  The benefits of regular aerobic exercise were  discussed. She cannot walk with DOE.   Depression screen: there are no signs or vegative symptoms of depression- irritability, change in appetite, anhedonia, sadness/tearfullness.  Cognitive assessment: the patient manages all their financial and personal affairs and is actively engaged. They could relate day,date,year and events; recalled 2/3 objects at 3 minutes; performed clock-face test normally.  The following portions of the patient's history were reviewed and updated as appropriate: allergies, current medications, past family history, past medical history,  past surgical history, past social history  and problem list.  Visual acuity was not assessed per patient preference since she has regular follow up with her ophthalmologist. Hearing and body mass index were assessed and reviewed.   During the course of the visit the patient was educated and counseled about appropriate screening and preventive services including : fall prevention , diabetes screening, nutrition counseling, colorectal cancer screening, and recommended immunizations.    CC: The primary encounter diagnosis was Impaired fasting glucose. Diagnoses of Screening breast examination, Pure hypercholesterolemia, Hypertension, unspecified type, Fatigue, unspecified type, Need for Tdap vaccination, Annual physical exam, Anxiety, and Depression, unspecified depression type were also pertinent to this visit.  History Arlana has a past medical history of Anxiety; Bronchitis; COPD (chronic obstructive pulmonary disease) (HCC); Cough; Depression; Edema; Environmental allergies; GERD (gastroesophageal reflux disease); Headache; Heart murmur; Hyperlipidemia; Hypertension; Lower extremity edema; Murmur; Occasional tremors; Orthopnea; Orthopnea; Oxygen deficiency;  Pneumonia; Respiratory disorder; Sleep apnea; Tobacco abuse; Tremors of nervous system; Tremors of nervous system; Wheezing; and Wheezing.   She has a past surgical history that  includes Tubal ligation; Fracture surgery (2007); Cataract extraction w/PHACO (Right, 03/25/2015); Eye surgery; and Cataract extraction w/PHACO (Left, 02/03/2016).   Her family history includes Hypertension in her father and mother; Stroke in her father.She reports that she quit smoking about 3 years ago. Her smoking use included Cigarettes. She quit after 40.00 years of use. She has never used smokeless tobacco. She reports that she does not drink alcohol or use drugs.  Outpatient Medications Prior to Visit  Medication Sig Dispense Refill  . albuterol (PROVENTIL) (2.5 MG/3ML) 0.083% nebulizer solution USE 1 VIAL IN NEBULIZER EVERY 4 HOURS AS NEEDED 120 mL 3  . azithromycin (ZITHROMAX) 250 MG tablet TAKE ONE TABLET BY MOUTH ONCE DAILY 90 tablet 3  . budesonide-formoterol (SYMBICORT) 160-4.5 MCG/ACT inhaler Inhale 2 puffs into the lungs 2 (two) times daily. 1 Inhaler 6  . Cholecalciferol (VITAMIN D-3 PO) Take 2,000 Units by mouth daily.    . clonazePAM (KLONOPIN) 0.5 MG tablet Take 1 tablet (0.5 mg total) by mouth 2 (two) times daily as needed. for anxiety 60 tablet 2  . fluticasone (FLONASE) 50 MCG/ACT nasal spray Place 2 sprays into both nostrils daily. 16 g 10  . Loratadine 10 MG CAPS Take 1 capsule by mouth daily. In am.    . losartan (COZAAR) 50 MG tablet TAKE ONE TABLET BY MOUTH ONCE DAILY 90 tablet 3  . Melatonin 5 MG TABS Take 5 mg by mouth at bedtime.    . metoprolol succinate (TOPROL-XL) 50 MG 24 hr tablet TAKE ONE TABLET BY MOUTH ONCE DAILY TAKE  WITH  OR  IMMEDIATELY  FOLLOWING  A  MEAL 90 tablet 1  . Multiple Vitamin (MULTIVITAMIN) capsule Take 1 capsule by mouth daily.    . predniSONE (DELTASONE) 10 MG tablet TAKE ONE TABLET BY MOUTH ONCE DAILY WITH BREAKFAST 30 tablet 5  . Respiratory Therapy Supplies (FLUTTER) DEVI Use as directed 1 each 0  . sertraline (ZOLOFT) 100 MG tablet Take 1.5 tablets (150 mg total) by mouth daily. 135 tablet 1  . Tiotropium Bromide Monohydrate (SPIRIVA  RESPIMAT) 2.5 MCG/ACT AERS INHALE ONE SPRAY(S) BY MOUTH ONCE DAILY 3 Inhaler 3  . VENTOLIN HFA 108 (90 Base) MCG/ACT inhaler INHALE 1 TO 2 PUFFS BY MOUTH EVERY 4 HOURS AS NEEDED FOR WHEEZING OR SHORTNESS OF BREATH 54 each 3  . ALPRAZolam (XANAX) 0.5 MG tablet Take 1 tablet (0.5 mg total) by mouth 3 (three) times daily as needed for sleep or anxiety. 60 tablet 5  . AMBULATORY NON FORMULARY MEDICATION Medication Name: incentive spirometer (Patient not taking: Reported on 11/17/2016) 1 each 0  . atorvastatin (LIPITOR) 20 MG tablet TAKE ONE TABLET BY MOUTH ONCE DAILY (Patient not taking: Reported on 11/17/2016) 90 tablet 3  . doxycycline (VIBRA-TABS) 100 MG tablet Take 1 tablet (100 mg total) by mouth 2 (two) times daily. (Patient not taking: Reported on 11/17/2016) 14 tablet 0  . famotidine (PEPCID) 20 MG tablet Take 20 mg by mouth 2 (two) times daily. Reported on 05/19/2015    . ipratropium-albuterol (DUONEB) 0.5-2.5 (3) MG/3ML SOLN Take 3 mLs by nebulization every 4 (four) hours as needed. (Patient not taking: Reported on 11/17/2016) 360 mL 4  . predniSONE (DELTASONE) 10 MG tablet Take 1 tablet (10 mg total) by mouth daily with breakfast. (Patient not taking: Reported on 11/17/2016) 30 tablet 5  .  predniSONE (STERAPRED UNI-PAK 21 TAB) 10 MG (21) TBPK tablet Take 6 tablets on day 1, 5, 4, 3, 2, 1 and then continue previous dose. (Patient not taking: Reported on 11/17/2016) 21 tablet 0   No facility-administered medications prior to visit.     Review of Systems   Patient denies headache, fevers, malaise, unintentional weight loss, skin rash, eye pain, sinus congestion and sinus pain, sore throat, dysphagia,  hemoptysis , chest pain, palpitations, orthopnea, edema, abdominal pain, nausea, melena, diarrhea, constipation, flank pain, dysuria, hematuria, urinary  Frequency, nocturia, numbness, tingling, seizures,  Focal weakness, Loss of consciousness,  Tremor, insomnia, depression, and suicidal ideation.       Objective:  BP 130/84 (BP Location: Left Arm, Patient Position: Sitting, Cuff Size: Normal)   Pulse 82   Temp 98.1 F (36.7 C) (Oral)   Resp 17   Ht 5\' 6"  (1.676 m)   Wt 189 lb 6.4 oz (85.9 kg)   SpO2 98%   BMI 30.57 kg/m   Physical Exam   General appearance: alert, cooperative and appears  Older than stated age Ears: normal TM's and external ear canals both ears Throat: lips, mucosa, and tongue normal; teeth and gums normal Neck: no adenopathy, no carotid bruit, supple, symmetrical, trachea midline and thyroid not enlarged, symmetric, no tenderness/mass/nodules Back: symmetric, no curvature. ROM normal. No CVA tenderness. Lungs: clear to auscultation bilaterally, fair air movement.  Heart: regular rate and rhythm, S1, S2 normal, no murmur, click, rub or gallop Abdomen: soft, non-tender; bowel sounds normal; no masses,  no organomegaly Pulses: 2+ and symmetric Skin: Skin color, texture, turgor normal. No rashes or lesions Lymph nodes: Cervical, supraclavicular, and axillary nodes normal.    Assessment & Plan:   Problem List Items Addressed This Visit    Annual physical exam    Annual comprehensive preventive exam was done as well as an evaluation and management of chronic conditions .  During the course of the visit the patient was educated and counseled about appropriate screening and preventive services including :  diabetes screening, lipid analysis with projected   , nutrition counseling and recommended immunizations. Screening for cervical, breast and colon cancer have been suspended due to her life expectancy being < 10 years.  Printed recommendations were given      Anxiety    Secondary to air hunger.  Continue clonazepam and alprazolam      Relevant Medications   ALPRAZolam (XANAX) 0.5 MG tablet   Depression    Improved outlook and attitude despite acceptance of her prognosis .  Continue sertraline 150 mg daily       Relevant Medications   ALPRAZolam  (XANAX) 0.5 MG tablet   Hyperlipidemia    Untreated by patient choice due to life expectancy      Relevant Orders   Lipid panel (Completed)   Hypertension    she reports compliance with medication regimen  but has an elevated reading today in office.  He has been asked to check his BP at work and  submit readings for evaluation. Renal function is normal today  Lab Results  Component Value Date   CREATININE 0.89 11/17/2016         Relevant Orders   Comprehensive metabolic panel (Completed)   Impaired fasting glucose - Primary    a1c is reassuring that she is not prediabetic.  Lab Results  Component Value Date   HGBA1C 5.6 11/17/2016         Relevant Orders   POCT HgB  A1C (Completed)    Other Visit Diagnoses    Screening breast examination       Fatigue, unspecified type       Relevant Orders   Hepatitis C antibody (Completed)   HIV antibody (Completed)   Need for Tdap vaccination       Relevant Orders   Tdap vaccine greater than or equal to 7yo IM (Completed)      I have discontinued Ms. Passey's famotidine, ipratropium-albuterol, atorvastatin, AMBULATORY NON FORMULARY MEDICATION, and doxycycline. I have also changed her ALPRAZolam. Additionally, I am having her maintain her Melatonin, Loratadine, multivitamin, Cholecalciferol (VITAMIN D-3 PO), FLUTTER, fluticasone, metoprolol succinate, predniSONE, VENTOLIN HFA, Tiotropium Bromide Monohydrate, losartan, clonazePAM, sertraline, budesonide-formoterol, azithromycin, and albuterol.  Meds ordered this encounter  Medications  . ALPRAZolam (XANAX) 0.5 MG tablet    Sig: Take 1 tablet (0.5 mg total) by mouth 2 (two) times daily as needed for anxiety or sleep.    Dispense:  60 tablet    Refill:  5    Medications Discontinued During This Encounter  Medication Reason  . AMBULATORY NON FORMULARY MEDICATION Patient has not taken in last 30 days  . atorvastatin (LIPITOR) 20 MG tablet Patient has not taken in last 30 days  .  doxycycline (VIBRA-TABS) 100 MG tablet Patient has not taken in last 30 days  . famotidine (PEPCID) 20 MG tablet Patient has not taken in last 30 days  . ipratropium-albuterol (DUONEB) 0.5-2.5 (3) MG/3ML SOLN Patient has not taken in last 30 days  . predniSONE (DELTASONE) 10 MG tablet Duplicate  . predniSONE (STERAPRED UNI-PAK 21 TAB) 10 MG (21) TBPK tablet Patient has not taken in last 30 days  . ALPRAZolam (XANAX) 0.5 MG tablet Reorder    Follow-up: No Follow-up on file.   Sherlene Shams, MD

## 2016-11-18 LAB — HIV ANTIBODY (ROUTINE TESTING W REFLEX): HIV: NONREACTIVE

## 2016-11-20 DIAGNOSIS — R7301 Impaired fasting glucose: Secondary | ICD-10-CM | POA: Insufficient documentation

## 2016-11-20 NOTE — Assessment & Plan Note (Signed)
Untreated by patient choice due to life expectancy

## 2016-11-20 NOTE — Assessment & Plan Note (Signed)
Annual comprehensive preventive exam was done as well as an evaluation and management of chronic conditions .  During the course of the visit the patient was educated and counseled about appropriate screening and preventive services including :  diabetes screening, lipid analysis with projected   , nutrition counseling and recommended immunizations. Screening for cervical, breast and colon cancer have been suspended due to her life expectancy being < 10 years.  Printed recommendations were given

## 2016-11-20 NOTE — Assessment & Plan Note (Signed)
a1c is reassuring that she is not prediabetic.  Lab Results  Component Value Date   HGBA1C 5.6 11/17/2016

## 2016-11-20 NOTE — Assessment & Plan Note (Signed)
Improved outlook and attitude despite acceptance of her prognosis .  Continue sertraline 150 mg daily

## 2016-11-20 NOTE — Assessment & Plan Note (Signed)
Secondary to air hunger.  Continue clonazepam and alprazolam

## 2016-11-20 NOTE — Assessment & Plan Note (Signed)
she reports compliance with medication regimen  but has an elevated reading today in office.  He has been asked to check his BP at work and  submit readings for evaluation. Renal function is normal today  Lab Results  Component Value Date   CREATININE 0.89 11/17/2016

## 2016-12-14 ENCOUNTER — Other Ambulatory Visit: Payer: Self-pay | Admitting: Pulmonary Disease

## 2016-12-15 ENCOUNTER — Telehealth: Payer: Self-pay | Admitting: *Deleted

## 2016-12-15 NOTE — Telephone Encounter (Signed)
Folder received from the mail from [employer] for DS to review and sign highlighted areas. Placed in DS' folder.

## 2016-12-17 ENCOUNTER — Encounter: Payer: Self-pay | Admitting: Pulmonary Disease

## 2016-12-17 ENCOUNTER — Ambulatory Visit (INDEPENDENT_AMBULATORY_CARE_PROVIDER_SITE_OTHER): Payer: BLUE CROSS/BLUE SHIELD | Admitting: Pulmonary Disease

## 2016-12-17 VITALS — BP 120/72 | HR 85 | Ht 66.0 in | Wt 191.0 lb

## 2016-12-17 DIAGNOSIS — Z7952 Long term (current) use of systemic steroids: Secondary | ICD-10-CM | POA: Diagnosis not present

## 2016-12-17 DIAGNOSIS — J449 Chronic obstructive pulmonary disease, unspecified: Secondary | ICD-10-CM

## 2016-12-17 MED ORDER — ALBUTEROL SULFATE (2.5 MG/3ML) 0.083% IN NEBU: INHALATION_SOLUTION | RESPIRATORY_TRACT | 3 refills | Status: DC

## 2016-12-17 NOTE — Patient Instructions (Signed)
Change prednisone to 10 mg alternating with 5 mg every other day  Continue the rest of your medications as previously  Follow-up in 3 months or sooner as needed

## 2016-12-17 NOTE — Telephone Encounter (Signed)
Papers filled out and placed in interoffice mail to go back to [employer]. Nothing further needed.

## 2016-12-23 NOTE — Progress Notes (Signed)
PULMONARY OFFICE VISIT  CHRONIC PULMONARY PROBLEMS: Severe COPD, prednisone and oxygen dependent  INTERVAL: No major events  SUBJ: Routine re-eval. Still with severe exertional dyspnea. Reports "spells" of increased dyspnea - especially with baths. Using albuterol neb approx every 4 hrs. Denies CP, fever, purulent sputum, hemoptysis, LE edema and calf tenderness. Remains on prednisone 10 mg daily  OBJ: Vitals:   12/17/16 1045 12/17/16 1052  BP:  120/72  Pulse:  85  SpO2:  94%  Weight: 191 lb (86.6 kg)   Height: 5\' 6"  (1.676 m)   2 lpm [place]  NAD @ rest  HEENT: Cushingoid facies Markedly diminished BS, no wheezes RRR s M Moderately obese, NABS, soft No LE edema  IMPRESSION: 1) End stage COPD/emphysema, prednisone dependent 2) Chronic hypoxemic respiratory failure 3) Mucus retention - improved after initiation of chest percussion vest 4) Chronic anxiety and extreme despondence over her profound limitation  PLAN:   Cont chronic therapies: - Symbicort BID - Ttiotropium daily - PRN Duoneb - Supplemental O2 @ 2 LPM [place] - Azithromycin 250 mg daily after doxycycline completed - PRN alprazolam - Flutter valve as needed - Flonase nasal inhaler and nasal lavages - Chest percussion vest BID   Change prednisone to 10 mg alternating with 5 mg daily   I have reviewed this patient's medical problems, current medications and therapies and prior pulmonary office notes in evaluation and formulation of the above assessment and plan   Billy Fischer, MD PCCM service Mobile [phone] Pager [phone] 12/23/2016 3:18 PM

## 2016-12-28 ENCOUNTER — Other Ambulatory Visit: Payer: Self-pay | Admitting: Pulmonary Disease

## 2017-01-17 ENCOUNTER — Other Ambulatory Visit: Payer: Self-pay | Admitting: Internal Medicine

## 2017-01-26 NOTE — Telephone Encounter (Signed)
See care note  

## 2017-02-02 ENCOUNTER — Other Ambulatory Visit: Payer: Self-pay | Admitting: Internal Medicine

## 2017-02-03 NOTE — Telephone Encounter (Signed)
Last OV 11/17/2016 Next OV 03/21/2017 Last refill 10/18/2016

## 2017-02-03 NOTE — Telephone Encounter (Signed)
Ok to refill  Printed... 

## 2017-02-04 ENCOUNTER — Encounter: Payer: Self-pay | Admitting: Internal Medicine

## 2017-02-04 ENCOUNTER — Other Ambulatory Visit: Payer: Self-pay | Admitting: Internal Medicine

## 2017-02-04 MED ORDER — CLONAZEPAM 0.5 MG PO TABS: 0.5000 mg | ORAL_TABLET | Freq: Two times a day (BID) | ORAL | 5 refills | Status: DC | PRN

## 2017-02-04 NOTE — Telephone Encounter (Signed)
rx has been printed, signed and faxed.  

## 2017-03-07 ENCOUNTER — Encounter: Payer: Self-pay | Admitting: Pulmonary Disease

## 2017-03-07 ENCOUNTER — Ambulatory Visit: Payer: BLUE CROSS/BLUE SHIELD | Admitting: Pulmonary Disease

## 2017-03-07 VITALS — BP 138/96 | HR 109 | Ht 66.0 in | Wt 190.0 lb

## 2017-03-07 DIAGNOSIS — J441 Chronic obstructive pulmonary disease with (acute) exacerbation: Secondary | ICD-10-CM

## 2017-03-07 DIAGNOSIS — J449 Chronic obstructive pulmonary disease, unspecified: Secondary | ICD-10-CM | POA: Diagnosis not present

## 2017-03-07 MED ORDER — DOXYCYCLINE HYCLATE 100 MG PO TABS: 100.0000 mg | ORAL_TABLET | Freq: Two times a day (BID) | ORAL | 0 refills | Status: DC

## 2017-03-07 MED ORDER — PREDNISONE 20 MG PO TABS: 40.0000 mg | ORAL_TABLET | Freq: Every day | ORAL | 0 refills | Status: AC

## 2017-03-07 NOTE — Progress Notes (Signed)
PULMONARY OFFICE VISIT  CHRONIC PULMONARY PROBLEMS: Severe COPD, prednisone and oxygen dependent  INTERVAL: No major events  SUBJ: Routine re-eval.  However, she reports 1 week of increased symptoms with wheezing, SOB, chest tightness and nonproductive cough.  Also reports nasal congestion with purulent drainage.  She denies fever, chest pain, hemoptysis, lower extremity edema.  Prior to this past week, she had tried to decrease her prednisone to 10 mg alternating with 5 mg daily.  However, she noted increasing dyspnea with this change and resume taking 10 mg daily.  OBJ: Vitals:   03/07/17 1335 03/07/17 1338  BP:  (!) 138/96  Pulse:  (!) 109  SpO2:  91%  Weight: 86.2 kg (190 lb)   Height: 5\' 6"  (1.676 m)   3 Lpm [place]  NAD @ rest  HEENT: Cushingoid facies Markedly diminished BS, few scattered wheezes RRR s M Moderately obese, NABS, soft No LE edema  IMPRESSION: 1) End stage COPD/emphysema - prednisone dependent 2) Chronic hypoxemic respiratory failure 3) acute COPD exacerbation 4) acute rhinitis  PLAN:   Cont chronic therapies: - Symbicort BID - Tiotropium daily - PRN Duoneb - Supplemental O2 @ 2 LPM [place] - Azithromycin 250 mg daily after doxycycline completed - PRN alprazolam - Flutter valve as needed - Flonase nasal inhaler and nasal lavages - Chest percussion vest BID   Increase prednisone to 40 mg daily for 5 days, then resume her baseline dose of 10 mg daily Doxycycline 100 mg twice daily for 7 days  ROV 6-8 weeks  I have reviewed this patient's medical problems, current medications and therapies and prior pulmonary office notes in evaluation and formulation of the above assessment and plan   Billy Fischeravid Jone Panebianco, MD PCCM service Mobile [phone] Pager [phone] 03/07/2017 2:05 PM

## 2017-03-07 NOTE — Patient Instructions (Signed)
Doxycycline 100 mg twice a day for 7 days Increase prednisone to 40 mg daily for 5 days - I have placed order for 20 mg tablets (10 tablets).  Take 2 at a time with daily until completed.  After completion, resume prednisone at 10 mg/day Continue the rest of your medical regimen as previously We will look into alternatives for the Spiriva Respimat  Follow-up in 6-8 weeks

## 2017-03-11 ENCOUNTER — Encounter: Payer: Self-pay | Admitting: Pulmonary Disease

## 2017-03-11 ENCOUNTER — Other Ambulatory Visit: Payer: Self-pay | Admitting: *Deleted

## 2017-03-11 MED ORDER — ALPRAZOLAM 0.5 MG PO TABS
0.5000 mg | ORAL_TABLET | Freq: Two times a day (BID) | ORAL | 5 refills | Status: DC | PRN
Start: 2017-03-11 — End: 2017-09-07

## 2017-03-16 ENCOUNTER — Encounter: Payer: Self-pay | Admitting: Internal Medicine

## 2017-03-21 ENCOUNTER — Ambulatory Visit: Payer: BLUE CROSS/BLUE SHIELD | Admitting: Pulmonary Disease

## 2017-03-21 ENCOUNTER — Other Ambulatory Visit: Payer: Self-pay | Admitting: Pulmonary Disease

## 2017-05-08 DIAGNOSIS — J479 Bronchiectasis, uncomplicated: Secondary | ICD-10-CM | POA: Diagnosis not present

## 2017-05-16 ENCOUNTER — Telehealth: Payer: Self-pay | Admitting: Pulmonary Disease

## 2017-05-16 MED ORDER — PREDNISONE 20 MG PO TABS: 40.0000 mg | ORAL_TABLET | Freq: Every day | ORAL | 0 refills | Status: AC

## 2017-05-16 MED ORDER — DOXYCYCLINE HYCLATE 100 MG PO TABS: 100.0000 mg | ORAL_TABLET | Freq: Two times a day (BID) | ORAL | 0 refills | Status: DC

## 2017-05-16 NOTE — Telephone Encounter (Signed)
Pt calling stating she is having some congestion and is having some Wheezing along with Chest tightness  She states she has been fighting a cold for a while but can't seem to get over it.   Would like to know if we can send something in, states she is aware of appt next week with Dr Sung AmabileSimonds But is afraid that the weather may change and doesn't want to be stuck with this longer than she needs too  Please advise

## 2017-05-16 NOTE — Telephone Encounter (Signed)
Pt informed. Pt states she needs refill on prednisone for the 40mg  daily x 5 days so it has been sent in to the Pharmacy. Nothing further needed

## 2017-05-16 NOTE — Telephone Encounter (Signed)
Please advise on message below.

## 2017-05-16 NOTE — Addendum Note (Signed)
Addended by: Meyer CoryAHMAD, MISTY R on: 05/16/2017 04:46 PM   Modules accepted: Orders

## 2017-05-16 NOTE — Telephone Encounter (Signed)
Order placed for doxycycline 100 mg BID X 7 days  Also, instruct her to increase her prednisone to 40 mg daily (4 pills) for 5 days  Theodoro Gristave

## 2017-05-22 DIAGNOSIS — R69 Illness, unspecified: Secondary | ICD-10-CM | POA: Diagnosis not present

## 2017-05-24 ENCOUNTER — Encounter: Payer: Self-pay | Admitting: Pulmonary Disease

## 2017-05-24 ENCOUNTER — Ambulatory Visit (INDEPENDENT_AMBULATORY_CARE_PROVIDER_SITE_OTHER): Payer: Medicare HMO | Admitting: Pulmonary Disease

## 2017-05-24 VITALS — BP 130/90 | HR 100 | Ht 66.0 in | Wt 189.0 lb

## 2017-05-24 DIAGNOSIS — J441 Chronic obstructive pulmonary disease with (acute) exacerbation: Secondary | ICD-10-CM | POA: Diagnosis not present

## 2017-05-24 DIAGNOSIS — J9611 Chronic respiratory failure with hypoxia: Secondary | ICD-10-CM | POA: Diagnosis not present

## 2017-05-24 DIAGNOSIS — J449 Chronic obstructive pulmonary disease, unspecified: Secondary | ICD-10-CM | POA: Diagnosis not present

## 2017-05-24 NOTE — Progress Notes (Signed)
PULMONARY OFFICE VISIT  CHRONIC PULMONARY PROBLEMS: Severe COPD, prednisone and oxygen dependent  INTERVAL: Called office 05/16/17 with increased chest congestion, wheezing and chest tightness.  A course of doxycycline was called in to pharmacy for her and it was recommended that she increase her prednisone dose to 40 mg/day for 5 days  SUBJ: As above, she recently had an episode of increased shortness of breath, chest tightness, chest congestion and wheezing.  This is been treated with doxycycline and increased dose of prednisone.  Now, she reports she is returning towards her baseline but it is getting increasingly difficult to get over these bouts and each time her baseline is a little worse than previously. Her medical regimen is documented below. She is using chest percussion vest 2-3 times per day. Remains on prednisone 10 mg daily.  She has been started on low-dose clonazepam by Dr. Darrick Huntsmanullo.  She also has alprazolam which she is only using at nighttime.  However, she reports episodes of extreme anxiety which lead to increased shortness of breath during the daytime as well.  She denies fever, chest pain, hemoptysis, lower extremity edema.  OBJ: Vitals:   05/24/17 1331 05/24/17 1335  BP:  130/90  Pulse:  100  SpO2:  91%  Weight: 85.7 kg (189 lb)   Height: 5\' 6"  (1.676 m)   2 Lpm [place]  NAD @ rest, mildly tremulous HEENT: Cushingoid facies Markedly diminished BS, few scattered wheezes RRR s M Moderately obese, NABS, soft No LE edema  IMPRESSION: 1) End stage COPD/emphysema - prednisone dependent 2) Chronic hypoxemic respiratory failure 3) recent acute COPD exacerbation  PLAN:   Cont chronic therapies: - Symbicort BID - Tiotropium daily - PRN Duoneb - Supplemental O2 @ 2 LPM [place] - Azithromycin 250 mg daily after doxycycline completed - PRN alprazolam - Flutter valve as needed - Flonase nasal inhaler and nasal lavages - Chest percussion vest BID  I instructed that she may use  Xanax more liberally during the day as needed for severe anxiety  Her insurance company is going to discontinue coverage of Spiriva.  When that happens, she is to call here and we will change Spiriva plus Symbicort to Trelegy inhaler  Follow-up in 6-8 weeks  I have reviewed this patient's medical problems, current medications and therapies and prior pulmonary office notes in evaluation and formulation of the above assessment and plan   Billy Fischeravid Simonds, MD PCCM service Mobile [phone] Pager [phone] 05/24/2017 1:43 PM

## 2017-05-24 NOTE — Patient Instructions (Addendum)
Continue Symbicort, Spiriva, DuoNeb, azithromycin, chest percussion vest, prednisone, supplemental oxygen as previously prescribed  You may use Xanax more liberally during the day as needed for severe anxiety  When your Spiriva prescription runs out, call here and we will change Symbicort and Spiriva to Trelegy inhaler  Follow-up in 6-8 weeks

## 2017-05-31 DIAGNOSIS — J449 Chronic obstructive pulmonary disease, unspecified: Secondary | ICD-10-CM | POA: Diagnosis not present

## 2017-05-31 DIAGNOSIS — J441 Chronic obstructive pulmonary disease with (acute) exacerbation: Secondary | ICD-10-CM | POA: Diagnosis not present

## 2017-06-12 DIAGNOSIS — J479 Bronchiectasis, uncomplicated: Secondary | ICD-10-CM | POA: Diagnosis not present

## 2017-06-15 ENCOUNTER — Other Ambulatory Visit: Payer: Self-pay | Admitting: Internal Medicine

## 2017-06-15 DIAGNOSIS — F4323 Adjustment disorder with mixed anxiety and depressed mood: Secondary | ICD-10-CM

## 2017-06-22 ENCOUNTER — Other Ambulatory Visit: Payer: Self-pay | Admitting: Pulmonary Disease

## 2017-06-22 DIAGNOSIS — R69 Illness, unspecified: Secondary | ICD-10-CM | POA: Diagnosis not present

## 2017-06-29 ENCOUNTER — Ambulatory Visit: Payer: Medicare HMO | Admitting: Pulmonary Disease

## 2017-06-29 ENCOUNTER — Encounter: Payer: Self-pay | Admitting: Pulmonary Disease

## 2017-06-29 VITALS — BP 138/86 | HR 96 | Ht 66.0 in | Wt 191.0 lb

## 2017-06-29 DIAGNOSIS — J9611 Chronic respiratory failure with hypoxia: Secondary | ICD-10-CM | POA: Diagnosis not present

## 2017-06-29 DIAGNOSIS — J449 Chronic obstructive pulmonary disease, unspecified: Secondary | ICD-10-CM

## 2017-06-29 MED ORDER — GUAIFENESIN 100 MG/5ML PO LIQD: 400.0000 mg | Freq: Two times a day (BID) | ORAL | 5 refills | Status: AC

## 2017-06-29 MED ORDER — FLUTICASONE-UMECLIDIN-VILANT 100-62.5-25 MCG/INH IN AEPB: 1.0000 | INHALATION_SPRAY | Freq: Every day | RESPIRATORY_TRACT | 5 refills | Status: AC

## 2017-06-29 NOTE — Patient Instructions (Signed)
Change Symbicort and Spiriva to Trelegy inhaler, 1 inhalation daily.  Rinse mouth after use Mucinex liquid 400 mg twice a day Continue the rest of your medications and therapies as previously Follow-up in 6-8 weeks

## 2017-06-30 DIAGNOSIS — J449 Chronic obstructive pulmonary disease, unspecified: Secondary | ICD-10-CM | POA: Diagnosis not present

## 2017-06-30 DIAGNOSIS — J441 Chronic obstructive pulmonary disease with (acute) exacerbation: Secondary | ICD-10-CM | POA: Diagnosis not present

## 2017-07-01 ENCOUNTER — Telehealth: Payer: Self-pay | Admitting: *Deleted

## 2017-07-01 NOTE — Telephone Encounter (Signed)
Pt is asking if you will give her something for the cough. I informed pt to go ahead and do a breathing tx to see if it would help with the cough. Please advise.

## 2017-07-04 NOTE — Progress Notes (Signed)
PULMONARY OFFICE VISIT  CHRONIC PULMONARY PROBLEMS: Severe COPD, prednisone and oxygen dependent  INTERVAL: Last seen 05/24/17.  No major pulmonary or respiratory events since that time  SUBJ: This is a routine reevaluation.  There have been no major events since last visit.  She continues to be extremely limited by shortness of breath.  She remains on Symbicort and Spiriva inhalers.  She uses nebulized albuterol as needed and this provides her modest benefit at best.  She remains on supplemental oxygen.  She continues to take a azithromycin as previously prescribed.  She is using albuterol as needed 1-2 times per day. She continues to use chest percussion vest 2-3 times per day.  She remains on prednisone 10 mg daily.  She denies fever, chest pain, hemoptysis, lower extremity edema.   She continues to struggle to cope emotionally with her end-stage COPD.  This is also been an emotional challenge for her husband and other family and friends.  She continues to have palliative care services at home and believes that they have been beneficial to her and to her family.   OBJ: Vitals:   06/29/17 1204 06/29/17 1205  BP:  138/86  Pulse:  96  SpO2:  96%  Weight: 86.6 kg (191 lb)   Height: 5\' 6"  (1.676 m)   2 Lpm [place]  NAD @ rest in wheelchair HEENT: Cushingoid facies no acute findings,  Markedly diminished BS,  no wheezes RRR s M Moderately obese, NABS, soft No LE edema No focal neurologic deficits  IMPRESSION: 1) End stage COPD/emphysema - prednisone dependent 2) Chronic hypoxemic respiratory failure 3) recent acute COPD exacerbation, resolved  PLAN:  For simplicity sake, we will change the Symbicort and Spiriva inhalers to Trelegy.  She is to take 1 inhalation daily and rinse mouth after use.  In addition, I have recommended that she try Mucinex 400 mg twice a day in liquid form to help thin secretions.  Cont other chronic therapies: - PRN albuterol nebulized - Supplemental O2 @ 2  LPM [place] - Azithromycin 250 mg daily after doxycycline completed - PRN alprazolam - Flonase nasal inhaler and nasal lavages - Chest percussion vest BID  Follow-up in 6-8 weeks  I have reviewed this patient's medical problems, current medications and therapies and prior pulmonary office notes in evaluation and formulation of the above assessment and plan   Billy Fischeravid Saphia Vanderford, MD PCCM service Mobile [phone] Pager [phone] 07/04/2017 3:56 PM

## 2017-07-05 ENCOUNTER — Ambulatory Visit: Payer: Medicare HMO | Admitting: Pulmonary Disease

## 2017-07-05 MED ORDER — AMOXICILLIN-POT CLAVULANATE 875-125 MG PO TABS: 1.0000 | ORAL_TABLET | Freq: Two times a day (BID) | ORAL | 0 refills | Status: DC

## 2017-07-05 MED ORDER — LEVOFLOXACIN 500 MG PO TABS: 500.0000 mg | ORAL_TABLET | Freq: Every day | ORAL | 0 refills | Status: DC

## 2017-07-05 NOTE — Telephone Encounter (Signed)
States breathing tx are helping some but she feels congested and NP cough and wheezing. When blowing nose it comes out green. Doing Albuterol neb Q4 hrs. Please advise.

## 2017-07-05 NOTE — Telephone Encounter (Signed)
Per DS, Levaquin 500 mg daily x5 days. Pt states she is allergic. Per ds, send augmentin 875 mg bid x 5 days. Pt informed. Stop azithromycin until augmentin completed.

## 2017-07-06 MED ORDER — DOXYCYCLINE HYCLATE 100 MG PO TABS: 100.0000 mg | ORAL_TABLET | Freq: Two times a day (BID) | ORAL | 0 refills | Status: DC

## 2017-07-06 NOTE — Telephone Encounter (Signed)
Pt called back stating she is allergic to penicillin. Per DS, send in Doxycycline 100mg  bid x 5 days. Pt informed.

## 2017-07-06 NOTE — Addendum Note (Signed)
Addended by: Meyer CoryAHMAD, Silveria Botz R on: 07/06/2017 12:50 PM   Modules accepted: Orders

## 2017-07-20 ENCOUNTER — Other Ambulatory Visit: Payer: Self-pay | Admitting: Pulmonary Disease

## 2017-07-20 DIAGNOSIS — R69 Illness, unspecified: Secondary | ICD-10-CM | POA: Diagnosis not present

## 2017-07-25 ENCOUNTER — Other Ambulatory Visit: Payer: Self-pay | Admitting: Internal Medicine

## 2017-07-26 ENCOUNTER — Telehealth: Payer: Self-pay | Admitting: Pulmonary Disease

## 2017-07-26 MED ORDER — PREDNISONE 20 MG PO TABS: 20.0000 mg | ORAL_TABLET | Freq: Every day | ORAL | 0 refills | Status: AC

## 2017-07-26 NOTE — Telephone Encounter (Signed)
lmtcb x1 for pt. 

## 2017-07-26 NOTE — Telephone Encounter (Signed)
Patient returning call please call home number

## 2017-07-26 NOTE — Telephone Encounter (Signed)
Patient has been having some issues and would like to know whether she should come in to see Dr. Sung AmabileSimonds or if he can prescribe some medication Would like to speak with Sonya Please call to discuss

## 2017-07-26 NOTE — Telephone Encounter (Signed)
Pt is having increased sob and chest tightness. DS called in Doxycyline 100 mgs on 07/01/17 for 5 days. She is on Prednisone 10 mg daily. She has f/u with DS on 08/09/17. [facility] [address] Please advise?

## 2017-07-26 NOTE — Telephone Encounter (Signed)
Prednisone to 20 mg and needs to be seen in office or symptoms get worse need to see urgent care or ER

## 2017-07-29 ENCOUNTER — Telehealth: Payer: Self-pay | Admitting: *Deleted

## 2017-07-29 ENCOUNTER — Ambulatory Visit: Payer: Medicare HMO | Admitting: Pulmonary Disease

## 2017-07-29 DIAGNOSIS — J449 Chronic obstructive pulmonary disease, unspecified: Secondary | ICD-10-CM | POA: Diagnosis not present

## 2017-07-29 DIAGNOSIS — J441 Chronic obstructive pulmonary disease with (acute) exacerbation: Secondary | ICD-10-CM | POA: Diagnosis not present

## 2017-07-29 NOTE — Telephone Encounter (Signed)
Pt informed of response and will call back Monday if no better. Nothing further needed.

## 2017-07-29 NOTE — Telephone Encounter (Signed)
Try Robitussin DM. If that doesn't work, we will call in some Tussionex  Theodoro GristDave

## 2017-07-29 NOTE — Telephone Encounter (Signed)
Pt is calling stating she started coughing really bad last night and states it is non productive. She would like to know what she can take for the cough. Please advise.

## 2017-08-09 DIAGNOSIS — J9610 Chronic respiratory failure, unspecified whether with hypoxia or hypercapnia: Secondary | ICD-10-CM | POA: Diagnosis not present

## 2017-08-09 DIAGNOSIS — J309 Allergic rhinitis, unspecified: Secondary | ICD-10-CM | POA: Diagnosis not present

## 2017-08-09 DIAGNOSIS — I10 Essential (primary) hypertension: Secondary | ICD-10-CM | POA: Diagnosis not present

## 2017-08-09 DIAGNOSIS — Z7952 Long term (current) use of systemic steroids: Secondary | ICD-10-CM | POA: Diagnosis not present

## 2017-08-09 DIAGNOSIS — Z809 Family history of malignant neoplasm, unspecified: Secondary | ICD-10-CM | POA: Diagnosis not present

## 2017-08-09 DIAGNOSIS — Z7951 Long term (current) use of inhaled steroids: Secondary | ICD-10-CM | POA: Diagnosis not present

## 2017-08-09 DIAGNOSIS — Z823 Family history of stroke: Secondary | ICD-10-CM | POA: Diagnosis not present

## 2017-08-09 DIAGNOSIS — J449 Chronic obstructive pulmonary disease, unspecified: Secondary | ICD-10-CM | POA: Diagnosis not present

## 2017-08-09 DIAGNOSIS — R69 Illness, unspecified: Secondary | ICD-10-CM | POA: Diagnosis not present

## 2017-08-16 ENCOUNTER — Ambulatory Visit: Payer: Medicare HMO | Admitting: Pulmonary Disease

## 2017-08-16 ENCOUNTER — Encounter: Payer: Self-pay | Admitting: Pulmonary Disease

## 2017-08-16 VITALS — BP 132/88 | HR 80 | Ht 66.0 in

## 2017-08-16 DIAGNOSIS — J449 Chronic obstructive pulmonary disease, unspecified: Secondary | ICD-10-CM | POA: Diagnosis not present

## 2017-08-16 NOTE — Patient Instructions (Signed)
Continue Trelegy inhaler Continue nebulized albuterol as needed Continue supplemental oxygen at 2 L/min is close to 24 hours/day as possible Continue chest percussion vest twice a day Follow-up in 6-8 weeks

## 2017-08-23 NOTE — Progress Notes (Signed)
PULMONARY OFFICE VISIT  CHRONIC PULMONARY PROBLEMS: Severe COPD, prednisone and oxygen dependent  INTERVAL: Last seen 02/27. Per Dr Clovis FredricksonKasa's instructions, she increased prednisone to 20 mg daily X 10 days earlier this month.  SUBJ: This is a routine reevaluation. She had increased dyspnea, chest tightness and wheezing. Iincreased prednisone X 10 days as noted above. Now back to her baseline. Remains profoundly limited by DOE. Denies CP, purulent sputum, hemoptysis, LE edema, calf tenderness. Using nebs 4-5 times per day. She remains on prednisone 10 mg daily.     OBJ: Vitals:   08/16/17 1116  BP: 132/88  Pulse: 80  SpO2: 95%  Height: 5\' 6"  (1.676 m)  2 LPM [place]  NAD @ rest in wheelchair HEENT: Cushingoid, no acute findings Markedly diminished BS,  no wheezes RRR s M Moderately obese, NABS, soft No LE edema No focal neurologic deficits  IMPRESSION: 1) End stage COPD/emphysema - prednisone dependent 2) Chronic hypoxemic respiratory failure 3) recent acute COPD exacerbation, resolved  PLAN:  For simplicity sake, we will change the Symbicort and Spiriva inhalers to Trelegy.  She is to take 1 inhalation daily and rinse mouth after use.  In addition, I have recommended that she try Mucinex 400 mg twice a day in liquid form to help thin secretions.  Cont other chronic therapies: - Trelegy - PRN albuterol nebulized - Supplemental O2 @ 2 LPM [place] - Azithromycin 250 mg daily  - PRN alprazolam - Flonase nasal inhaler and nasal lavages - Chest percussion vest BID - she is using compliantly and benefiting from it  Follow-up in 6-8 weeks  I have reviewed this patient's medical problems, current medications and therapies and prior pulmonary office notes in evaluation and formulation of the above assessment and plan   Diane Fischeravid Vikkie Goeden, MD PCCM service Mobile [phone] Pager [phone] 08/23/2017 9:14 PM

## 2017-08-29 DIAGNOSIS — J449 Chronic obstructive pulmonary disease, unspecified: Secondary | ICD-10-CM | POA: Diagnosis not present

## 2017-08-29 DIAGNOSIS — J441 Chronic obstructive pulmonary disease with (acute) exacerbation: Secondary | ICD-10-CM | POA: Diagnosis not present

## 2017-09-01 ENCOUNTER — Other Ambulatory Visit: Payer: Self-pay | Admitting: Internal Medicine

## 2017-09-02 NOTE — Telephone Encounter (Signed)
Last OV 11/17/16. last refill 10/518

## 2017-09-04 ENCOUNTER — Encounter: Payer: Self-pay | Admitting: Internal Medicine

## 2017-09-04 ENCOUNTER — Encounter: Payer: Self-pay | Admitting: Pulmonary Disease

## 2017-09-05 ENCOUNTER — Encounter: Payer: Self-pay | Admitting: Pulmonary Disease

## 2017-09-07 ENCOUNTER — Other Ambulatory Visit: Payer: Self-pay | Admitting: *Deleted

## 2017-09-07 ENCOUNTER — Encounter: Payer: Self-pay | Admitting: *Deleted

## 2017-09-07 MED ORDER — ALPRAZOLAM 0.5 MG PO TABS: 0.5000 mg | ORAL_TABLET | Freq: Two times a day (BID) | ORAL | 0 refills | Status: AC | PRN

## 2017-09-13 ENCOUNTER — Encounter: Payer: Self-pay | Admitting: Pulmonary Disease

## 2017-09-14 ENCOUNTER — Other Ambulatory Visit: Payer: Self-pay | Admitting: *Deleted

## 2017-09-14 MED ORDER — ONDANSETRON HCL 4 MG PO TABS: 4.0000 mg | ORAL_TABLET | Freq: Four times a day (QID) | ORAL | 1 refills | Status: AC | PRN

## 2017-09-14 MED ORDER — LEVOFLOXACIN 500 MG PO TABS: 500.0000 mg | ORAL_TABLET | Freq: Every day | ORAL | 0 refills | Status: AC

## 2017-09-14 NOTE — Telephone Encounter (Signed)
Per DS, send in Levaquin 500 mg daily x 7 days and Zofran 4 mg Q6hrs PRN, disp 30 with 1 refill. Pt informed RXs sent to [facility].

## 2017-09-15 ENCOUNTER — Encounter: Payer: Self-pay | Admitting: Emergency Medicine

## 2017-09-15 ENCOUNTER — Inpatient Hospital Stay
Admission: EM | Admit: 2017-09-15 | Discharge: 2017-10-01 | DRG: 190 | Disposition: E | Payer: Medicare HMO | Attending: Internal Medicine | Admitting: Internal Medicine

## 2017-09-15 ENCOUNTER — Emergency Department: Payer: Medicare HMO

## 2017-09-15 ENCOUNTER — Other Ambulatory Visit: Payer: Self-pay

## 2017-09-15 DIAGNOSIS — I214 Non-ST elevation (NSTEMI) myocardial infarction: Secondary | ICD-10-CM

## 2017-09-15 DIAGNOSIS — R748 Abnormal levels of other serum enzymes: Secondary | ICD-10-CM | POA: Diagnosis not present

## 2017-09-15 DIAGNOSIS — Z8701 Personal history of pneumonia (recurrent): Secondary | ICD-10-CM | POA: Diagnosis not present

## 2017-09-15 DIAGNOSIS — I10 Essential (primary) hypertension: Secondary | ICD-10-CM | POA: Diagnosis not present

## 2017-09-15 DIAGNOSIS — K219 Gastro-esophageal reflux disease without esophagitis: Secondary | ICD-10-CM | POA: Diagnosis present

## 2017-09-15 DIAGNOSIS — F419 Anxiety disorder, unspecified: Secondary | ICD-10-CM | POA: Diagnosis present

## 2017-09-15 DIAGNOSIS — R443 Hallucinations, unspecified: Secondary | ICD-10-CM | POA: Diagnosis present

## 2017-09-15 DIAGNOSIS — J9621 Acute and chronic respiratory failure with hypoxia: Secondary | ICD-10-CM | POA: Diagnosis not present

## 2017-09-15 DIAGNOSIS — Z6832 Body mass index (BMI) 32.0-32.9, adult: Secondary | ICD-10-CM

## 2017-09-15 DIAGNOSIS — Z9981 Dependence on supplemental oxygen: Secondary | ICD-10-CM

## 2017-09-15 DIAGNOSIS — Z7951 Long term (current) use of inhaled steroids: Secondary | ICD-10-CM

## 2017-09-15 DIAGNOSIS — J9622 Acute and chronic respiratory failure with hypercapnia: Secondary | ICD-10-CM | POA: Diagnosis present

## 2017-09-15 DIAGNOSIS — R5381 Other malaise: Secondary | ICD-10-CM | POA: Diagnosis not present

## 2017-09-15 DIAGNOSIS — J441 Chronic obstructive pulmonary disease with (acute) exacerbation: Secondary | ICD-10-CM | POA: Diagnosis not present

## 2017-09-15 DIAGNOSIS — J440 Chronic obstructive pulmonary disease with acute lower respiratory infection: Secondary | ICD-10-CM | POA: Diagnosis present

## 2017-09-15 DIAGNOSIS — Z79899 Other long term (current) drug therapy: Secondary | ICD-10-CM | POA: Diagnosis not present

## 2017-09-15 DIAGNOSIS — R0602 Shortness of breath: Secondary | ICD-10-CM | POA: Diagnosis not present

## 2017-09-15 DIAGNOSIS — Z66 Do not resuscitate: Secondary | ICD-10-CM | POA: Diagnosis present

## 2017-09-15 DIAGNOSIS — F329 Major depressive disorder, single episode, unspecified: Secondary | ICD-10-CM | POA: Diagnosis present

## 2017-09-15 DIAGNOSIS — G9340 Encephalopathy, unspecified: Secondary | ICD-10-CM | POA: Diagnosis present

## 2017-09-15 DIAGNOSIS — J9602 Acute respiratory failure with hypercapnia: Secondary | ICD-10-CM | POA: Diagnosis not present

## 2017-09-15 DIAGNOSIS — J209 Acute bronchitis, unspecified: Secondary | ICD-10-CM | POA: Diagnosis present

## 2017-09-15 DIAGNOSIS — Z886 Allergy status to analgesic agent status: Secondary | ICD-10-CM | POA: Diagnosis not present

## 2017-09-15 DIAGNOSIS — R69 Illness, unspecified: Secondary | ICD-10-CM | POA: Diagnosis not present

## 2017-09-15 DIAGNOSIS — Z7189 Other specified counseling: Secondary | ICD-10-CM

## 2017-09-15 DIAGNOSIS — Z87891 Personal history of nicotine dependence: Secondary | ICD-10-CM | POA: Diagnosis not present

## 2017-09-15 DIAGNOSIS — J449 Chronic obstructive pulmonary disease, unspecified: Secondary | ICD-10-CM

## 2017-09-15 DIAGNOSIS — J411 Mucopurulent chronic bronchitis: Secondary | ICD-10-CM | POA: Diagnosis not present

## 2017-09-15 DIAGNOSIS — J9601 Acute respiratory failure with hypoxia: Secondary | ICD-10-CM

## 2017-09-15 DIAGNOSIS — G4733 Obstructive sleep apnea (adult) (pediatric): Secondary | ICD-10-CM | POA: Diagnosis present

## 2017-09-15 DIAGNOSIS — E785 Hyperlipidemia, unspecified: Secondary | ICD-10-CM | POA: Diagnosis not present

## 2017-09-15 DIAGNOSIS — R0603 Acute respiratory distress: Secondary | ICD-10-CM | POA: Diagnosis not present

## 2017-09-15 DIAGNOSIS — Z515 Encounter for palliative care: Secondary | ICD-10-CM | POA: Diagnosis not present

## 2017-09-15 DIAGNOSIS — Z8249 Family history of ischemic heart disease and other diseases of the circulatory system: Secondary | ICD-10-CM | POA: Diagnosis not present

## 2017-09-15 DIAGNOSIS — M6281 Muscle weakness (generalized): Secondary | ICD-10-CM | POA: Diagnosis not present

## 2017-09-15 LAB — COMPREHENSIVE METABOLIC PANEL
ALT: 27 U/L (ref 14–54)
AST: 40 U/L (ref 15–41)
Albumin: 4 g/dL (ref 3.5–5.0)
Alkaline Phosphatase: 47 U/L (ref 38–126)
Anion gap: 12 (ref 5–15)
BUN: 6 mg/dL (ref 6–20)
CHLORIDE: 98 mmol/L — AB (ref 101–111)
CO2: 25 mmol/L (ref 22–32)
Calcium: 9.3 mg/dL (ref 8.9–10.3)
Creatinine, Ser: 0.67 mg/dL (ref 0.44–1.00)
Glucose, Bld: 250 mg/dL — ABNORMAL HIGH (ref 65–99)
POTASSIUM: 4.1 mmol/L (ref 3.5–5.1)
Sodium: 135 mmol/L (ref 135–145)
Total Bilirubin: 0.7 mg/dL (ref 0.3–1.2)
Total Protein: 6.9 g/dL (ref 6.5–8.1)

## 2017-09-15 LAB — CBC
HCT: 40.3 % (ref 35.0–47.0)
Hemoglobin: 13.4 g/dL (ref 12.0–16.0)
MCH: 29.5 pg (ref 26.0–34.0)
MCHC: 33.2 g/dL (ref 32.0–36.0)
MCV: 89.1 fL (ref 80.0–100.0)
PLATELETS: 401 10*3/uL (ref 150–440)
RBC: 4.53 MIL/uL (ref 3.80–5.20)
RDW: 14.4 % (ref 11.5–14.5)
WBC: 18.1 10*3/uL — ABNORMAL HIGH (ref 3.6–11.0)

## 2017-09-15 LAB — TROPONIN I
TROPONIN I: 4.13 ng/mL — AB (ref <0.03)
TROPONIN I: 2.99 ng/mL — AB (ref <0.03)

## 2017-09-15 LAB — GLUCOSE, CAPILLARY: GLUCOSE-CAPILLARY: 152 mg/dL — AB (ref 65–99)

## 2017-09-15 LAB — BLOOD GAS, ARTERIAL
ACID-BASE EXCESS: 2 mmol/L (ref 0.0–2.0)
Bicarbonate: 30.3 mmol/L — ABNORMAL HIGH (ref 20.0–28.0)
Expiratory PAP: 6
FIO2: 0.32
INSPIRATORY PAP: 18
Mode: POSITIVE
O2 SAT: 94.6 %
PCO2 ART: 63 mmHg — AB (ref 32.0–48.0)
PO2 ART: 82 mmHg — AB (ref 83.0–108.0)
Patient temperature: 37
RATE: 8 resp/min
pH, Arterial: 7.29 — ABNORMAL LOW (ref 7.350–7.450)

## 2017-09-15 LAB — BRAIN NATRIURETIC PEPTIDE: B Natriuretic Peptide: 50 pg/mL (ref 0.0–100.0)

## 2017-09-15 LAB — APTT: aPTT: 30 seconds (ref 24–36)

## 2017-09-15 LAB — PROTIME-INR
INR: 0.92
Prothrombin Time: 12.3 seconds (ref 11.4–15.2)

## 2017-09-15 LAB — MRSA PCR SCREENING: MRSA BY PCR: NEGATIVE

## 2017-09-15 MED ORDER — ENOXAPARIN SODIUM 40 MG/0.4ML ~~LOC~~ SOLN: 40.0000 mg | SUBCUTANEOUS | Status: DC

## 2017-09-15 MED ORDER — ALBUTEROL (5 MG/ML) CONTINUOUS INHALATION SOLN
7.5000 mg/h | INHALATION_SOLUTION | Freq: Once | RESPIRATORY_TRACT | Status: AC
  Administered 2017-09-15: 7.5 mg/h via RESPIRATORY_TRACT
  Filled 2017-09-15: qty 20

## 2017-09-15 MED ORDER — SODIUM CHLORIDE 0.9 % IV SOLN: INTRAVENOUS | Status: DC

## 2017-09-15 MED ORDER — LACTATED RINGERS IV SOLN
INTRAVENOUS | Status: DC
  Administered 2017-09-15 – 2017-09-17 (×3): via INTRAVENOUS
  Administered 2017-09-17: 100 mL/h via INTRAVENOUS
  Administered 2017-09-18 – 2017-09-20 (×3): via INTRAVENOUS

## 2017-09-15 MED ORDER — MAGNESIUM SULFATE 2 GM/50ML IV SOLN
2.0000 g | Freq: Once | INTRAVENOUS | Status: AC
  Administered 2017-09-15: 2 g via INTRAVENOUS

## 2017-09-15 MED ORDER — HEPARIN (PORCINE) IN NACL 100-0.45 UNIT/ML-% IJ SOLN
950.0000 [IU]/h | INTRAMUSCULAR | Status: DC
  Administered 2017-09-15: 950 [IU]/h via INTRAVENOUS
  Filled 2017-09-15: qty 250

## 2017-09-15 MED ORDER — VITAMIN D 1000 UNITS PO TABS
2000.0000 [IU] | ORAL_TABLET | Freq: Every day | ORAL | Status: DC
  Administered 2017-09-16 – 2017-09-17 (×2): 2000 [IU] via ORAL
  Filled 2017-09-15 (×3): qty 2

## 2017-09-15 MED ORDER — ONDANSETRON HCL 4 MG PO TABS: 4.0000 mg | ORAL_TABLET | Freq: Four times a day (QID) | ORAL | Status: DC | PRN

## 2017-09-15 MED ORDER — MORPHINE SULFATE (PF) 2 MG/ML IV SOLN
2.0000 mg | Freq: Once | INTRAVENOUS | Status: AC
  Administered 2017-09-15: 2 mg via INTRAVENOUS

## 2017-09-15 MED ORDER — HYDROCODONE-ACETAMINOPHEN 5-325 MG PO TABS: 1.0000 | ORAL_TABLET | ORAL | Status: DC | PRN

## 2017-09-15 MED ORDER — BISACODYL 10 MG RE SUPP: 10.0000 mg | Freq: Every day | RECTAL | Status: DC | PRN

## 2017-09-15 MED ORDER — NITROGLYCERIN 0.4 MG SL SUBL: 0.4000 mg | SUBLINGUAL_TABLET | SUBLINGUAL | Status: DC | PRN

## 2017-09-15 MED ORDER — METOPROLOL TARTRATE 5 MG/5ML IV SOLN
2.5000 mg | INTRAVENOUS | Status: DC | PRN
  Administered 2017-09-15: 5 mg via INTRAVENOUS
  Administered 2017-09-15: 2.5 mg via INTRAVENOUS
  Administered 2017-09-16: 5 mg via INTRAVENOUS
  Administered 2017-09-17 (×2): 2.5 mg via INTRAVENOUS
  Filled 2017-09-15 (×5): qty 5

## 2017-09-15 MED ORDER — ALBUTEROL SULFATE (2.5 MG/3ML) 0.083% IN NEBU
2.5000 mg | INHALATION_SOLUTION | RESPIRATORY_TRACT | Status: DC | PRN
  Administered 2017-09-15 – 2017-09-17 (×4): 2.5 mg via RESPIRATORY_TRACT
  Filled 2017-09-15 (×4): qty 3

## 2017-09-15 MED ORDER — DOXYCYCLINE HYCLATE 100 MG PO TABS: 100.0000 mg | ORAL_TABLET | Freq: Two times a day (BID) | ORAL | Status: DC

## 2017-09-15 MED ORDER — SERTRALINE HCL 25 MG PO TABS
25.0000 mg | ORAL_TABLET | Freq: Every day | ORAL | Status: DC
  Administered 2017-09-16 – 2017-09-18 (×3): 25 mg via ORAL
  Filled 2017-09-15 (×7): qty 1

## 2017-09-15 MED ORDER — ALBUTEROL (5 MG/ML) CONTINUOUS INHALATION SOLN
10.0000 mg/h | INHALATION_SOLUTION | Freq: Once | RESPIRATORY_TRACT | Status: DC
Start: 2017-09-15 — End: 2017-09-15

## 2017-09-15 MED ORDER — METHYLPREDNISOLONE SODIUM SUCC 125 MG IJ SOLR
80.0000 mg | Freq: Two times a day (BID) | INTRAMUSCULAR | Status: DC
  Administered 2017-09-15 – 2017-09-19 (×8): 80 mg via INTRAVENOUS
  Filled 2017-09-15 (×8): qty 2

## 2017-09-15 MED ORDER — FLUTICASONE PROPIONATE 50 MCG/ACT NA SUSP
2.0000 | Freq: Every day | NASAL | Status: DC
  Filled 2017-09-15: qty 16

## 2017-09-15 MED ORDER — ALBUTEROL SULFATE (2.5 MG/3ML) 0.083% IN NEBU
INHALATION_SOLUTION | RESPIRATORY_TRACT | Status: AC
  Filled 2017-09-15: qty 9

## 2017-09-15 MED ORDER — LORAZEPAM 2 MG/ML IJ SOLN
1.0000 mg | Freq: Once | INTRAMUSCULAR | Status: AC
  Administered 2017-09-15: 1 mg via INTRAVENOUS

## 2017-09-15 MED ORDER — HEPARIN BOLUS VIA INFUSION
4000.0000 [IU] | Freq: Once | INTRAVENOUS | Status: AC
  Administered 2017-09-15: 4000 [IU] via INTRAVENOUS
  Filled 2017-09-15: qty 4000

## 2017-09-15 MED ORDER — ADULT MULTIVITAMIN W/MINERALS CH
1.0000 | ORAL_TABLET | Freq: Every day | ORAL | Status: DC
  Administered 2017-09-16 – 2017-09-17 (×2): 1 via ORAL
  Filled 2017-09-15 (×3): qty 1

## 2017-09-15 MED ORDER — SENNOSIDES-DOCUSATE SODIUM 8.6-50 MG PO TABS: 1.0000 | ORAL_TABLET | Freq: Every evening | ORAL | Status: DC | PRN

## 2017-09-15 MED ORDER — ONDANSETRON HCL 4 MG/2ML IJ SOLN: 4.0000 mg | Freq: Four times a day (QID) | INTRAMUSCULAR | Status: DC | PRN

## 2017-09-15 MED ORDER — LOSARTAN POTASSIUM 50 MG PO TABS
50.0000 mg | ORAL_TABLET | Freq: Every day | ORAL | Status: DC
  Administered 2017-09-16 – 2017-09-18 (×2): 50 mg via ORAL
  Filled 2017-09-15 (×3): qty 1

## 2017-09-15 MED ORDER — MELATONIN 5 MG PO TABS: 5.0000 mg | ORAL_TABLET | Freq: Every day | ORAL | Status: DC

## 2017-09-15 MED ORDER — FLUTICASONE-UMECLIDIN-VILANT 100-62.5-25 MCG/INH IN AEPB: 1.0000 | INHALATION_SPRAY | Freq: Every day | RESPIRATORY_TRACT | Status: DC

## 2017-09-15 MED ORDER — ENOXAPARIN SODIUM 100 MG/ML ~~LOC~~ SOLN
85.0000 mg | Freq: Two times a day (BID) | SUBCUTANEOUS | Status: AC
  Administered 2017-09-15 – 2017-09-16 (×4): 85 mg via SUBCUTANEOUS
  Filled 2017-09-15 (×4): qty 1

## 2017-09-15 MED ORDER — ACETAMINOPHEN 325 MG PO TABS: 650.0000 mg | ORAL_TABLET | Freq: Four times a day (QID) | ORAL | Status: DC | PRN

## 2017-09-15 MED ORDER — IPRATROPIUM-ALBUTEROL 0.5-2.5 (3) MG/3ML IN SOLN
3.0000 mL | Freq: Four times a day (QID) | RESPIRATORY_TRACT | Status: DC
  Administered 2017-09-15 – 2017-09-19 (×16): 3 mL via RESPIRATORY_TRACT
  Filled 2017-09-15 (×16): qty 3

## 2017-09-15 MED ORDER — LORATADINE 10 MG PO TABS: 10.0000 mg | ORAL_TABLET | Freq: Every day | ORAL | Status: DC

## 2017-09-15 MED ORDER — LEVOFLOXACIN IN D5W 500 MG/100ML IV SOLN
500.0000 mg | INTRAVENOUS | Status: DC
  Administered 2017-09-15 – 2017-09-18 (×4): 500 mg via INTRAVENOUS
  Filled 2017-09-15 (×5): qty 100

## 2017-09-15 MED ORDER — METHYLPREDNISOLONE SODIUM SUCC 125 MG IJ SOLR
60.0000 mg | INTRAMUSCULAR | Status: DC
  Administered 2017-09-15: 60 mg via INTRAVENOUS
  Filled 2017-09-15: qty 2

## 2017-09-15 MED ORDER — MORPHINE SULFATE (PF) 2 MG/ML IV SOLN
INTRAVENOUS | Status: AC
  Administered 2017-09-15: 2 mg via INTRAVENOUS
  Filled 2017-09-15: qty 1

## 2017-09-15 MED ORDER — BUDESONIDE 0.25 MG/2ML IN SUSP
0.2500 mg | Freq: Four times a day (QID) | RESPIRATORY_TRACT | Status: DC
  Administered 2017-09-15 – 2017-09-19 (×16): 0.25 mg via RESPIRATORY_TRACT
  Filled 2017-09-15 (×16): qty 2

## 2017-09-15 MED ORDER — LORAZEPAM 2 MG/ML IJ SOLN
0.5000 mg | INTRAMUSCULAR | Status: DC | PRN
  Administered 2017-09-15 – 2017-09-20 (×17): 1 mg via INTRAVENOUS
  Filled 2017-09-15 (×20): qty 1

## 2017-09-15 MED ORDER — IPRATROPIUM-ALBUTEROL 0.5-2.5 (3) MG/3ML IN SOLN: 3.0000 mL | RESPIRATORY_TRACT | Status: DC

## 2017-09-15 MED ORDER — ATORVASTATIN CALCIUM 20 MG PO TABS: 40.0000 mg | ORAL_TABLET | Freq: Every day | ORAL | Status: DC

## 2017-09-15 MED ORDER — MORPHINE SULFATE (PF) 2 MG/ML IV SOLN
2.0000 mg | INTRAVENOUS | Status: DC | PRN
  Administered 2017-09-15 – 2017-09-19 (×26): 2 mg via INTRAVENOUS
  Filled 2017-09-15 (×26): qty 1

## 2017-09-15 MED ORDER — ACETAMINOPHEN 650 MG RE SUPP: 650.0000 mg | Freq: Four times a day (QID) | RECTAL | Status: DC | PRN

## 2017-09-15 MED ORDER — CLOPIDOGREL BISULFATE 75 MG PO TABS
75.0000 mg | ORAL_TABLET | Freq: Every day | ORAL | Status: DC
  Administered 2017-09-15 – 2017-09-18 (×4): 75 mg via ORAL
  Filled 2017-09-15 (×4): qty 1

## 2017-09-15 MED ORDER — ENOXAPARIN SODIUM 40 MG/0.4ML ~~LOC~~ SOLN
40.0000 mg | SUBCUTANEOUS | Status: DC
  Administered 2017-09-17 – 2017-09-18 (×2): 40 mg via SUBCUTANEOUS
  Filled 2017-09-15 (×2): qty 0.4

## 2017-09-15 MED ORDER — ALPRAZOLAM 0.5 MG PO TABS: 0.5000 mg | ORAL_TABLET | Freq: Two times a day (BID) | ORAL | Status: DC | PRN

## 2017-09-15 MED ORDER — MORPHINE SULFATE (PF) 2 MG/ML IV SOLN: 2.0000 mg | INTRAVENOUS | Status: DC | PRN

## 2017-09-15 MED ORDER — METOPROLOL SUCCINATE ER 50 MG PO TB24
50.0000 mg | ORAL_TABLET | Freq: Every day | ORAL | Status: DC
  Administered 2017-09-16 – 2017-09-18 (×2): 50 mg via ORAL
  Filled 2017-09-15 (×3): qty 1
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
## 2017-09-15 NOTE — Consult Note (Addendum)
PULMONARY / CRITICAL CARE MEDICINE   Name: Diane [name] MRN: [id number] DOB: 1955/09/03    ADMISSION DATE:  20-Sep-2017  PT PROFILE: 68 F with very severe (end-stage) COPD admitted with respiratory distress due to acute exacerbation COPD.  Patient is DNR/DNI  HISTORY OF PRESENT ILLNESS:   Patient is very well-known to me as I follow her in the office and see her frequently.  At baseline, she has very severe COPD and debilitation due to exertional dyspnea.  She is maintained on a chronic dose of prednisone at 10 mg daily.  She is on maximum bronchodilator therapy at home and is also maintained on chronic oxygen therapy.  On the day prior to this admission, she called our office reporting purulent sputum.  A prescription for levofloxacin was called in.  However, she presented to the emergency department on the morning of this admission with severe respiratory distress and was placed on BiPAP.  She denies fever, hemoptysis, lower extremity edema and calf tenderness.  She has had chest pain, not further characterized, now resolved.  PAST MEDICAL HISTORY :  She  has a past medical history of Anxiety, Bronchitis, COPD (chronic obstructive pulmonary disease) (HCC), Cough, Depression, Edema, Environmental allergies, GERD (gastroesophageal reflux disease), Headache, Heart murmur, Hyperlipidemia, Hypertension, Lower extremity edema, Murmur, Occasional tremors, Orthopnea, Orthopnea, Oxygen deficiency, Pneumonia, Respiratory disorder, Sleep apnea, Tobacco abuse, Tremors of nervous system, Tremors of nervous system, Wheezing, and Wheezing.  PAST SURGICAL HISTORY: She  has a past surgical history that includes Tubal ligation; Fracture surgery (2007); Cataract extraction w/PHACO (Right, 03/25/2015); Eye surgery; and Cataract extraction w/PHACO (Left, 02/03/2016).  Allergies  Allergen Reactions  . Aspirin Swelling  . Penicillins Hives    Has patient had a PCN reaction causing immediate rash,  facial/tongue/throat swelling, SOB or lightheadedness with hypotension: Yes Has patient had a PCN reaction causing severe rash involving mucus membranes or skin necrosis: No Has patient had a PCN reaction that required hospitalization: No Has patient had a PCN reaction occurring within the last 10 years: No If all of the above answers are "NO", then may proceed with Cephalosporin use.  . Levaquin [Levofloxacin] Nausea Only    No current facility-administered medications on file prior to encounter.    Current Outpatient Medications on File Prior to Encounter  Medication Sig  . albuterol (PROVENTIL) (2.5 MG/3ML) 0.083% nebulizer solution USE 1 VIAL IN NEBULIZER EVERY 4 HOURS AS NEEDED  . ALPRAZolam (XANAX) 0.5 MG tablet Take 1 tablet (0.5 mg total) by mouth 2 (two) times daily as needed for anxiety or sleep.  . Cholecalciferol (VITAMIN D-3 PO) Take 2,000 Units by mouth daily.  . clonazePAM (KLONOPIN) 0.5 MG tablet TAKE 1 TABLET BY MOUTH TWICE DAILY AS NEEDED FOR ANXIETY  . Fluticasone-Umeclidin-Vilant (TRELEGY ELLIPTA) 100-62.5-25 MCG/INH AEPB Inhale 1 puff into the lungs daily.  Marland Kitchen guaiFENesin (ROBITUSSIN) 100 MG/5ML liquid Take 20 mLs (400 mg total) by mouth 2 (two) times daily. (Patient taking differently: Take 400 mg by mouth 2 (two) times daily as needed for cough or congestion. )  . Loratadine 10 MG CAPS Take 1 capsule by mouth daily. In am.  . losartan (COZAAR) 50 MG tablet TAKE ONE TABLET BY MOUTH ONCE DAILY  . Melatonin 5 MG TABS Take 5 mg by mouth at bedtime.  . metoprolol succinate (TOPROL-XL) 50 MG 24 hr tablet TAKE 1 TABLET BY MOUTH ONCE DAILY TAKE  WITH  OR  IMMEDIATELY  FOLLOWING  A  MEAL  . Multiple Vitamin (MULTIVITAMIN) capsule  Take 1 capsule by mouth daily.  . ondansetron (ZOFRAN) 4 MG tablet Take 1 tablet (4 mg total) by mouth every 6 (six) hours as needed for nausea or vomiting.  . VENTOLIN HFA 108 (90 Base) MCG/ACT inhaler INHALE 1 TO 2 PUFFS BY MOUTH EVERY 4 HOURS AS  NEEDED FOR WHEEZING OR  SHORTNESS  OF  BREATH  . fluticasone (FLONASE) 50 MCG/ACT nasal spray Place 2 sprays into both nostrils daily.  Marland Kitchen levofloxacin (LEVAQUIN) 500 MG tablet Take 1 tablet (500 mg total) by mouth daily. (Patient not taking: Reported on 10-12-2017)  . predniSONE (DELTASONE) 10 MG tablet TAKE ONE TABLET BY MOUTH ONCE DAILY WITH BREAKFAST (Patient not taking: Reported on 2017/10/12)  . predniSONE (DELTASONE) 10 MG tablet TAKE 1 TABLET BY MOUTH ONCE DAILY WITH  BREAKFAST (Patient not taking: Reported on 10-12-17)  . predniSONE (DELTASONE) 20 MG tablet Take 1 tablet (20 mg total) by mouth daily. (Patient not taking: Reported on 12-Oct-2017)  . Respiratory Therapy Supplies (FLUTTER) DEVI Use as directed  . sertraline (ZOLOFT) 100 MG tablet TAKE 1 & 1/2 (ONE & ONE-HALF) TABLETS BY MOUTH ONCE DAILY (Patient not taking: Reported on 12-Oct-2017)    FAMILY HISTORY:  Her indicated that the status of her mother is unknown. She indicated that the status of her father is unknown.   SOCIAL HISTORY: She  reports that she quit smoking about 3 years ago. Her smoking use included cigarettes. She quit after 40.00 years of use. She has never used smokeless tobacco. She reports that she does not drink alcohol or use drugs.  REVIEW OF SYSTEMS:   As above. Otherwise, a detailed ROS is [place]  SUBJECTIVE:    VITAL SIGNS: BP 115/87   Pulse (!) 111   Temp 97.7 F (36.5 C) (Axillary)   Resp 17   Ht  (1.676 m)   Wt 190 lb (86.2 kg)   SpO2 (!) 89%   BMI 30.67 kg/m   HEMODYNAMICS:    VENTILATOR SETTINGS:    INTAKE / OUTPUT: No intake/output data recorded.  PHYSICAL EXAMINATION: General: Severe respiratory distress, cognition intact Neuro: CNs intact, motor and sensory intact HEENT: Cushingoid facies, NCAT, sclerae white Cardiovascular: tachy, regular, no murmur noted Lungs: Scattered coarse wheezes throughout with generally diminished breath sounds Abdomen: Centripetal obesity, soft,  NT, diminished BS Extremities: Sarcopenia, no edema, warm  Skin: Few ecchymoses  LABS:  BMET Recent Labs  Lab October 12, 2017 0826  NA 135  K 4.1  CL 98*  CO2 25  BUN 6  CREATININE 0.67  GLUCOSE 250*    Electrolytes Recent Labs  Lab 2017/10/12 0826  CALCIUM 9.3    CBC Recent Labs  Lab 10-12-17 0826  WBC 18.1*  HGB 13.4  HCT 40.3  PLT 401    Coag's Recent Labs  Lab October 12, 2017 1012  APTT 30  INR 0.92    Sepsis Markers No results for input(s): LATICACIDVEN, PROCALCITON, O2SATVEN in the last 168 hours.  ABG Recent Labs  Lab 12-Oct-2017 0826  PHART 7.29*  PCO2ART 63*  PO2ART 82*    Liver Enzymes Recent Labs  Lab 12-Oct-2017 0826  AST 40  ALT 27  ALKPHOS 47  BILITOT 0.7  ALBUMIN 4.0    Cardiac Enzymes Recent Labs  Lab 10/12/17 0826 10/12/17 1012  TROPONINI 4.13* 2.99*    Glucose Recent Labs  Lab 10/12/2017 1041  GLUCAP 152*    CXR: Changes of COPD with no acute findings    ASSESSMENT: End-stage COPD Acute COPD exacerbation  Purulent bronchitis Acute/chronic hypoxemic respiratory failure Elevated troponin I, NSTEMI -not presently a candidate for aggressive evaluation  Previously seen by Dr. Mariah Milling, [facility] Sinus tachycardia ASA allergy DNR/DNI - re-confirmed this admission    PLAN:  PULM PRN BiPAP Supplemental O2 to maintain SPO2 >88% Systemic steroids-dose adjusted Nebulized steroids and bronchodilators Empiric antibiotics - levofloxacin Lorazepam as needed for anxiety Morphine as needed for severe respiratory distress  CARDIAC Full dose enoxaparin for ACS - continue full dose for 48-72 hours Initiate clopidogrel (given aspirin allergy) SL NTG as needed for chest pain Continue scheduled metoprolol at previous dose IV metoprolol as needed to maintain HR <115/min  I have canceled cardiology consultation for now as there are no current options for intervention Cardiology consultation can be considered later in the hospitalization  depending on her course If her respiratory status stabilizes, we might consider echocardiogram as well   Husband updated at bedside.  Daughter updated over phone.   CCM time: 45 mins The above time includes time spent in consultation with patient and/or family members and reviewing care plan on multidisciplinary rounds  Billy Fischer, MD PCCM service Mobile [phone] Pager [phone] 2017/10/10, 4:30 PM

## 2017-09-15 NOTE — Consult Note (Addendum)
ANTICOAGULATION CONSULT NOTE - Initial Consult  Pharmacy Consult for Enoxaparin Indication: chest pain/ACS  Allergies  Allergen Reactions  . Aspirin Swelling  . Penicillins Hives    Has patient had a PCN reaction causing immediate rash, facial/tongue/throat swelling, SOB or lightheadedness with hypotension: Yes Has patient had a PCN reaction causing severe rash involving mucus membranes or skin necrosis: No Has patient had a PCN reaction that required hospitalization: No Has patient had a PCN reaction occurring within the last 10 years: No If all of the above answers are "NO", then may proceed with Cephalosporin use.  . Levaquin [Levofloxacin] Nausea Only    Patient Measurements: Height:  (167.6 cm) Weight: 190 lb (86.2 kg) IBW/kg (Calculated) : 59.3 Heparin Dosing Weight: 77 kg  Vital Signs: Temp: 98.7 F (37.1 C) (05/16 0819) Temp Source: Oral (05/16 0819) BP: 102/90 (05/16 0900) Pulse Rate: 131 (05/16 0900)  Labs: Recent Labs    2017-09-19 0826  HGB 13.4  HCT 40.3  PLT 401  CREATININE 0.67  TROPONINI 4.13*    Estimated Creatinine Clearance: 82.8 mL/min (by C-G formula based on SCr of 0.67 mg/dL).   Medical History: Past Medical History:  Diagnosis Date  . Anxiety   . Bronchitis   . COPD (chronic obstructive pulmonary disease) (HCC)    exacerbation 03/28/15.  Marland Kitchen Cough    chronic  . Depression   . Edema   . Environmental allergies   . GERD (gastroesophageal reflux disease)   . Headache   . Heart murmur   . Hyperlipidemia   . Hypertension   . Lower extremity edema   . Murmur   . Occasional tremors    fropm Prednisone  . Orthopnea   . Orthopnea   . Oxygen deficiency    2l/ continuous  . Pneumonia   . Respiratory disorder    failure 01/25/15  . Sleep apnea   . Tobacco abuse   . Tremors of nervous system    from prednisone  . Tremors of nervous system   . Wheezing   . Wheezing    Assessment: Pharmacy initially consulted for heparin dosing  and monitoring in 62yo female with Respiratory distress, possible NSTEMI and Troponin of 4.13.   Pharmacy now consulted for enoxaparin treatment dosing to reduce lab draws for patient. Dr. Sung Amabile would like to treat with full dose anticoagulation for 2 days, then begin regular DVT prophylaxis with enox 40.   Goal of Therapy:  Heparin level 0.3-0.7 units/ml Monitor platelets by anticoagulation protocol: Yes   Plan:  Heparin drip discontinued.  Will start enoxaparin /kg (85kg) every 12 hours for 4 doses.   Enoxaparin  to begin 5/18 at 2200.  Will monitor CBC daily per protocol.   Gardner Candle, PharmD, BCPS Clinical Pharmacist Sep 19, 2017 9:30 AM
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
## 2017-09-15 NOTE — Progress Notes (Signed)
Called and spoke with [facility] in lab regarding MRSA PCR swab. Diane [name] stated that he wasn't sure why it hasn't been resulted yet, as it had been marked received at 1600. Transferred to [facility] and she stated that it was running and had estimated time to results of 16 minutes. Will continue checking for results.

## 2017-09-15 NOTE — Plan of Care (Signed)
Pt admitted to ICU due to needing BiPap. Pt off BiPap to [facility] this afternoon but frequently feels like her breathing is tight. Getting PRN morphine and ativan as needed for anxiety and air hunger. Pt's husband at bedside and reports that family is coming in from [facility] and [place].

## 2017-09-15 NOTE — Progress Notes (Signed)
Chaplain was paged @ 13:01 for prayer with the patient. During conversation, the patient is spiritually ready, but still clings to the hope of one more chance to live. Though she holds out hope, the patient is aware of the severity of her condition. Chaplain prayed both discursively and energetically. The family will gather later today. Chaplain spoke to the patient's nurse to share the current situation.Diane [name]

## 2017-09-15 NOTE — ED Triage Notes (Signed)
Pt in via [facility] from home with acute onset respiratory distress upon waking.  Pt took Albuterol treatment at home without any relief.  Pt wears 2L nasal cannula at baseline w/ hx COPD.  Pt on CPAP upon arrival, increase work of breathing noted.  EDP at bedside.

## 2017-09-16 LAB — BASIC METABOLIC PANEL
ANION GAP: 10 (ref 5–15)
BUN: 15 mg/dL (ref 6–20)
CALCIUM: 9.2 mg/dL (ref 8.9–10.3)
CO2: 27 mmol/L (ref 22–32)
CREATININE: 0.98 mg/dL (ref 0.44–1.00)
Chloride: 98 mmol/L — ABNORMAL LOW (ref 101–111)
GFR calc non Af Amer: >60 mL/min (ref >60)
Glucose, Bld: 148 mg/dL — ABNORMAL HIGH (ref 65–99)
Potassium: 4.8 mmol/L (ref 3.5–5.1)
Sodium: 135 mmol/L (ref 135–145)

## 2017-09-16 LAB — CBC
HCT: 38 % (ref 35.0–47.0)
Hemoglobin: 12.7 g/dL (ref 12.0–16.0)
MCH: 29.8 pg (ref 26.0–34.0)
MCHC: 33.5 g/dL (ref 32.0–36.0)
MCV: 88.9 fL (ref 80.0–100.0)
PLATELETS: 336 10*3/uL (ref 150–440)
RBC: 4.28 MIL/uL (ref 3.80–5.20)
RDW: 14.6 % — ABNORMAL HIGH (ref 11.5–14.5)
WBC: 13.1 10*3/uL — ABNORMAL HIGH (ref 3.6–11.0)

## 2017-09-16 LAB — TROPONIN I: TROPONIN I: 2.1 ng/mL — AB (ref <0.03)

## 2017-09-16 LAB — PROCALCITONIN: PROCALCITONIN: 0.17 ng/mL

## 2017-09-16 MED ORDER — LACTATED RINGERS IV BOLUS
250.0000 mL | INTRAVENOUS | Status: AC
  Administered 2017-09-16 (×2): 250 mL via INTRAVENOUS

## 2017-09-16 MED ORDER — SODIUM CHLORIDE 0.9 % IV BOLUS
1000.0000 mL | Freq: Once | INTRAVENOUS | Status: AC
  Administered 2017-09-16: 1000 mL via INTRAVENOUS

## 2017-09-16 NOTE — Progress Notes (Signed)
[facility] [place] at [facility]   PATIENT NAME: Diane [name]    MR#:  [id number]  DATE OF BIRTH:  04/08/1956  SUBJECTIVE:   Patient mains on BiPAP.  She still appears moderately distressed due to COPD  REVIEW OF SYSTEMS:    Review of Systems  Constitutional: Negative for fever, chills weight loss HENT: Negative for ear pain, nosebleeds, congestion, facial swelling, rhinorrhea, neck pain, neck stiffness and ear discharge.   Respiratory: Positive for cough, shortness of breath, wheezing  Cardiovascular: Negative for chest pain, palpitations and leg swelling.  Gastrointestinal: Negative for heartburn, abdominal pain, vomiting, diarrhea or consitpation Genitourinary: Negative for dysuria, urgency, frequency, hematuria Musculoskeletal: Negative for back pain or joint pain Neurological: Negative for dizziness, seizures, syncope, focal weakness,  numbness and headaches.  Hematological: Does not bruise/bleed easily.  Psychiatric/Behavioral: Negative for hallucinations, confusion, dysphoric mood    Tolerating Diet: yes      DRUG ALLERGIES:   Allergies  Allergen Reactions  . Aspirin Swelling  . Penicillins Hives    Has patient had a PCN reaction causing immediate rash, facial/tongue/throat swelling, SOB or lightheadedness with hypotension: Yes Has patient had a PCN reaction causing severe rash involving mucus membranes or skin necrosis: No Has patient had a PCN reaction that required hospitalization: No Has patient had a PCN reaction occurring within the last 10 years: No If all of the above answers are "NO", then may proceed with Cephalosporin use.  . Levaquin [Levofloxacin] Nausea Only    VITALS:  Blood pressure (!) 119/91, pulse (!) 127, temperature 98.2 F (36.8 C), temperature source Oral, resp. rate 18, height  (1.676 m), weight 86.2 kg (190 lb), SpO2 96 %.  PHYSICAL EXAMINATION:  Constitutional: Appears well-developed and well-nourished.   Moderate respiratory distress. HENT: Normocephalic. Marland Kitchen Oropharynx is clear and moist.  Eyes: Conjunctivae and EOM are normal. PERRLA, no scleral icterus.  Neck: Normal ROM. Neck supple. No JVD. No tracheal deviation. CVS: Tachycardia, S1/S2 +, no murmurs, no gallops, no carotid bruit.  Pulmonary: Wheeze respiratory effort with bilateral wheezing using accessory muscles Abdominal: Soft. BS +,  no distension, tenderness, rebound or guarding.  Musculoskeletal: Normal range of motion. No edema and no tenderness.  Neuro: Alert. CN 2-12 grossly intact. No focal deficits. Skin: Skin is warm and dry. No rash noted. Psychiatric: Normal mood and affect.      LABORATORY PANEL:   CBC Recent Labs  Lab 09/16/17 0443  WBC 13.1*  HGB 12.7  HCT 38.0  PLT 336   ------------------------------------------------------------------------------------------------------------------  Chemistries  Recent Labs  Lab 10-05-2017 0826 09/16/17 0443  NA 135 135  K 4.1 4.8  CL 98* 98*  CO2 25 27  GLUCOSE 250* 148*  BUN 6 15  CREATININE 0.67 0.98  CALCIUM 9.3 9.2  AST 40  --   ALT 27  --   ALKPHOS 47  --   BILITOT 0.7  --    ------------------------------------------------------------------------------------------------------------------  Cardiac Enzymes Recent Labs  Lab Oct 05, 2017 0826 10-05-17 1012 09/16/17 0443  TROPONINI 4.13* 2.99* 2.10*   ------------------------------------------------------------------------------------------------------------------  RADIOLOGY:  Dg Chest Port 1 View  Result Date: 10-05-2017 CLINICAL DATA:  Respiratory distress syndrome. EXAM: PORTABLE CHEST 1 VIEW COMPARISON:  01/08/2016. FINDINGS: Mediastinum and hilar structures normal. Lungs are clear. No pleural effusion or pneumothorax. Heart size normal. Deformity of the right clavicle again noted consist with old fracture. No acute bony abnormality. IMPRESSION: No acute cardiopulmonary disease. Electronically  Signed   By: Maisie Fus  Register  On: 13-Oct-2017 08:51     ASSESSMENT AND PLAN:   62 year old female with history of chronic hypoxic respiratory failure due to severe COPD who presents with shortness of breath and respiratory distress.  1.  Acute on chronic hypoxic respiratory failure due to COPD exacerbation with acute bronchitis Patient currently on BiPAP and will remain on BiPAP for now Continue management with IV steroids, Levaquin duo nebs and inhalers Continue PRN morphine for respiratory distress  2.  Elevated troponin/non-STEMI: Troponin max 4.13 troponins are trending down Continue full dose Lovenox for 24 more hours Overall prognosis is poor from pulmonary standpoint. Recommendations for Plavix, Toprol statin and Lovenox for total 40 to 72 hours per Dr. Kirke Corin. No aggressive management. 3.  Essential hypertension: Continue losartan and metoprolol        Management plans discussed with the patient and she is in agreement.  CODE STATUS: DNR  TOTAL TIME TAKING CARE OF THIS PATIENT: 27 minutes.     POSSIBLE D/C ??, DEPENDING ON CLINICAL CONDITION.   Dodger Sinning M.D on 09/16/2017 at 11:20 AM  Between 7am to 6pm - Pager - [phone] After 6pm go to [email or web address] - password [email or web address]  [facility] [place] [facility]  Office  [phone]  CC: Primary care physician; Sherlene Shams, MD  Note: This dictation was prepared with Dragon dictation along with smaller phrase technology. Any transcriptional errors that result from this process are unintentional.

## 2017-09-16 NOTE — Progress Notes (Signed)
Consulted for  possible non-ST elevation myocardial infarction with troponin of 4 on presentation which has been trending down. Discussed the case yesterday with Dr. Sung Amabile and Dr. confronted today. The patient has end-stage COPD with multiple hospitalizations for exacerbations.  Overall prognosis is poor from pulmonary standpoint alone. I have recommended aspirin or Plavix and unfractionated heparin for 48 hours.  Continue treatment of underlying COPD.  No further cardiac work-up.

## 2017-09-16 NOTE — Progress Notes (Signed)
Follow up - Critical Care Medicine Note  Patient Details:    Diane [name] is an 62 y.o. female.with Severe COPD and debilitation due to exertional dyspnea. She is on chronic prednisone therapy, maximal bronchodilation, presented to the outpatient pulmonary office, complains of green sputum, started on Levaquin, presented to the emergency department with progressive respiratory failure requiring BiPAP.    Lines, Airways, Drains: External Urinary Catheter (Active)  Collection Container Dedicated Suction Canister 09/16/2017  2:00 AM  Securement Method Other (Comment) 09/16/2017  2:00 AM  Intervention Equipment Changed 09/19/2017  6:16 PM  Output (mL) 0 mL 09/16/2017 12:00 AM    Anti-infectives:  Anti-infectives (From admission, onward)   Start     Dose/Rate Route Frequency Ordered Stop   09/18/2017 1700  levofloxacin (LEVAQUIN) IVPB 500 mg     500 mg 100 mL/hr over 60 Minutes Intravenous Every 24 hours 09/28/2017 1639     09/18/2017 1200  doxycycline (VIBRA-TABS) tablet 100 mg  Status:  Discontinued     100 mg Oral Every 12 hours 09/20/2017 1041 09/30/2017 1116      Microbiology: Results for orders placed or performed during the hospital encounter of 09/20/2017  MRSA PCR Screening     Status: None   Collection Time: 09/20/2017  3:02 PM  Result Value Ref Range Status   MRSA by PCR NEGATIVE NEGATIVE Final    Comment:        The GeneXpert MRSA Assay (FDA approved for NASAL specimens only), is one component of a comprehensive MRSA colonization surveillance program. It is not intended to diagnose MRSA infection nor to guide or monitor treatment for MRSA infections. Performed at [facility], [address], [place], [place] [address]    Studies: Dg Chest [facility] 1 View  Result Date: 09/01/2017 CLINICAL DATA:  Respiratory distress syndrome. EXAM: PORTABLE CHEST 1 VIEW COMPARISON:  01/08/2016. FINDINGS: Mediastinum and hilar structures normal. Lungs are clear. No pleural effusion or  pneumothorax. Heart size normal. Deformity of the right clavicle again noted consist with old fracture. No acute bony abnormality. IMPRESSION: No acute cardiopulmonary disease. Electronically Signed   By: Maisie Fus  Register   On: 09/20/2017 08:51    Consults: Treatment Team:  Merwyn Katos, MD   Subjective:    Overnight Issues: patient remains on BiPAP, states that she is feeling somewhat better although still is labored  Objective:  Vital signs for last 24 hours: Temp:  [97.7 F (36.5 C)-98.2 F (36.8 C)] 98.2 F (36.8 C) (05/17 0200) Pulse Rate:  [107-138] 120 (05/17 0600) Resp:  [13-25] 15 (05/17 0600) BP: (102-135)/(72-106) 124/94 (05/17 0600) SpO2:  [87 %-100 %] 93 % (05/17 0600) FiO2 (%):  [35 %] 35 % (05/17 0045)  Hemodynamic parameters for last 24 hours:    Intake/Output from previous day: 05/16 0701 - 05/17 0700 In: 1228.3 [I.V.:928.3; IV Piggyback:300] Out: 150 [Urine:150]  Intake/Output this shift: No intake/output data recorded.  Vent settings for last 24 hours: FiO2 (%):  [35 %] 35 %  Physical Exam:  Vital signs: Please see the above listed vital signs HEENT: Limited oral exam, patient is on BiPAP, accessory muscle utilization noted Cardiovascular: Tachycardia, diminished heart sounds Pulmonary: Scattered wheezes appreciated throughout with decreased aeration Abdominal: Positive bowel sounds, soft exam, central obesity Extremities: No clubbing cyanosis or edema Cutaneous: Ecchymosis noted Neurologic: No focal deficits appreciated  Assessment/Plan:   Patient with end-stage COPD with acute exacerbation.arterial blood gas reveals acute on chronic hypercapnic respiratory failure Placed on albuterol, Atrovent, Levaquin,  Solu-Medrol, BiPAP. Chest x-ray reveals hyperinflation without clear pneumonia noted. We'll try to wean off of BiPAP as tolerated today  EKG shows diffuse T-wave inversions, on full anticoagulation, peak troponin of 4.13, cardiology has been  consulted  Leukocytosis noted most likely secondary to infection  Critical Care Total Time*: 35 Minutes  Ajeet Casasola 09/16/2017  *Care during the described time interval was provided by me and/or other providers on the critical care team.  I have reviewed this patient's available data, including medical history, events of note, physical examination and test results as part of my evaluation.

## 2017-09-16 NOTE — Consult Note (Signed)
ANTICOAGULATION CONSULT NOTE - Initial Consult  Pharmacy Consult for Enoxaparin Indication: chest pain/ACS  Allergies  Allergen Reactions  . Aspirin Swelling  . Penicillins Hives    Has patient had a PCN reaction causing immediate rash, facial/tongue/throat swelling, SOB or lightheadedness with hypotension: Yes Has patient had a PCN reaction causing severe rash involving mucus membranes or skin necrosis: No Has patient had a PCN reaction that required hospitalization: No Has patient had a PCN reaction occurring within the last 10 years: No If all of the above answers are "NO", then may proceed with Cephalosporin use.  . Levaquin [Levofloxacin] Nausea Only    Patient Measurements: Height:  (167.6 cm) Weight: (error in bed system- no weight available) IBW/kg (Calculated) : 59.3 Heparin Dosing Weight: 77 kg  Vital Signs: Temp: 98.2 F (36.8 C) (05/17 0200) Temp Source: Oral (05/17 0200) BP: 119/91 (05/17 1000) Pulse Rate: 127 (05/17 1000)  Labs: Recent Labs    09/04/2017 0826 09/09/2017 1012 09/16/17 0443  HGB 13.4  --  12.7  HCT 40.3  --  38.0  PLT 401  --  336  APTT  --  30  --   LABPROT  --  12.3  --   INR  --  0.92  --   CREATININE 0.67  --  0.98  TROPONINI 4.13* 2.99* 2.10*    Estimated Creatinine Clearance: 67.6 mL/min (by C-G formula based on SCr of 0.98 mg/dL).   Medical History: Past Medical History:  Diagnosis Date  . Anxiety   . Bronchitis   . COPD (chronic obstructive pulmonary disease) (HCC)    exacerbation 03/28/15.  Marland Kitchen Cough    chronic  . Depression   . Edema   . Environmental allergies   . GERD (gastroesophageal reflux disease)   . Headache   . Heart murmur   . Hyperlipidemia   . Hypertension   . Lower extremity edema   . Murmur   . Occasional tremors    fropm Prednisone  . Orthopnea   . Orthopnea   . Oxygen deficiency    2l/ continuous  . Pneumonia   . Respiratory disorder    failure 01/25/15  . Sleep apnea   . Tobacco abuse    . Tremors of nervous system    from prednisone  . Tremors of nervous system   . Wheezing   . Wheezing    Assessment: Pharmacy initially consulted for heparin dosing and monitoring in 62yo female with Respiratory distress, possible NSTEMI and Troponin of 4.13.   Pharmacy now consulted for enoxaparin treatment dosing to reduce lab draws for patient. Dr. Sung Amabile would like to treat with full dose anticoagulation for 2 days, then begin regular DVT prophylaxis with enox 40.   Goal of Therapy:  Heparin level 0.3-0.7 units/ml Monitor platelets by anticoagulation protocol: Yes   Plan:  Continue enoxaparin /kg (85kg) every 12 hours for 4 doses. Last dose 5/18 at 0000.    Enoxaparin  to begin 5/18 at 2200.  Will monitor CBC daily per protocol.   Gardner Candle, PharmD, BCPS Clinical Pharmacist 09/16/2017 11:02 AM

## 2017-09-17 LAB — BASIC METABOLIC PANEL
Anion gap: 11 (ref 5–15)
BUN: 24 mg/dL — AB (ref 6–20)
CHLORIDE: 98 mmol/L — AB (ref 101–111)
CO2: 28 mmol/L (ref 22–32)
CREATININE: 0.86 mg/dL (ref 0.44–1.00)
Calcium: 9.1 mg/dL (ref 8.9–10.3)
GFR calc Af Amer: >60 mL/min (ref >60)
GFR calc non Af Amer: >60 mL/min (ref >60)
GLUCOSE: 105 mg/dL — AB (ref 65–99)
POTASSIUM: 4.9 mmol/L (ref 3.5–5.1)
SODIUM: 137 mmol/L (ref 135–145)

## 2017-09-17 LAB — MAGNESIUM: MAGNESIUM: 2.2 mg/dL (ref 1.7–2.4)

## 2017-09-17 LAB — PROCALCITONIN: PROCALCITONIN: 0.1 ng/mL

## 2017-09-17 MED ORDER — DEXMEDETOMIDINE HCL IN NACL 400 MCG/100ML IV SOLN
0.4000 ug/kg/h | INTRAVENOUS | Status: DC
  Administered 2017-09-17: 0.4 ug/kg/h via INTRAVENOUS
  Administered 2017-09-18: 0.5 ug/kg/h via INTRAVENOUS
  Administered 2017-09-18 (×3): 1 ug/kg/h via INTRAVENOUS
  Administered 2017-09-19 (×2): 1.2 ug/kg/h via INTRAVENOUS
  Administered 2017-09-19: 0.7 ug/kg/h via INTRAVENOUS
  Filled 2017-09-17 (×8): qty 100

## 2017-09-17 MED ORDER — LORAZEPAM 2 MG/ML IJ SOLN
1.0000 mg | Freq: Once | INTRAMUSCULAR | Status: AC
  Administered 2017-09-17: 1 mg via INTRAVENOUS

## 2017-09-17 MED ORDER — SODIUM CHLORIDE 0.9 % IV BOLUS
500.0000 mL | Freq: Once | INTRAVENOUS | Status: AC
  Administered 2017-09-17: 500 mL via INTRAVENOUS

## 2017-09-17 MED ORDER — PHENYLEPHRINE HCL-NACL 10-0.9 MG/250ML-% IV SOLN
0.0000 ug/min | INTRAVENOUS | Status: DC
  Filled 2017-09-17: qty 250

## 2017-09-17 NOTE — Progress Notes (Signed)
[facility] [place] at [facility]   PATIENT NAME: Diane [name]    MR#:  [id number]  DATE OF BIRTH:  1955/12/16  SUBJECTIVE:   Patient here due to acute on chronic respiratory failure with hypoxia secondary to COPD exacerbation.  Patient has underlying end-stage COPD.  Difficult to wean off BiPAP.  Still Having significant exertional dyspnea.  REVIEW OF SYSTEMS:    Review of Systems  Constitutional: Negative for chills and fever.  HENT: Negative for congestion and tinnitus.   Eyes: Negative for blurred vision and double vision.  Respiratory: Positive for shortness of breath and wheezing. Negative for cough.   Cardiovascular: Negative for chest pain, orthopnea and PND.  Gastrointestinal: Negative for abdominal pain, diarrhea, nausea and vomiting.  Genitourinary: Negative for dysuria and hematuria.  Neurological: Negative for dizziness, sensory change and focal weakness.  All other systems reviewed and are negative.   Nutrition: NPO Tolerating Diet: no due to resp. Distress.  Tolerating PT: Await Eval.   DRUG ALLERGIES:   Allergies  Allergen Reactions  . Aspirin Swelling  . Penicillins Hives    Has patient had a PCN reaction causing immediate rash, facial/tongue/throat swelling, SOB or lightheadedness with hypotension: Yes Has patient had a PCN reaction causing severe rash involving mucus membranes or skin necrosis: No Has patient had a PCN reaction that required hospitalization: No Has patient had a PCN reaction occurring within the last 10 years: No If all of the above answers are "NO", then may proceed with Cephalosporin use.  . Levaquin [Levofloxacin] Nausea Only    VITALS:  Blood pressure 115/73, pulse (!) 105, temperature 97.6 F (36.4 C), temperature source Axillary, resp. rate 19, height  (1.676 m), weight 88.6 kg (195 lb 5.2 oz), SpO2 95 %.  PHYSICAL EXAMINATION:   Physical Exam  GENERAL:  62 y.o.-year-old patient lying in bed on Bipap  and mild Resp. Distress. Marland Kitchen  EYES: Pupils equal, round, reactive to light and accommodation. No scleral icterus. Extraocular muscles intact.  HEENT: Head atraumatic, normocephalic. Oropharynx and nasopharynx clear.  NECK:  Supple, no jugular venous distention. No thyroid enlargement, no tenderness.  LUNGS: Normal breath sounds bilaterally, diffuse insp. & exp. Wheezing, no  rales, rhonchi. No use of accessory muscles of respiration.  CARDIOVASCULAR: S1, S2 normal. No murmurs, rubs, or gallops.  ABDOMEN: Soft, nontender, nondistended. Bowel sounds present. No organomegaly or mass.  EXTREMITIES: No cyanosis, clubbing or edema b/l.    NEUROLOGIC: Cranial nerves II through XII are intact. No focal Motor or sensory deficits b/l. Globally weak.   PSYCHIATRIC: The patient is alert and oriented x 3.  SKIN: No obvious rash, lesion, or ulcer.    LABORATORY PANEL:   CBC Recent Labs  Lab 09/16/17 0443  WBC 13.1*  HGB 12.7  HCT 38.0  PLT 336   ------------------------------------------------------------------------------------------------------------------  Chemistries  Recent Labs  Lab 10/11/17 0826  09/17/17 0353  NA 135   < > 137  K 4.1   < > 4.9  CL 98*   < > 98*  CO2 25   < > 28  GLUCOSE 250*   < > 105*  BUN 6   < > 24*  CREATININE 0.67   < > 0.86  CALCIUM 9.3   < > 9.1  MG  --   --  2.2  AST 40  --   --   ALT 27  --   --   ALKPHOS 47  --   --   BILITOT  0.7  --   --    < > = values in this interval not displayed.   ------------------------------------------------------------------------------------------------------------------  Cardiac Enzymes Recent Labs  Lab 09/16/17 0443  TROPONINI 2.10*   ------------------------------------------------------------------------------------------------------------------  RADIOLOGY:  No results found.   ASSESSMENT AND PLAN:   62 year old female with end-stage COPD, anxiety/depression, hypertension, hyperlipidemia, obstructive  sleep apnea who presented to the hospital due to shortness of breath and noted to be in acute on chronic respiratory failure with hypoxia.  1.  Acute on chronic respiratory failure with hypoxia- secondary to severe COPD with exacerbation. - Patient has significant shortness of breath on minimal exertion.  Difficult to wean off BiPAP. -Continue IV steroids, scheduled duo nebs, Pulmicort nebs and as needed albuterol nebulizers. -Continue IV morphine for air hunger.  2.  Severe end-stage COPD with exacerbation-continue treatment as mentioned above. -Continue empiric Levaquin.  3.  Non-ST elevation MI-patient has been ruled in by cardiac markers. -Given her severe COPD and high risk for pulmonary complications.  Continue aspirin, Plavix, Lovenox for 48 hours and medical management as per cardiology.  No plans for acute intervention. -Continue Toprol losartan  4.  Essential hypertension-continue Toprol, losartan.  5.  Anxiety/depression-continue Ativan, Zoloft.    All the records are reviewed and case discussed with Care Management/Social Worker. Management plans discussed with the patient, family and they are in agreement.  CODE STATUS: DNR  DVT Prophylaxis: Lovenox  TOTAL TIME TAKING CARE OF THIS PATIENT: 30 minutes.   POSSIBLE D/C IN 3-4 DAYS, DEPENDING ON CLINICAL CONDITION.   Houston Siren M.D on 09/17/2017 at 2:53 PM  Between 7am to 6pm - Pager - [phone]  After 6pm go to [email or web address] - Social research officer, government  [employer] [facility]  Office  [phone]  CC: Primary care physician; Sherlene Shams, MD

## 2017-09-17 NOTE — Progress Notes (Addendum)
Patient placed on HFNC due to increased WOB. Tol well at this time. Bipap left at beside for use if patient needs it.

## 2017-09-17 NOTE — Progress Notes (Signed)
Follow up - Critical Care Medicine Note  Patient Details:    Diane [name] is an 62 y.o. female.with Severe COPD and debilitation due to exertional dyspnea. She is on chronic prednisone therapy, maximal bronchodilation, presented to the outpatient pulmonary office, complains of green sputum, started on Levaquin, presented to the emergency department with progressive respiratory failure requiring BiPAP.    Lines, Airways, Drains: External Urinary Catheter (Active)  Collection Container Dedicated Suction Canister 09/16/2017  2:00 AM  Securement Method Other (Comment) 09/16/2017  2:00 AM  Intervention Equipment Changed Oct 14, 2017  6:16 PM  Output (mL) 0 mL 09/16/2017 12:00 AM    Anti-infectives:  Anti-infectives (From admission, onward)   Start     Dose/Rate Route Frequency Ordered Stop   14-Oct-2017 1700  levofloxacin (LEVAQUIN) IVPB 500 mg     500 mg 100 mL/hr over 60 Minutes Intravenous Every 24 hours 10/14/17 1639     10/14/17 1200  doxycycline (VIBRA-TABS) tablet 100 mg  Status:  Discontinued     100 mg Oral Every 12 hours Oct 14, 2017 1041 Oct 14, 2017 1116      Microbiology: Results for orders placed or performed during the hospital encounter of 10/14/17  MRSA PCR Screening     Status: None   Collection Time: Oct 14, 2017  3:02 PM  Result Value Ref Range Status   MRSA by PCR NEGATIVE NEGATIVE Final    Comment:        The GeneXpert MRSA Assay (FDA approved for NASAL specimens only), is one component of a comprehensive MRSA colonization surveillance program. It is not intended to diagnose MRSA infection nor to guide or monitor treatment for MRSA infections. Performed at [facility], [address], [place], [place] [address]    Studies: Dg Chest [facility] 1 View  Result Date: 10-14-2017 CLINICAL DATA:  Respiratory distress syndrome. EXAM: PORTABLE CHEST 1 VIEW COMPARISON:  01/08/2016. FINDINGS: Mediastinum and hilar structures normal. Lungs are clear. No pleural effusion or  pneumothorax. Heart size normal. Deformity of the right clavicle again noted consist with old fracture. No acute bony abnormality. IMPRESSION: No acute cardiopulmonary disease. Electronically Signed   By: Maisie Fus  Register   On: Oct 14, 2017 08:51    Consults: Treatment Team:  Merwyn Katos, MD   Subjective:    Overnight Issues: patient has been on and off of BiPAP last 24 hours, also has had some episode of confusion. He states she is resting comfortably this morning  Objective:  Vital signs for last 24 hours: Temp:  [97.5 F (36.4 C)-98.5 F (36.9 C)] 97.7 F (36.5 C) (05/18 0739) Pulse Rate:  [86-127] 101 (05/18 0700) Resp:  [7-20] 16 (05/18 0700) BP: (85-136)/(68-112) 121/97 (05/18 0700) SpO2:  [87 %-100 %] 92 % (05/18 0747) FiO2 (%):  [35 %-45 %] 45 % (05/17 2000) Weight:  [195 lb 5.2 oz (88.6 kg)] 195 lb 5.2 oz (88.6 kg) (05/18 0408)  Hemodynamic parameters for last 24 hours:    Intake/Output from previous day: 05/17 0701 - 05/18 0700 In: 1195.8 [I.V.:1095.8; IV Piggyback:100] Out: 750 [Urine:750]  Intake/Output this shift: No intake/output data recorded.  Vent settings for last 24 hours: FiO2 (%):  [35 %-45 %] 45 %  Physical Exam:  Vital signs: Please see the above listed vital signs HEENT: Limited oral exam, patient is on BiPAP, accessory muscle utilization noted Cardiovascular: Tachycardia, diminished heart sounds Pulmonary: Scattered wheezes appreciated throughout with decreased aeration Abdominal: Positive bowel sounds, soft exam, central obesity Extremities: No clubbing cyanosis or edema Cutaneous: Ecchymosis noted Neurologic:  No focal deficits appreciated  Assessment/Plan:   Patient with end-stage COPD with acute exacerbation.arterial blood gas reveals acute on chronic hypercapnic respiratory failure Placed on albuterol, Atrovent, Levaquin, Solu-Medrol, BiPAP. Chest x-ray reveals hyperinflation without clear pneumonia noted. We'll try to wean off of BiPAP  as tolerated today  EKG shows diffuse T-wave inversions, on full anticoagulation, peak troponin of 4.13,has come down to 2.1 limited therapeutics secondary to severe COPD. Discussed with cardiology, patient is allergic to aspirin, will complete 48 hours of anticoagulation, continue beta blocker and statin  Leukocytosis noted most likely secondary to infection  Critical Care Total Time*: 35 Minutes  Seamus Warehime 09/17/2017  *Care during the described time interval was provided by me and/or other providers on the critical care team.  I have reviewed this patient's available data, including medical history, events of note, physical examination and test results as part of my evaluation. Patient ID: [name], female   DOB: 02-18-1956, 62 y.o.   MRN: [id number]

## 2017-09-17 NOTE — Progress Notes (Signed)
Dr. Lonn Georgia notified that pt has only had of urine output this shift. Order received to give pt bolus of NS over an hour.

## 2017-09-17 NOTE — Progress Notes (Signed)
Bipap on standby at this time and patient is on a 4l Norco.

## 2017-09-17 NOTE — Plan of Care (Signed)
Pt on and off BiPap at beginning of shift. Was started on HFNC this afternoon and tolerating well. Has needed frequent PRN ativan and morphine to help with anxiety and dyspnea. Pt became very tearful this afternoon and asked everyone that came into the room if she was going to be trached or not, and kept expressing that she didn't want to get a trach. Urine output decreased with only 100 out so far this shift.

## 2017-09-18 MED ORDER — FAMOTIDINE IN NACL 20-0.9 MG/50ML-% IV SOLN
20.0000 mg | Freq: Two times a day (BID) | INTRAVENOUS | Status: DC
  Administered 2017-09-18 – 2017-09-19 (×3): 20 mg via INTRAVENOUS
  Filled 2017-09-18 (×3): qty 50

## 2017-09-18 NOTE — Progress Notes (Signed)
Follow up - Critical Care Medicine Note  Patient Details:    Diane [name] is an 62 y.o. female.with Severe COPD and debilitation due to exertional dyspnea. She is on chronic prednisone therapy, maximal bronchodilation, presented to the outpatient pulmonary office, complains of green sputum, started on Levaquin, presented to the emergency department with progressive respiratory failure requiring BiPAP.    Lines, Airways, Drains: External Urinary Catheter (Active)  Collection Container Dedicated Suction Canister 09/16/2017  2:00 AM  Securement Method Other (Comment) 09/16/2017  2:00 AM  Intervention Equipment Changed Oct 01, 2017  6:16 PM  Output (mL) 0 mL 09/16/2017 12:00 AM    Anti-infectives:  Anti-infectives (From admission, onward)   Start     Dose/Rate Route Frequency Ordered Stop   10-01-17 1700  levofloxacin (LEVAQUIN) IVPB 500 mg     500 mg 100 mL/hr over 60 Minutes Intravenous Every 24 hours 10-01-2017 1639     10-01-2017 1200  doxycycline (VIBRA-TABS) tablet 100 mg  Status:  Discontinued     100 mg Oral Every 12 hours 01-Oct-2017 1041 October 01, 2017 1116      Microbiology: Results for orders placed or performed during the hospital encounter of 01-Oct-2017  MRSA PCR Screening     Status: None   Collection Time: 10-01-2017  3:02 PM  Result Value Ref Range Status   MRSA by PCR NEGATIVE NEGATIVE Final    Comment:        The GeneXpert MRSA Assay (FDA approved for NASAL specimens only), is one component of a comprehensive MRSA colonization surveillance program. It is not intended to diagnose MRSA infection nor to guide or monitor treatment for MRSA infections. Performed at [facility], [address], [place], [place] [address]    Studies: Dg Chest [facility] 1 View  Result Date: 2017-10-01 CLINICAL DATA:  Respiratory distress syndrome. EXAM: PORTABLE CHEST 1 VIEW COMPARISON:  01/08/2016. FINDINGS: Mediastinum and hilar structures normal. Lungs are clear. No pleural effusion or  pneumothorax. Heart size normal. Deformity of the right clavicle again noted consist with old fracture. No acute bony abnormality. IMPRESSION: No acute cardiopulmonary disease. Electronically Signed   By: Maisie Fus  Register   On: 10-01-2017 08:51    Consults: Treatment Team:  Merwyn Katos, MD   Subjective:    Overnight Issues:patient is presently on heated high flow with improved shortness of breath. She has had some confusion yesterday although appears more lucent this morning  Objective:  Vital signs for last 24 hours: Temp:  [97.6 F (36.4 C)-98.5 F (36.9 C)] 98.1 F (36.7 C) (05/19 0600) Pulse Rate:  [68-120] 85 (05/19 0600) Resp:  [10-20] 16 (05/19 0600) BP: (88-169)/(55-137) 108/83 (05/19 0600) SpO2:  [90 %-100 %] 98 % (05/19 0600) FiO2 (%):  [37 %-50 %] 50 % (05/19 0600) Weight:  [195 lb 5.2 oz (88.6 kg)] 195 lb 5.2 oz (88.6 kg) (05/18 2000)  Hemodynamic parameters for last 24 hours:    Intake/Output from previous day: 05/18 0701 - 05/19 0700 In: 2762.9 [I.V.:2762.9] Out: 905 [Urine:905]  Intake/Output this shift: No intake/output data recorded.  Vent settings for last 24 hours: FiO2 (%):  [37 %-50 %] 50 %  Physical Exam:  Vital signs: Please see the above listed vital signs HEENT: Limited oral exam, patient is on BiPAP, accessory muscle utilization noted Cardiovascular: Tachycardia, diminished heart sounds Pulmonary: Scattered wheezes appreciated throughout with decreased aeration Abdominal: Positive bowel sounds, soft exam, central obesity Extremities: No clubbing cyanosis or edema Cutaneous: Ecchymosis noted Neurologic: No focal deficits appreciated  Assessment/Plan:   Patient with end-stage COPD with acute exacerbation.arterial blood gas reveals acute on chronic hypercapnic respiratory failure Placed on albuterol, Atrovent, Levaquin, Solu-Medrol, BiPAP. Chest x-ray reveals hyperinflation without clear pneumonia noted. We'll try to wean off of BiPAP as  tolerated today  EKG shows diffuse T-wave inversions, on full anticoagulation, peak troponin of 4.13,has come down to 2.1 limited therapeutics secondary to severe COPD. Discussed with cardiology, patient is allergic to aspirin, will complete 48 hours of anticoagulation, continue beta blocker and statin  Leukocytosis noted most likely secondary to infection    Diane [name] 09/18/2017  *Care during the described time interval was provided by me and/or other providers on the critical care team.  I have reviewed this patient's available data, including medical history, events of note, physical examination and test results as part of my evaluation. Patient ID: Diane [name], female   DOB: May 13, 1955, 62 y.o.   MRN: [id number] Patient ID: Diane [name], female   DOB: 12-Jun-1955, 62 y.o.   MRN: [id number]

## 2017-09-18 NOTE — Progress Notes (Signed)
Decreased fio2 to 40%

## 2017-09-18 NOTE — Progress Notes (Signed)
Dwecreased fio2 to 45%

## 2017-09-18 NOTE — Progress Notes (Signed)
Patient has been more agitated with increased WOB this afternoon, RN placed patient back on bipap due to this. Patient still had increased WOB when RT entered room to give her afternoon neb treatment. Due to the increased WOB, this RT increased the IPAP to 14 and decreased the EPAP to 6 to provide more pressure support. Patient tol well at this time, RN aware of change.

## 2017-09-18 NOTE — Progress Notes (Signed)
[employer] - [place] at [facility]   PATIENT NAME: Diane [name]    MR#:  [id number]  DATE OF BIRTH:  [birth year]  SUBJECTIVE:   Patient weaned off BiPAP and placed on high focal nasal cannula yesterday but presently having significant dyspnea with shortness of breath and wheezing.  Given some IV morphine and placed on Precedex drip.  REVIEW OF SYSTEMS:    Review of Systems  Constitutional: Negative for chills and fever.  HENT: Negative for congestion and tinnitus.   Eyes: Negative for blurred vision and double vision.  Respiratory: Positive for shortness of breath and wheezing. Negative for cough.   Cardiovascular: Negative for chest pain, orthopnea and PND.  Gastrointestinal: Negative for abdominal pain, diarrhea, nausea and vomiting.  Genitourinary: Negative for dysuria and hematuria.  Neurological: Negative for dizziness, sensory change and focal weakness.  All other systems reviewed and are negative.   Nutrition: NPO Tolerating Diet: no due to resp. Distress.  Tolerating PT: Await Eval.   DRUG ALLERGIES:   Allergies  Allergen Reactions  . Aspirin Swelling  . Penicillins Hives    Has patient had a PCN reaction causing immediate rash, facial/tongue/throat swelling, SOB or lightheadedness with hypotension: Yes Has patient had a PCN reaction causing severe rash involving mucus membranes or skin necrosis: No Has patient had a PCN reaction that required hospitalization: No Has patient had a PCN reaction occurring within the last 10 years: No If all of the above answers are "NO", then may proceed with Cephalosporin use.  . Levaquin [Levofloxacin] Nausea Only    VITALS:  Blood pressure 112/72, pulse 68, temperature 99.5 F (37.5 C), temperature source Axillary, resp. rate 14, height  (1.676 m), weight 88.6 kg (195 lb 5.2 oz), SpO2 98 %.  PHYSICAL EXAMINATION:   Physical Exam  GENERAL:  62 y.o.-year-old patient lying in bed on high flow nasal cannula  in moderate respiratory distress.  EYES: Pupils equal, round, reactive to light and accommodation. No scleral icterus. Extraocular muscles intact.  HEENT: Head atraumatic, normocephalic. Oropharynx and nasopharynx clear.  NECK:  Supple, no jugular venous distention. No thyroid enlargement, no tenderness.  LUNGS: Normal breath sounds bilaterally, diffuse insp. & exp. Wheezing, no  rales, rhonchi. No use of accessory muscles of respiration.  CARDIOVASCULAR: S1, S2 normal. No murmurs, rubs, or gallops.  ABDOMEN: Soft, nontender, nondistended. Bowel sounds present. No organomegaly or mass.  EXTREMITIES: No cyanosis, clubbing or edema b/l.    NEUROLOGIC: Cranial nerves II through XII are intact. No focal Motor or sensory deficits b/l. Globally weak.   PSYCHIATRIC: The patient is alert and oriented x 3.  Anxious SKIN: No obvious rash, lesion, or ulcer.    LABORATORY PANEL:   CBC Recent Labs  Lab 09/16/17 0443  WBC 13.1*  HGB 12.7  HCT 38.0  PLT 336   ------------------------------------------------------------------------------------------------------------------  Chemistries  Recent Labs  Lab 09/19/2017 0826  09/17/17 0353  NA 135   < > 137  K 4.1   < > 4.9  CL 98*   < > 98*  CO2 25   < > 28  GLUCOSE 250*   < > 105*  BUN 6   < > 24*  CREATININE 0.67   < > 0.86  CALCIUM 9.3   < > 9.1  MG  --   --  2.2  AST 40  --   --   ALT 27  --   --   ALKPHOS 47  --   --  BILITOT 0.7  --   --    < > = values in this interval not displayed.   ------------------------------------------------------------------------------------------------------------------  Cardiac Enzymes Recent Labs  Lab 09/16/17 0443  TROPONINI 2.10*   ------------------------------------------------------------------------------------------------------------------  RADIOLOGY:  No results found.   ASSESSMENT AND PLAN:   62 year old female with end-stage COPD, anxiety/depression, hypertension, hyperlipidemia,  obstructive sleep apnea who presented to the hospital due to shortness of breath and noted to be in acute on chronic respiratory failure with hypoxia.  1.  Acute on chronic respiratory failure with hypoxia- secondary to severe COPD with exacerbation. - She was weaned off BiPAP and placed on high flow nasal cannula but having significant dyspnea and bronchospasm presently.  Received IV morphine and placed on Precedex drip would likely to be placed on BiPAP given the shortness of breath. -Continue IV steroids, scheduled duo nebs, Pulmicort nebs and as needed albuterol nebulizers. -Continue IV morphine for air hunger.  2.  Severe end-stage COPD with exacerbation-continue treatment as mentioned above. -Continue empiric Levaquin. Consider Hospice services for Symptom management.   3.  Non-ST elevation MI-patient has been ruled in by cardiac markers. -Given her severe COPD and high risk for pulmonary complications.  Continue aspirin, Plavix, Lovenox for 48 hours and medical management as per cardiology.  No plans for acute intervention. -Continue Toprol, losartan  4.  Essential hypertension-continue Toprol, losartan.  5.  Anxiety/depression-continue Ativan, Zoloft.    All the records are reviewed and case discussed with Care Management/Social Worker. Management plans discussed with the patient, family and they are in agreement.  CODE STATUS: DNR  DVT Prophylaxis: Lovenox  TOTAL TIME TAKING CARE OF THIS PATIENT: 30 minutes.   POSSIBLE D/C IN 3-4 DAYS, DEPENDING ON CLINICAL CONDITION.   Houston Siren M.D on 09/18/2017 at 2:07 PM  Between 7am to 6pm - Pager - [phone]  After 6pm go to [email or web address] - Social research officer, government  [employer] [facility]  Office  [phone]  CC: Primary care physician; Sherlene Shams, MD

## 2017-09-18 NOTE — Plan of Care (Signed)
Patient remains on HFNC this shift.  Started on low dose Precedex. Tolerated well.  Morphine given x 1 for severe dyspnea.  Grandson at bedside. Will continue to monitor.

## 2017-09-19 DIAGNOSIS — J9621 Acute and chronic respiratory failure with hypoxia: Secondary | ICD-10-CM

## 2017-09-19 DIAGNOSIS — Z7189 Other specified counseling: Secondary | ICD-10-CM

## 2017-09-19 DIAGNOSIS — Z515 Encounter for palliative care: Secondary | ICD-10-CM

## 2017-09-19 DIAGNOSIS — J9601 Acute respiratory failure with hypoxia: Secondary | ICD-10-CM

## 2017-09-19 DIAGNOSIS — J9622 Acute and chronic respiratory failure with hypercapnia: Secondary | ICD-10-CM

## 2017-09-19 LAB — CBC
HCT: 35.3 % (ref 35.0–47.0)
HEMOGLOBIN: 11.9 g/dL — AB (ref 12.0–16.0)
MCH: 30.1 pg (ref 26.0–34.0)
MCHC: 33.7 g/dL (ref 32.0–36.0)
MCV: 89.4 fL (ref 80.0–100.0)
Platelets: 309 10*3/uL (ref 150–440)
RBC: 3.94 MIL/uL (ref 3.80–5.20)
RDW: 14.4 % (ref 11.5–14.5)
WBC: 13.9 10*3/uL — ABNORMAL HIGH (ref 3.6–11.0)

## 2017-09-19 LAB — COMPREHENSIVE METABOLIC PANEL
ALK PHOS: 31 U/L — AB (ref 38–126)
ALT: 56 U/L — AB (ref 14–54)
ANION GAP: 13 (ref 5–15)
AST: 74 U/L — ABNORMAL HIGH (ref 15–41)
Albumin: 3.9 g/dL (ref 3.5–5.0)
BILIRUBIN TOTAL: 1.2 mg/dL (ref 0.3–1.2)
BUN: 26 mg/dL — ABNORMAL HIGH (ref 6–20)
CHLORIDE: 98 mmol/L — AB (ref 101–111)
CO2: 26 mmol/L (ref 22–32)
Calcium: 8.8 mg/dL — ABNORMAL LOW (ref 8.9–10.3)
Creatinine, Ser: 0.82 mg/dL (ref 0.44–1.00)
GFR calc Af Amer: >60 mL/min (ref >60)
GLUCOSE: 121 mg/dL — AB (ref 65–99)
Potassium: 4.6 mmol/L (ref 3.5–5.1)
SODIUM: 137 mmol/L (ref 135–145)
Total Protein: 6.3 g/dL — ABNORMAL LOW (ref 6.5–8.1)

## 2017-09-19 LAB — MAGNESIUM: MAGNESIUM: 2.3 mg/dL (ref 1.7–2.4)

## 2017-09-19 LAB — PHOSPHORUS: Phosphorus: 2.9 mg/dL (ref 2.5–4.6)

## 2017-09-19 MED ORDER — HALOPERIDOL LACTATE 5 MG/ML IJ SOLN: 0.5000 mg | INTRAMUSCULAR | Status: DC | PRN

## 2017-09-19 MED ORDER — MORPHINE SULFATE (PF) 2 MG/ML IV SOLN
2.0000 mg | INTRAVENOUS | Status: DC | PRN
  Administered 2017-09-19 (×2): 2 mg via INTRAVENOUS
  Filled 2017-09-19 (×2): qty 1

## 2017-09-19 MED ORDER — MORPHINE 100MG IN NS 100ML (1MG/ML) PREMIX INFUSION
1.0000 mg/h | INTRAVENOUS | Status: DC
  Administered 2017-09-19: 1 mg/h via INTRAVENOUS
  Administered 2017-09-21: 3 mg/h via INTRAVENOUS
  Filled 2017-09-19 (×2): qty 100

## 2017-09-19 MED ORDER — GLYCOPYRROLATE 0.2 MG/ML IJ SOLN
0.2000 mg | INTRAMUSCULAR | Status: DC | PRN
  Filled 2017-09-19: qty 1

## 2017-09-19 MED ORDER — GLYCOPYRROLATE 1 MG PO TABS
1.0000 mg | ORAL_TABLET | ORAL | Status: DC | PRN
  Filled 2017-09-19: qty 1

## 2017-09-19 MED ORDER — IPRATROPIUM-ALBUTEROL 0.5-2.5 (3) MG/3ML IN SOLN
3.0000 mL | RESPIRATORY_TRACT | Status: DC
  Administered 2017-09-19 – 2017-09-20 (×7): 3 mL via RESPIRATORY_TRACT
  Filled 2017-09-19 (×7): qty 3

## 2017-09-19 MED ORDER — METHYLPREDNISOLONE SODIUM SUCC 40 MG IJ SOLR
40.0000 mg | Freq: Two times a day (BID) | INTRAMUSCULAR | Status: DC
  Administered 2017-09-19: 40 mg via INTRAVENOUS
  Filled 2017-09-19: qty 1

## 2017-09-19 MED ORDER — LORAZEPAM 2 MG/ML IJ SOLN
2.0000 mg | Freq: Four times a day (QID) | INTRAMUSCULAR | Status: DC
  Administered 2017-09-19 – 2017-09-21 (×7): 2 mg via INTRAVENOUS
  Filled 2017-09-19 (×7): qty 1

## 2017-09-19 MED ORDER — BUDESONIDE 0.5 MG/2ML IN SUSP
0.5000 mg | Freq: Two times a day (BID) | RESPIRATORY_TRACT | Status: DC
  Administered 2017-09-19 – 2017-09-20 (×2): 0.5 mg via RESPIRATORY_TRACT
  Filled 2017-09-19 (×2): qty 2

## 2017-09-19 MED ORDER — MORPHINE BOLUS VIA INFUSION
2.0000 mg | INTRAVENOUS | Status: DC | PRN
  Administered 2017-09-20 (×2): 2 mg via INTRAVENOUS
  Filled 2017-09-19: qty 2

## 2017-09-19 NOTE — Progress Notes (Signed)
[employer] Case # [id number] [phone] Son is in route from [place] to the states.  

## 2017-09-19 NOTE — Plan of Care (Signed)
Patient on BiPap this shift.  Patient remained awake screaming out names of people. Family not present at this time. Remains on precedex. Morphine and Ativan given with no obvious effect.  Patient pulled out IV during shift. Able to get IV team to place another. No significant changes this shift. Will continue to monitor.

## 2017-09-19 NOTE — Progress Notes (Signed)
Chaplain encountered the patient's sister-in-law, Leanne, who was visibly upset about the patient's condition and an earlier conversation with the patient's husband. Chaplain provided active listening, intercessory prayer, and a ministry of present for both Leanne and the patient.

## 2017-09-19 NOTE — Progress Notes (Signed)
[employer] - [place] at [facility]   PATIENT NAME: Diane [name]    MR#:  [id number]  DATE OF BIRTH:  Apr 29, 1956  SUBJECTIVE:   She still having significant shortness of breath and now on Precedex drip and currently encephalopathic.  Remains on BiPAP.  Due to her severe COPD family is considering hospice/palliative care/comfort care  REVIEW OF SYSTEMS:    Review of Systems  Constitutional: Negative for chills and fever.  HENT: Negative for congestion and tinnitus.   Eyes: Negative for blurred vision and double vision.  Respiratory: Positive for shortness of breath and wheezing. Negative for cough.   Cardiovascular: Negative for chest pain, orthopnea and PND.  Gastrointestinal: Negative for abdominal pain, diarrhea, nausea and vomiting.  Genitourinary: Negative for dysuria and hematuria.  Neurological: Negative for dizziness, sensory change and focal weakness.  All other systems reviewed and are negative.   Nutrition: NPO Tolerating Diet: No  Tolerating PT: Await Eval.   DRUG ALLERGIES:   Allergies  Allergen Reactions  . Aspirin Swelling  . Penicillins Hives    Has patient had a PCN reaction causing immediate rash, facial/tongue/throat swelling, SOB or lightheadedness with hypotension: Yes Has patient had a PCN reaction causing severe rash involving mucus membranes or skin necrosis: No Has patient had a PCN reaction that required hospitalization: No Has patient had a PCN reaction occurring within the last 10 years: No If all of the above answers are "NO", then may proceed with Cephalosporin use.  . Levaquin [Levofloxacin] Nausea Only    VITALS:  Blood pressure 111/77, pulse 85, temperature 98.4 F (36.9 C), temperature source Oral, resp. rate 13, height  (1.676 m), weight 89.6 kg (197 lb 8.5 oz), SpO2 98 %.  PHYSICAL EXAMINATION:   Physical Exam  GENERAL:  62 y.o.-year-old patient lying in bed lethargic/encephalopathic on Bipap.   EYES:  No  scleral icterus. HEENT: Head atraumatic, normocephalic. Oropharynx and nasopharynx clear.  NECK:  Supple, no jugular venous distention. No thyroid enlargement, no tenderness.  LUNGS: Good A/E b/l, diffuse insp. & exp. Wheezing, no  rales, rhonchi. No use of accessory muscles of respiration.  CARDIOVASCULAR: S1, S2 normal. No murmurs, rubs, or gallops.  ABDOMEN: Soft, nontender, nondistended. Bowel sounds present. No organomegaly or mass.  EXTREMITIES: No cyanosis, clubbing or edema b/l.    NEUROLOGIC: Cranial nerves II through XII are intact. No focal Motor or sensory deficits b/l. Globally weak.   PSYCHIATRIC: The patient is alert and oriented x 3.  Anxious SKIN: No obvious rash, lesion, or ulcer.    LABORATORY PANEL:   CBC Recent Labs  Lab 09/19/17 0449  WBC 13.9*  HGB 11.9*  HCT 35.3  PLT 309   ------------------------------------------------------------------------------------------------------------------  Chemistries  Recent Labs  Lab 09/19/17 0449  NA 137  K 4.6  CL 98*  CO2 26  GLUCOSE 121*  BUN 26*  CREATININE 0.82  CALCIUM 8.8*  MG 2.3  AST 74*  ALT 56*  ALKPHOS 31*  BILITOT 1.2   ------------------------------------------------------------------------------------------------------------------  Cardiac Enzymes Recent Labs  Lab 09/16/17 0443  TROPONINI 2.10*   ------------------------------------------------------------------------------------------------------------------  RADIOLOGY:  No results found.   ASSESSMENT AND PLAN:   63 year old female with end-stage COPD, anxiety/depression, hypertension, hyperlipidemia, obstructive sleep apnea who presented to the hospital due to shortness of breath and noted to be in acute on chronic respiratory failure with hypoxia.  1.  Acute on chronic respiratory failure with hypoxia- secondary to severe COPD with exacerbation. -Patient was having significant shortness of breath  yesterday and therefore placed  back on BiPAP.  Remains on Precedex drip and IV morphine for air hunger. -Continue IV steroids, scheduled duo nebs, Pulmicort nebs and as needed albuterol nebulizers.  2.  Severe end-stage COPD with exacerbation-continue treatment as mentioned above. -Continue empiric Levaquin.   3.  Non-ST elevation MI-patient has been ruled in by cardiac markers. -Given her severe COPD and high risk for pulmonary complications.  Continue aspirin, Plavix, Lovenox for 48 hours and medical management as per cardiology.  No plans for acute intervention. -Continue Toprol, losartan  4.  Essential hypertension-continue Toprol, losartan.  5.  Anxiety/depression-continue Ativan, Zoloft.  Patient has a poor prognosis given her advanced COPD and significant dyspnea.  Palliative care consult obtained to discuss goals of care.  Family is leaning towards comfort care.  All the records are reviewed and case discussed with Care Management/Social Worker. Management plans discussed with the patient, family and they are in agreement.  CODE STATUS: DNR  DVT Prophylaxis: Lovenox  TOTAL TIME TAKING CARE OF THIS PATIENT: 25 minutes.   POSSIBLE D/C unclear, DEPENDING ON CLINICAL CONDITION.   Houston Siren M.D on 09/19/2017 at 2:39 PM  Between 7am to 6pm - Pager - [phone]  After 6pm go to [email or web address] - Social research officer, government  [employer] [facility]  Office  [phone]  CC: Primary care physician; Sherlene Shams, MD

## 2017-09-19 NOTE — Progress Notes (Signed)
Critical Care Medicine Note  Patient Details:    Diane [name] is an 62 y.o. female.with Severe COPD and debilitation due to exertional dyspnea. She is on chronic prednisone therapy, maximal bronchodilation, presented to the outpatient pulmonary office, complains of green sputum, started on Levaquin, presented to the emergency department with progressive respiratory failure requiring BiPAP.   CC  follow up resp failure  HPI Severe COPD exacerbation On biPAP Severe resp distress Prognosis is very poor Remains critically ill   Lines, Airways, Drains: External Urinary Catheter (Active)  Collection Container Dedicated Suction Canister 09/16/2017  2:00 AM  Securement Method Other (Comment) 09/16/2017  2:00 AM  Intervention Equipment Changed 09/24/17  6:16 PM  Output (mL) 0 mL 09/16/2017 12:00 AM    Anti-infectives:  Anti-infectives (From admission, onward)   Start     Dose/Rate Route Frequency Ordered Stop   09/24/17 1700  levofloxacin (LEVAQUIN) IVPB 500 mg     500 mg 100 mL/hr over 60 Minutes Intravenous Every 24 hours 24-Sep-2017 1639     2017-09-24 1200  doxycycline (VIBRA-TABS) tablet 100 mg  Status:  Discontinued     100 mg Oral Every 12 hours 09-24-17 1041 2017/09/24 1116      Microbiology: Results for orders placed or performed during the hospital encounter of Sep 24, 2017  MRSA PCR Screening     Status: None   Collection Time: 2017-09-24  3:02 PM  Result Value Ref Range Status   MRSA by PCR NEGATIVE NEGATIVE Final    Comment:        The GeneXpert MRSA Assay (FDA approved for NASAL specimens only), is one component of a comprehensive MRSA colonization surveillance program. It is not intended to diagnose MRSA infection nor to guide or monitor treatment for MRSA infections. Performed at [facility], [address] [place], [place] [address]    Studies: Dg Chest [facility] 1 View  Result Date: Sep 24, 2017 CLINICAL DATA:  Respiratory distress syndrome. EXAM: PORTABLE  CHEST 1 VIEW COMPARISON:  01/08/2016. FINDINGS: Mediastinum and hilar structures normal. Lungs are clear. No pleural effusion or pneumothorax. Heart size normal. Deformity of the right clavicle again noted consist with old fracture. No acute bony abnormality. IMPRESSION: No acute cardiopulmonary disease. Electronically Signed   By: Maisie Fus  Register   On: 09-24-17 08:51    Consults: Treatment Team:  Merwyn Katos, MD   Subjective:    Overnight Issues:patient is presently on heated high flow with improved shortness of breath. She has had some confusion yesterday although appears more lucent this morning  Objective:  Vital signs for last 24 hours: Temp:  [98.4 F (36.9 C)-98.7 F (37.1 C)] 98.7 F (37.1 C) (05/19 2000) Pulse Rate:  [68-123] 123 (05/20 0645) Resp:  [12-30] 23 (05/20 0645) BP: (102-153)/(68-94) 138/76 (05/19 2000) SpO2:  [92 %-100 %] 94 % (05/20 0645) FiO2 (%):  [35 %-45 %] 35 % (05/19 2000) Weight:  [197 lb 8.5 oz (89.6 kg)] 197 lb 8.5 oz (89.6 kg) (05/20 0645)  Hemodynamic parameters for last 24 hours:    Intake/Output from previous day: 05/19 0701 - 05/20 0700 In: 2373.5 [I.V.:2123.5; IV Piggyback:250] Out: 1080 [Urine:1080]  Intake/Output this shift: No intake/output data recorded.  Vent settings for last 24 hours: FiO2 (%):  [35 %-45 %] 35 %  Physical Exam:  Vital signs: Please see the above listed vital signs HEENT: Limited oral exam, patient is on BiPAP, accessory muscle utilization noted Cardiovascular: Tachycardia, diminished heart sounds Pulmonary: Scattered wheezes appreciated throughout with decreased aeration  Abdominal: Positive bowel sounds, soft exam, central obesity Extremities: No clubbing cyanosis or edema Cutaneous: Ecchymosis noted Neurologic: No focal deficits appreciated  Assessment/Plan:   62 yo morbidly obese white female with severe resp failure from severe end stage COPD exacerbation on biPAP Prognosis is very poor, patient  is DNR/DNI  Patient with end-stage COPD with acute exacerbation.arterial blood gas reveals acute on chronic hypercapnic respiratory failure Placed on albuterol, Atrovent, Levaquin, Solu-Medrol, BiPAP. Chest x-ray reveals hyperinflation without clear pneumonia noted. We'll try to wean off of BiPAP as tolerated today  EKG shows diffuse T-wave inversions, on full anticoagulation, peak troponin of 4.13,has come down to 2.1 limited therapeutics secondary to severe COPD. Discussed with cardiology, patient is allergic to aspirin, will complete 48 hours of anticoagulation, continue beta blocker and statin  1.IV steroids 2.neb therapy 3.biPAP therapy  Will place palliative care consult for assessment for hospice and comfort care   Critical Care Time devoted to patient care services described in this note is 36 minutes.   Overall, patient is critically ill, prognosis is guarded.  Patient with Multiorgan failure and at high risk for cardiac arrest and death.    Lucie Leather, M.D.  [facility]  Medical Director [facility] Medical Director [facility] Cardio-Pulmonary Department

## 2017-09-19 NOTE — Consult Note (Signed)
Consultation Note Date: 09/19/2017   Patient Name: Diane [name]  DOB: [birth year]  MRN: [id number]  Age / Sex: 62 y.o., female  PCP: Crecencio Mc, MD Referring Physician: Henreitta Leber, MD  Reason for Consultation: Establishing goals of care  HPI/Patient Profile: 62 y.o. female  with past medical history of end stage COPD (on home O2), recurrent pneumonia, anxiety, depression, GERD, HTN admitted on 09/12/2017 with respiratory distress at home. Workup reveals COPD exacerbation and NSTEMI. She has required Bipap during admission. She was DNR status on admission. Per pulmonology note she is very well known to them outpatient and "she has very severe COPD and debilitation at baseline". During this admission she has continued to be distressed, continues on Bipap without weaning, requiring morphine and sedation with precedex. Palliative medicine is consulted for [place] and discussion of possible EOL and comfort care.   Clinical Assessment and Goals of Care:  I have reviewed medical records including EPIC notes, labs and imaging, received report from Dr. Mortimer Fries and patient's bedside RN, assessed the patient and then met at the bedside along with patient's spouse- Cecilie Lowers to discuss diagnosis prognosis, GOC, EOL wishes, disposition and options.  The patient is oriented to person and place, but not situation. She is hallucinating and not able to participate in [place] discussion.   I introduced Palliative Medicine as specialized medical care for people living with serious illness. It focuses on providing relief from the symptoms and stress of a serious illness. The goal is to improve quality of life for both the patient and the family.  We discussed a brief life review of the patient. She is from [place]. She and Cecilie Lowers have been married for 13 years. She has 2 daughters and a son. They were here to visit this weekend. She used  to enjoy boating and being out in the sun.   As far as functional and nutritional status- there has been significant decline especially over the last few years. She was hospitalized in 2016 for COPD exacerbation- requiring intubation and subsequent long term rehab and weaning at [facility]. After that there was long term rehab at [facility]. Since then she has not regained functional status. Her husband notes she has not been able to enjoy the boat or fishing in 3 years. The last few months she has gotten out of the bed only for MD appointments or the bathroom. He has noticed a marked decrease in her appetite as well.     We discussed their current illness and what it means in the larger context of their on-going co-morbidities.  Natural disease trajectory and expectations at EOL were discussed. Cecilie Lowers states that he is aware that "she is at the end". He says that she knows it also. She was buying Christmas presents and writing letters to everyone before this admission. She had said to him several times that she wasn't sure "how much more she could take".   I attempted to elicit values and goals of care important to the  patient.  I was able to ask the patient if she had any worries- she said she was worried about her kids and her husband, and she was worried about leaving them. I asked her if she was bothered by the mask (Bipap) and she stated no, then she asked the RN at bedside if she could take the mask off. I attempted to discuss [place] directly with Francely, but she was unable to engage- she became confused and anxious when [place] were discussed. She did ask the RN if the mask would make her "go to sleep". Cecilie Lowers states that Kierstyn is fearful that someone is going to give her something that is going to hasten her death.   The difference between aggressive medical intervention and comfort care was explained to [place] considered in light of the patient's goals of care.   Cecilie Lowers states that  comfort care would be the best path for Shareese. For now he requests to continue Bipap- I think this is reasonable given her anxiety and that when she is awake she states she wants to keep it on. He agrees to start morphine infusion for comfort, along with other comfort measures. He does not want to escalate care beyond current interventions and agrees to deescalate some medications that are not affecting her long term outcome. I also discussed her status with Sanaa's daughter- Minette Brine- who is in [place]. Minette Brine is going to come to [place].   Primary Decision Maker NEXT OF KIN- her husband- Cecilie Lowers    SUMMARY OF RECOMMENDATIONS -Begin transition to comfort care -Continue IV fluids, steroids -D/C oral medications not intended for comfort -Do not escalate care- no IV pressors -Start morphine continuous infusion 23m/hr (based on 187mtotal last 24 hour dosing requirements) with 79m46moluses q15m379mrn -D/C precedex -Start scheduled lorazepam  79mg 6mrs for anxiety -Haldol .5mg I30m4hrs prn agitation or delirium -Robinul .79mcg q87m prn   Code Status/Advance Care Planning:  DNR   Palliative Prophylaxis:   Delirium Protocol and frequent assessment for anxiety and air hunger  Additional Recommendations (Limitations, Scope, Preferences):  Minimize Medications and No Lab Draws   Prognosis:    Hours - Days  Discharge Planning: Anticipated Hospital Death  Primary Diagnoses: Present on Admission: . COPD exacerbation (HCC)   [place] reviewed the medical record, interviewed the patient and family, and examined the patient. The following aspects are pertinent.  Past Medical History:  Diagnosis Date  . Anxiety   . Bronchitis   . COPD (chronic obstructive pulmonary disease) (HCC)   [place]  . Cough[place]land Kitchen   chronic  . Depression   . Edema   . Environmental allergies   . GERD (gastroesophageal reflux disease)   . Headache   . Heart murmur   . Hyperlipidemia   .  Hypertension   . Lower extremity edema   . Murmur   . Occasional tremors    fropm Prednisone  . Orthopnea   . Orthopnea   . Oxygen deficiency    2l/ continuous  . Pneumonia   . Respiratory disorder    failure 01/25/15  . Sleep apnea   . Tobacco abuse   . Tremors of nervous system    from prednisone  . Tremors of nervous system   . Wheezing   . Wheezing    Social History   Socioeconomic History  . Marital status: Married    Spouse name: craig  . Number of children: Not on file  . Years of education:  Not on file  . Highest education level: Not on file  Occupational History    Employer: [employer]  Social Needs  . Financial resource strain: Not on file  . Food insecurity:    Worry: Not on file    Inability: Not on file  . Transportation needs:    Medical: Not on file    Non-medical: Not on file  Tobacco Use  . Smoking status: Former Smoker    Years: 40.00    Types: Cigarettes    Last attempt to quit: 11/19/2013    Years since quitting: 3.8  . Smokeless tobacco: Never Used  Substance and Sexual Activity  . Alcohol use: No    Alcohol/week: 0.0 oz  . Drug use: No  . Sexual activity: Not on file  Lifestyle  . Physical activity:    Days per week: Not on file    Minutes per session: Not on file  . Stress: Not on file  Relationships  . Social connections:    Talks on phone: Not on file    Gets together: Not on file    Attends religious service: Not on file    Active member of club or organization: Not on file    Attends meetings of clubs or organizations: Not on file    Relationship status: Not on file  Other Topics Concern  . Not on file  Social History Narrative  . Not on file   Family History  Problem Relation Age of Onset  . Hypertension Mother   . Hypertension Father   . Stroke Father    Scheduled Meds: . atorvastatin  40 mg Oral q1800  . budesonide (PULMICORT) nebulizer solution  0.5 mg Nebulization BID  . cholecalciferol  2,000 Units Oral Daily    . clopidogrel  75 mg Oral Daily  . enoxaparin (LOVENOX) injection  40 mg Subcutaneous Q24H  . ipratropium-albuterol  3 mL Nebulization Q4H  . losartan  50 mg Oral Daily  . methylPREDNISolone (SOLU-MEDROL) injection  40 mg Intravenous Q12H  . metoprolol succinate  50 mg Oral Daily  . multivitamin with minerals  1 tablet Oral Daily  . sertraline  25 mg Oral Daily   Continuous Infusions: . dexmedetomidine (PRECEDEX) IV infusion 0.8 mcg/kg/hr (09/19/17 1236)  . famotidine (PEPCID) IV Stopped (09/19/17 0945)  . lactated ringers 100 mL/hr at 09/19/17 0433  . levofloxacin (LEVAQUIN) IV Stopped (09/18/17 1828)   PRN Meds:.acetaminophen **OR** acetaminophen, albuterol, bisacodyl, LORazepam, metoprolol tartrate, morphine injection, nitroGLYCERIN, [DISCONTINUED] ondansetron **OR** ondansetron (ZOFRAN) IV, senna-docusate Medications Prior to Admission:  Prior to Admission medications   Medication Sig Start Date End Date Taking? Authorizing Provider  albuterol (PROVENTIL) (2.5 MG/3ML) 0.083% nebulizer solution USE 1 VIAL IN NEBULIZER EVERY 4 HOURS AS NEEDED 07/20/17  Yes Wilhelmina Mcardle, MD  ALPRAZolam Duanne Moron) 0.5 MG tablet Take 1 tablet (0.5 mg total) by mouth 2 (two) times daily as needed for anxiety or sleep. 09/07/17 09/07/18 Yes Wilhelmina Mcardle, MD  Cholecalciferol (VITAMIN D-3 PO) Take 2,000 Units by mouth daily.   Yes [provider]  clonazePAM (KLONOPIN) 0.5 MG tablet TAKE 1 TABLET BY MOUTH TWICE DAILY AS NEEDED FOR ANXIETY 09/02/17  Yes Crecencio Mc, MD  Fluticasone-Umeclidin-Vilant (TRELEGY ELLIPTA) 100-62.5-25 MCG/INH AEPB Inhale 1 puff into the lungs daily. 06/29/17  Yes Wilhelmina Mcardle, MD  guaiFENesin (ROBITUSSIN) 100 MG/5ML liquid Take 20 mLs (400 mg total) by mouth 2 (two) times daily. Patient taking differently: Take 400 mg by mouth 2 (two)  times daily as needed for cough or congestion.  06/29/17  Yes Wilhelmina Mcardle, MD  Loratadine 10 MG CAPS Take 1 capsule by mouth daily. In  am.   Yes [provider]  losartan (COZAAR) 50 MG tablet TAKE ONE TABLET BY MOUTH ONCE DAILY 10/04/16  Yes Crecencio Mc, MD  Melatonin 5 MG TABS Take 5 mg by mouth at bedtime.   Yes [provider]  metoprolol succinate (TOPROL-XL) 50 MG 24 hr tablet TAKE 1 TABLET BY MOUTH ONCE DAILY TAKE  WITH  OR  IMMEDIATELY  FOLLOWING  A  MEAL 07/25/17  Yes Crecencio Mc, MD  Multiple Vitamin (MULTIVITAMIN) capsule Take 1 capsule by mouth daily.   Yes [provider]  ondansetron (ZOFRAN) 4 MG tablet Take 1 tablet (4 mg total) by mouth every 6 (six) hours as needed for nausea or vomiting. 09/14/17  Yes Wilhelmina Mcardle, MD  VENTOLIN HFA 108 (90 Base) MCG/ACT inhaler INHALE 1 TO 2 PUFFS BY MOUTH EVERY 4 HOURS AS NEEDED FOR WHEEZING OR  SHORTNESS  OF  BREATH 12/28/16  Yes Wilhelmina Mcardle, MD  fluticasone (FLONASE) 50 MCG/ACT nasal spray Place 2 sprays into both nostrils daily. 05/07/16 08/16/17  Wilhelmina Mcardle, MD  levofloxacin (LEVAQUIN) 500 MG tablet Take 1 tablet (500 mg total) by mouth daily. Patient not taking: Reported on 09/01/2017 09/14/17   Wilhelmina Mcardle, MD  predniSONE (DELTASONE) 10 MG tablet TAKE ONE TABLET BY MOUTH ONCE DAILY WITH BREAKFAST Patient not taking: Reported on 09/08/2017 07/15/16   Wilhelmina Mcardle, MD  predniSONE (DELTASONE) 10 MG tablet TAKE 1 TABLET BY MOUTH ONCE DAILY WITH  BREAKFAST Patient not taking: Reported on 09/18/2017 06/22/17   Wilhelmina Mcardle, MD  predniSONE (DELTASONE) 20 MG tablet Take 1 tablet (20 mg total) by mouth daily. Patient not taking: Reported on 09/28/2017 07/26/17 07/26/18  Flora Lipps, MD  Respiratory Therapy Supplies (FLUTTER) DEVI Use as directed 03/29/16   Wilhelmina Mcardle, MD  sertraline (ZOLOFT) 100 MG tablet TAKE 1 & 1/2 (ONE & ONE-HALF) TABLETS BY MOUTH ONCE DAILY Patient not taking: Reported on 08/31/2017 06/16/17   Crecencio Mc, MD   Allergies  Allergen Reactions  . Aspirin Swelling  . Penicillins Hives    Has patient had  a PCN reaction causing immediate rash, facial/tongue/throat swelling, SOB or lightheadedness with hypotension: Yes Has patient had a PCN reaction causing severe rash involving mucus membranes or skin necrosis: No Has patient had a PCN reaction that required hospitalization: No Has patient had a PCN reaction occurring within the last 10 years: No If all of the above answers are "NO", then may proceed with Cephalosporin use.  . Levaquin [Levofloxacin] Nausea Only   Review of Systems  Unable to perform ROS: Acuity of condition    Physical Exam  Constitutional: She appears well-developed and well-nourished.  HENT:  Head: Normocephalic and atraumatic.  Cardiovascular: Normal rate and regular rhythm.  Pulmonary/Chest:  On bipap, sounds diminished  Abdominal: Soft.  Musculoskeletal: She exhibits no edema.  Neurological:  Oriented to person, place, not oriented to situation, memory not intact  Skin: There is pallor.  Psychiatric:  anxious  Nursing note and vitals reviewed.   Vital Signs: BP 111/77   Pulse 85   Temp 98.4 F (36.9 C) (Oral)   Resp 13   Ht _0  (1.676 m)   Wt 89.6 kg (197 lb 8.5 oz)   SpO2 98%   BMI 31.88 kg/m  Pain Scale: 0-10 POSS *See Group Information*: 1-Acceptable,Awake and alert Pain Score: Asleep   SpO2: SpO2: 98 % O2 Device:SpO2: 98 % O2 Flow Rate: .O2 Flow Rate (L/min): 50 L/min  IO: Intake/output summary:   Intake/Output Summary (Last 24 hours) at 09/19/2017 1248 Last data filed at 09/19/2017 0500 Gross per 24 hour  Intake 2373.47 ml  Output 1080 ml  Net 1293.47 ml    LBM: Last BM Date: (UTA) Baseline Weight: Weight: 86.2 kg (190 lb) Most recent weight: Weight: 89.6 kg (197 lb 8.5 oz)     Palliative Assessment/Data: PPS: 10%     Thank you for this consult. Palliative medicine will continue to follow and assist as needed.   Time In: 1230 Time Out: 1430 Time Total: 120 mins Greater than 50%  of this time was spent counseling and  coordinating care related to the above assessment and plan.  Signed by: Mariana Kaufman, AGNP-C Palliative Medicine    Please contact Palliative Medicine Team phone at [phone] for questions and concerns.  For individual provider: See Shea Evans

## 2017-09-19 NOTE — Progress Notes (Signed)
Palliative care team met with Husband, patient is suffering and struggling to breathe.  At this time, we will start process and initiate comfort care measures. Will order Morphine infusion and Ativan, but will continue biPAP. We will NOT start vasopressors  Appreciate Palliative care assessment plan    Corrin Parker, M.D.  [facility]  Medical Director [place] Director [facility] Cardio-Pulmonary Department

## 2017-09-20 DIAGNOSIS — J449 Chronic obstructive pulmonary disease, unspecified: Secondary | ICD-10-CM

## 2017-09-20 DIAGNOSIS — J9622 Acute and chronic respiratory failure with hypercapnia: Secondary | ICD-10-CM

## 2017-09-20 DIAGNOSIS — I214 Non-ST elevation (NSTEMI) myocardial infarction: Secondary | ICD-10-CM

## 2017-09-20 DIAGNOSIS — J9621 Acute and chronic respiratory failure with hypoxia: Secondary | ICD-10-CM

## 2017-09-20 NOTE — Progress Notes (Signed)
Chaplain was praying silently for the patient when the patient awoke. Chaplain continuing praying for and with the patient. She requested prayer several times during the encounter, so Chaplain offered different prayers to help calm her and provide assurance. Chaplain used discursive and Scientist, clinical (histocompatibility and immunogenetics) and a ministry of presence.

## 2017-09-20 NOTE — Progress Notes (Signed)
Daily Progress Note   Patient Name: Diane [name]       Date: 09/20/2017 DOB: 11-07-1955  Age: 62 y.o. MRN#: [id number] Attending Physician: Houston Siren, MD Primary Care Physician: Sherlene Shams, MD Admit Date: 09/28/2017  Reason for Consultation/Follow-up: Psychosocial/spiritual support and Terminal Care  Subjective: [name] is awake this morning, remains on Bipap. She desats quickly even if off for only moments. She asks about taking off the mask- I explain to her that we can take the mask off, and give her medicine to keep her from feeling SOB, but her lungs will not keep her alive for long. She would like to keep the bipap on until her children arrive. She asks if she's in the same place that she was before. Per RN she becomes anxious if she is alone. Her son in enroute from [place] with assistance from [employer]. Her daughters are coming from [place] and [place]. Tasia Catchings is at bedside. Tamisha tells me she is comfortable. She does not have any worries or fears this morning. She is not in pain. Her mouth is dry.    Review of Systems  Constitutional: Positive for malaise/fatigue.  All other systems reviewed and are negative.   Length of Stay: 5  Current Medications: Scheduled Meds:  . budesonide (PULMICORT) nebulizer solution  0.5 mg Nebulization BID  . ipratropium-albuterol  3 mL Nebulization Q4H  . LORazepam  2 mg Intravenous Q6H  . sertraline  25 mg Oral Daily    Continuous Infusions: . lactated ringers Stopped (09/19/17 1900)  . morphine 1 mg/hr (09/20/17 0600)    PRN Meds: acetaminophen **OR** acetaminophen, albuterol, glycopyrrolate **OR** glycopyrrolate **OR** glycopyrrolate, haloperidol lactate, LORazepam, morphine, [DISCONTINUED] ondansetron **OR** ondansetron (ZOFRAN)  IV  Physical Exam  Constitutional: She appears well-developed and well-nourished.  Cardiovascular:  tachycardic  Pulmonary/Chest:  Sounds diminished  Abdominal: Soft.  Neurological: She is alert.  Oriented to person and place  Skin: Skin is warm and dry.  Psychiatric: She has a normal mood and affect.  Nursing note and vitals reviewed.           Vital Signs: BP 125/79   Pulse (!) 118   Temp 99.3 F (37.4 C) (Axillary)   Resp 16   Ht  (1.676 m)   Wt 89.8 kg (197 lb 15.6 oz)  SpO2 92%   BMI 31.95 kg/m  SpO2: SpO2: 92 % O2 Device: O2 Device: Bi-PAP O2 Flow Rate: O2 Flow Rate (L/min): 50 L/min  Intake/output summary:   Intake/Output Summary (Last 24 hours) at 09/20/2017 1001 Last data filed at 09/20/2017 0600 Gross per 24 hour  Intake 2194.44 ml  Output 750 ml  Net 1444.44 ml   LBM: Last BM Date: (UTA) Baseline Weight: Weight: 86.2 kg (190 lb) Most recent weight: Weight: 89.8 kg (197 lb 15.6 oz)       Palliative Assessment/Data: PPS: 10%      Patient Active Problem List   Diagnosis Date Noted  . Palliative care by specialist   . Acute on chronic respiratory failure with hypoxia and hypercapnia (HCC)   . Advanced care planning/counseling discussion   . Goals of care, counseling/discussion   . Impaired fasting glucose 11/20/2016  . COPD exacerbation (HCC) 09/27/2015  . COPD (chronic obstructive pulmonary disease) (HCC) 12/25/2014  . Cough 07/23/2014  . Chronic hypoxemic respiratory failure (HCC) 05/08/2014  . Tobacco abuse counseling 12/29/2013  . Increased urinary frequency 03/22/2013  . Cardiac murmur 01/30/2013  . GERD (gastroesophageal reflux disease) 01/15/2013  . Hypertension 08/15/2011  . Annual physical exam 08/15/2011  . GOLD GRADE D COPD   . Hyperlipidemia   . History of tobacco abuse   . Depression   . Anxiety     Palliative Care Assessment & Plan   Patient Profile:  62 y.o. female  with past medical history of end stage COPD (on  home O2), recurrent pneumonia, anxiety, depression, GERD, HTN admitted on 2017-09-23 with respiratory distress at home. Workup reveals COPD exacerbation and NSTEMI. She has required Bipap during admission. She was DNR status on admission. Per pulmonology note she is very well known to them outpatient and "she has very severe COPD and debilitation at baseline". During this admission she has continued to be distressed, continues on Bipap without weaning, requiring morphine and sedation with precedex. Palliative medicine is consulted for GOC and discussion of possible EOL and comfort care.   Assessment/Recommendations/Plan   D/C steroids as they are not providing benefit and may be increasing anxiety  Continue IV fluids until family arrives- then will d/c  Complete transition to comfort with transition off Bipap once all family has arrived  Goals of Care and Additional Recommendations:  Limitations on Scope of Treatment: No Artificial Feeding, No Diagnostics, No Hemodialysis, No IV Antibiotics and No Surgical Procedures  Code Status:  DNR  Prognosis:   < 2 weeks  Discharge Planning:  Anticipated Hospital Death  Care plan was discussed with patient's spouse, RN, and MD.   Thank you for allowing the Palliative Medicine Team to assist in the care of this patient.   Time In: 0910 Time Out: 0945 Total Time 35 mins Prolonged Time Billed no      Greater than 50%  of this time was spent counseling and coordinating care related to the above assessment and plan.  Ocie Bob, AGNP-C Palliative Medicine   Please contact Palliative Medicine Team phone at [phone] for questions and concerns.

## 2017-09-20 NOTE — Progress Notes (Signed)
Critical Care Medicine Note  Patient Details:    Diane [name] is an 62 y.o. female.with Severe COPD and debilitation due to exertional dyspnea. She is on chronic prednisone therapy, maximal bronchodilation, presented to the outpatient pulmonary office, complains of green sputum, started on Levaquin, presented to the emergency department with progressive respiratory failure requiring BiPAP.   CC  follow up resp failure  HPI Severe COPD exacerbation On biPAP Severe resp distress Prognosis is very poor Remains critically ill Placed on morphine infusion Patient is dying process   Lines, Airways, Drains: External Urinary Catheter (Active)  Collection Container Dedicated Suction Canister 09/16/2017  2:00 AM  Securement Method Other (Comment) 09/16/2017  2:00 AM  Intervention Equipment Changed 09-22-17  6:16 PM  Output (mL) 0 mL 09/16/2017 12:00 AM    Anti-infectives:  Anti-infectives (From admission, onward)   Start     Dose/Rate Route Frequency Ordered Stop   09/22/2017 1700  levofloxacin (LEVAQUIN) IVPB 500 mg  Status:  Discontinued     500 mg 100 mL/hr over 60 Minutes Intravenous Every 24 hours Sep 22, 2017 1639 09/19/17 1445   09-22-17 1200  doxycycline (VIBRA-TABS) tablet 100 mg  Status:  Discontinued     100 mg Oral Every 12 hours 09-22-17 1041 09/22/17 1116      Microbiology: Results for orders placed or performed during the hospital encounter of 09-22-17  MRSA PCR Screening     Status: None   Collection Time: 22-Sep-2017  3:02 PM  Result Value Ref Range Status   MRSA by PCR NEGATIVE NEGATIVE Final    Comment:        The GeneXpert MRSA Assay (FDA approved for NASAL specimens only), is one component of a comprehensive MRSA colonization surveillance program. It is not intended to diagnose MRSA infection nor to guide or monitor treatment for MRSA infections. Performed at [facility], [address], [place], [place] [address]    Studies: Dg Chest [facility] 1  View  Result Date: 2017-09-22 CLINICAL DATA:  Respiratory distress syndrome. EXAM: PORTABLE CHEST 1 VIEW COMPARISON:  01/08/2016. FINDINGS: Mediastinum and hilar structures normal. Lungs are clear. No pleural effusion or pneumothorax. Heart size normal. Deformity of the right clavicle again noted consist with old fracture. No acute bony abnormality. IMPRESSION: No acute cardiopulmonary disease. Electronically Signed   By: Maisie Fus  Register   On: 2017-09-22 08:51    Consults: Treatment Team:  Merwyn Katos, MD   Subjective:    Overnight Issues:patient is presently on heated high flow with improved shortness of breath. She has had some confusion yesterday although appears more lucent this morning  Objective:  Vital signs for last 24 hours: Temp:  [98.4 F (36.9 C)-99.3 F (37.4 C)] 99.1 F (37.3 C) (05/21 0200) Pulse Rate:  [81-125] 117 (05/21 0723) Resp:  [13-23] 23 (05/21 0723) BP: (94-155)/(64-115) 144/95 (05/21 0620) SpO2:  [87 %-100 %] 96 % (05/21 0723) FiO2 (%):  [35 %-40 %] 35 % (05/21 0723) Weight:  [197 lb 15.6 oz (89.8 kg)] 197 lb 15.6 oz (89.8 kg) (05/21 0500)  Hemodynamic parameters for last 24 hours:    Intake/Output from previous day: 05/20 0701 - 05/21 0700 In: 2194.4 [I.V.:2194.4] Out: 750 [Urine:750]  Intake/Output this shift: No intake/output data recorded.  Vent settings for last 24 hours: FiO2 (%):  [35 %-40 %] 35 %  Physical Exam:  Vital signs: Please see the above listed vital signs HEENT: Limited oral exam, patient is on BiPAP, accessory muscle utilization noted Cardiovascular: Tachycardia, diminished  heart sounds Pulmonary: +resp failure +wheezing Abdominal: Positive bowel sounds, soft exam, central obesity Extremities: No clubbing cyanosis or edema Cutaneous: Ecchymosis noted Neurologic: lethargic  Assessment/Plan:  62 yo morbidly obese white female with severe resp failure from severe end stage COPD exacerbation on biPAP Prognosis is very  poor, patient is DNR/DNI  Patient with end-stage COPD with acute exacerbation.arterial blood gas reveals acute on chronic hypercapnic respiratory failure Placed on albuterol, Atrovent, Levaquin, Solu-Medrol, BiPAP. Chest x-ray reveals hyperinflation without clear pneumonia noted. Patient still requiring BiPAP-very sever resp failure   1.IV steroids 2.neb therapy 3.biPAP therapy 3.morphine drip  Will transition to complete comfort care once family arrives   Critical Care Time devoted to patient care services described in this note is 32 minutes.   Overall, patient is critically ill, prognosis is guarded.  Patient with Multiorgan failure and at high risk for cardiac arrest and death.   Patient is in dying process  Lucie Leather, M.D.  [facility]  Medical Director [facility] Medical Director [facility] Cardio-Pulmonary Department

## 2017-09-20 NOTE — Progress Notes (Signed)
[employer] [facility] [place] [facility]   PATIENT NAME: Diane [name]    MR#:  [id number]  DATE OF BIRTH:  Mar 14, 1956  SUBJECTIVE:   Patient here due to severe end-stage COPD.  Now under comfort care only and on a morphine drip and on BiPAP.  She is on BiPAP awaiting for further family to arrive from out of town.  REVIEW OF SYSTEMS:    Review of Systems  Constitutional: Negative for chills and fever.  HENT: Negative for congestion and tinnitus.   Eyes: Negative for blurred vision and double vision.  Respiratory: Positive for shortness of breath. Negative for cough and wheezing.   Cardiovascular: Negative for chest pain, orthopnea and PND.  Gastrointestinal: Negative for abdominal pain, diarrhea, nausea and vomiting.  Genitourinary: Negative for dysuria and hematuria.  Neurological: Negative for dizziness, sensory change and focal weakness.  All other systems reviewed and are negative.   Nutrition: NPO Tolerating Diet: No  Tolerating PT: Await Eval.   DRUG ALLERGIES:   Allergies  Allergen Reactions  . Aspirin Swelling  . Penicillins Hives    Has patient had a PCN reaction causing immediate rash, facial/tongue/throat swelling, SOB or lightheadedness with hypotension: Yes Has patient had a PCN reaction causing severe rash involving mucus membranes or skin necrosis: No Has patient had a PCN reaction that required hospitalization: No Has patient had a PCN reaction occurring within the last 10 years: No If all of the above answers are "NO", then may proceed with Cephalosporin use.  . Levaquin [Levofloxacin] Nausea Only    VITALS:  Blood pressure (!) 139/94, pulse (!) 117, temperature 99.3 F (37.4 C), temperature source Axillary, resp. rate 13, height  (1.676 m), weight 89.8 kg (197 lb 15.6 oz), SpO2 96 %.  PHYSICAL EXAMINATION:   Physical Exam  GENERAL:  62 y.o.-year-old patient lying in bed lethargic but follows simple commands on Bipap.   EYES:  No  scleral icterus. HEENT: Head atraumatic, normocephalic. Oropharynx and nasopharynx clear.  NECK:  Supple, no jugular venous distention. No thyroid enlargement, no tenderness.  LUNGS: Good A/E b/l, no wheezing, no  rales, rhonchi. No use of accessory muscles of respiration.  CARDIOVASCULAR: S1, S2 normal. No murmurs, rubs, or gallops.  ABDOMEN: Soft, nontender, nondistended. Bowel sounds present. No organomegaly or mass.  EXTREMITIES: No cyanosis, clubbing or edema b/l.    NEUROLOGIC: Cranial nerves II through XII are intact. No focal Motor or sensory deficits b/l. Globally weak.   PSYCHIATRIC: The patient is alert and oriented x 1.  Anxious SKIN: No obvious rash, lesion, or ulcer.    LABORATORY PANEL:   CBC Recent Labs  Lab 09/19/17 0449  WBC 13.9*  HGB 11.9*  HCT 35.3  PLT 309   ------------------------------------------------------------------------------------------------------------------  Chemistries  Recent Labs  Lab 09/19/17 0449  NA 137  K 4.6  CL 98*  CO2 26  GLUCOSE 121*  BUN 26*  CREATININE 0.82  CALCIUM 8.8*  MG 2.3  AST 74*  ALT 56*  ALKPHOS 31*  BILITOT 1.2   ------------------------------------------------------------------------------------------------------------------  Cardiac Enzymes Recent Labs  Lab 09/16/17 0443  TROPONINI 2.10*   ------------------------------------------------------------------------------------------------------------------  RADIOLOGY:  No results found.   ASSESSMENT AND PLAN:   62 year old female with end-stage COPD, anxiety/depression, hypertension, hyperlipidemia, obstructive sleep apnea who presented to the hospital due to shortness of breath and noted to be in acute on chronic respiratory failure with hypoxia.  1.  Acute on chronic respiratory failure with hypoxia- secondary to severe COPD with  exacerbation.  2.  Severe end-stage COPD with exacerbation-continue treatment as mentioned above.  3.  Non-ST  elevation MI-patient has been ruled in by cardiac markers.  4.  Essential hypertension-continue Toprol, losartan.  5.  Anxiety/depression  Patient has a poor prognosis given her advanced COPD and significant dyspnea.  Palliative care consult obtained patient is now DNR with comfort care only. - Continue morphine drip, Ativan as needed, glaucoma glycopyrrolate.  Patient awaiting other family members to come from out of town before taking her off BiPAP.   All the records are reviewed and case discussed with Care Management/Social Worker. Management plans discussed with the patient, family and they are in agreement.  CODE STATUS: DNR  DVT Prophylaxis: Lovenox  TOTAL TIME TAKING CARE OF THIS PATIENT: 25 minutes.   POSSIBLE D/C unclear, DEPENDING ON CLINICAL CONDITION.   Houston Siren M.D on 09/20/2017 at 4:01 PM  Between 7am to 6pm - Pager - [phone]  After 6pm go to [email or web address] - Social research officer, government  [employer] [facility]  Office  [phone]  CC: Primary care physician; Sherlene Shams, MD

## 2017-09-20 NOTE — Progress Notes (Signed)
Chaplain spoke with the patient offering prayer and presence. Chaplain spoke with the patient's husband about the situation and how he is holding up. He replied, "I'm fine. I have had three years to get ready for this." Still, he is shaken and his emotions are visible. Patient asked Chaplain if she was getting better, Chaplain did not respond. Instead, Chaplain offered her a blessing and held her hand.

## 2017-09-20 NOTE — Progress Notes (Signed)
   09/20/17 1045  Clinical Encounter Type  Visited With Patient  Visit Type Follow-up   Based on morning report, chaplain followed up with patient.  Patient expressed desire to rest at this time; open to ongoing follow up from chaplain at another time.

## 2017-09-21 DIAGNOSIS — Z515 Encounter for palliative care: Secondary | ICD-10-CM

## 2017-09-21 MED ORDER — MORPHINE BOLUS VIA INFUSION
2.0000 mg | INTRAVENOUS | Status: DC | PRN
  Administered 2017-09-21: 2 mg via INTRAVENOUS
  Filled 2017-09-21: qty 2

## 2017-09-21 MED ORDER — CHLORHEXIDINE GLUCONATE 0.12 % MT SOLN
15.0000 mL | Freq: Two times a day (BID) | OROMUCOSAL | Status: DC
  Administered 2017-09-21 (×2): 15 mL via OROMUCOSAL
  Filled 2017-09-21: qty 15

## 2017-09-21 MED ORDER — ORAL CARE MOUTH RINSE
15.0000 mL | Freq: Two times a day (BID) | OROMUCOSAL | Status: DC
  Administered 2017-09-21: 15 mL via OROMUCOSAL

## 2017-09-21 MED ORDER — LORAZEPAM 2 MG/ML IJ SOLN: 2.0000 mg | INTRAMUSCULAR | Status: DC | PRN

## 2017-09-21 MED ORDER — LORAZEPAM 2 MG/ML IJ SOLN: 2.0000 mg | INTRAMUSCULAR | Status: DC

## 2017-09-23 ENCOUNTER — Telehealth: Payer: Self-pay | Admitting: Pulmonary Disease

## 2017-09-23 NOTE — Telephone Encounter (Signed)
DS completed death cert and has been placed at front. [place] [facility] home aware.

## 2017-09-23 NOTE — Telephone Encounter (Signed)
Recieved Death Certificate from ___[facility]_______ Placed ________in nurse box____

## 2017-09-23 NOTE — Telephone Encounter (Signed)
Death Cert given to DS in office.

## 2017-09-29 NOTE — Discharge Summary (Signed)
Pt died on 09/22/17  Brief hospital course  62 year old female with end-stage COPD, anxiety/depression, hypertension, hyperlipidemia, obstructive sleep apnea who presented to the hospital due to shortness of breath and noted to be in acute on chronic respiratory failure with hypoxia.  1.  Acute on chronic respiratory failure with hypoxia- secondary to severe COPD with exacerbation.  2.  Severe end-stage COPD with exacerbation-continue treatment as mentioned above.  3.  Non-ST elevation MI-patient has been ruled in by cardiac markers.  4.  Essential hypertension-continue Toprol, losartan.  5.  Anxiety/depression  Patient has a poor prognosis given her advanced COPD and significant dyspnea.  Palliative care consult obtained patient is now DNR with comfort care only. - Continue morphine drip, Ativan as needed, glaucoma glycopyrrolate.      Pt died in comfort care.

## 2017-10-01 NOTE — Progress Notes (Signed)
Patient made comfort care.  Pt transitioned off bipap- to 3 LNC- Pallative Nurse Kasie and family at the bedside.  Pt on morphine drip.  Patient resting comfortably.

## 2017-10-01 NOTE — Progress Notes (Signed)
CRITICAL CARE NOTE  CC  Severe resp failure  SUBJECTIVE Patient remains critically ill Prognosis is guarded Transition to comfort care      SIGNIFICANT EVENTS    BP 102/70   Pulse 85   Temp 98.4 F (36.9 C) (Axillary)   Resp 11   Ht  (1.676 m)   Wt 201 lb 11.5 oz (91.5 kg)   SpO2 100%   BMI 32.56 kg/m    REVIEW OF SYSTEMS  PATIENT IS UNABLE TO PROVIDE COMPLETE REVIEW OF SYSTEM S DUE TO SEVERE CRITICAL ILLNESS AND ENCEPHALOPATHY   PHYSICAL EXAMINATION:  GENERAL:critically ill appearing, +resp distress HEAD: Normocephalic, atraumatic.  EYES: Pupils equal, round, reactive to light.  No scleral icterus.  MOUTH: Moist mucosal membrane. NECK: Supple. No thyromegaly. No nodules. No JVD.  PULMONARY: +rhonchi, +wheezing CARDIOVASCULAR: S1 and S2. Regular rate and rhythm. No murmurs, rubs, or gallops.  GASTROINTESTINAL: Soft, nontender, -distended. No masses. Positive bowel sounds. No hepatosplenomegaly.  MUSCULOSKELETAL: No swelling, clubbing, or edema.  NEUROLOGIC: lethargic SKIN:intact,warm,dry  ASSESSMENT AND PLAN  62 yo morbidly obese white female with severe resp failure from severe end stage COPD exacerbation on biPAP Prognosis is very poor, patient is DNR/DNI  Patient still requiring BiPAP-very sever resp failure     Severe Hypoxic and Hypercapnic Respiratory Failure -continue biPAP -wean fio2  Patient is in dying process on morphine infusion  Critical Care Time devoted to patient care services described in this note is 32 minutes.   Overall, patient is critically ill, prognosis is guarded.  Patient with Multiorgan failure and at high risk for cardiac arrest and death.    Lucie Leather, M.D.  [facility]  Medical Director [facility] Medical Director [facility] Cardio-Pulmonary Department

## 2017-10-01 NOTE — Progress Notes (Signed)
Around 2218-09-16 pt family called out for a nurse. This Clinical research associate arrived at pt room with family surrounding her bed crying. They said they believed that she was gone. This Clinical research associate assessed pt for signs of life (Pt was a transfer from CCU on comfort measures only, nurse may pronounce). This Clinical research associate called nurse Kelton Pillar to be the second verifier. Pt possessed no signs of life- no pulse, heartbeat or respiratory status. Time of death Sep 16, 2223 . Family given comfort. Chaplain notified. MD notified as well as Quay Burow. [employer] notified as well. This Clinical research associate allowed family to grieve and instructed them to take as long as they wanted. Post modem care will be performed once pt family leave. Will continue to address the needs of the family.

## 2017-10-01 NOTE — Progress Notes (Signed)
   09/25/2017 2235  Clinical Encounter Type  Visited With Family  Visit Type Death  Referral From Nurse  Consult/Referral To Chaplain  Spiritual Encounters  Spiritual Needs Prayer;Grief support   CH received PG that PT had died. CH reported to PT's RM and offered grief support and prayer for the family.

## 2017-10-01 NOTE — Progress Notes (Signed)
Patient moved to room 128 by bed with this RN and orderly.  Son traveled with patient to new room and is at side of bed.  Snoring occasionally with shallow respirations.  No display of pain.

## 2017-10-01 NOTE — Progress Notes (Signed)
Daily Progress Note   Patient Name: Diane [name]       Date: 09/29/2017 DOB: Jan 14, 1956  Age: 62 y.o. MRN#: [id number] Attending Physician: Altamese Dilling, * Primary Care Physician: Sherlene Shams, MD Admit Date: October 12, 2017  Reason for Consultation/Follow-up: Terminal Care  Subjective: 1000-Visited patient and family this morning. Patient was calling out for her "mother" last night. This morning she asks me if she looks ok. Daughter, [name] is at bedside. Other family members have gone to lunch. Plan is for bipap withdrawal this afternoon.  1430- Per RN patient's family state they and patient are ready for withdrawal of Bipap. Patient remained comfortable throughout transition from bipap to nasal cannula with provision of morphine and lorazepam. Patient does not wake to voice. Her breathing is not labored. Emotional and spiritual support was provided to patient's children and to sister in law.   Review of Systems  Unable to perform ROS: Acuity of condition    Length of Stay: 6  Current Medications: Scheduled Meds:  . chlorhexidine  15 mL Mouth Rinse BID  . LORazepam  2 mg Intravenous Q6H  . mouth rinse  15 mL Mouth Rinse q12n4p    Continuous Infusions: . morphine 3 mg/hr (09/20/2017 1025)    PRN Meds: glycopyrrolate **OR** glycopyrrolate **OR** glycopyrrolate, haloperidol lactate, LORazepam, morphine, [DISCONTINUED] ondansetron **OR** ondansetron (ZOFRAN) IV  Physical Exam  Constitutional: She appears well-developed and well-nourished.  Pulmonary/Chest: She has wheezes.  Greatly diminished  Skin:  Hands and feet purple  Nursing note and vitals reviewed.           Vital Signs: BP 90/65   Pulse 93   Temp 98.1 F (36.7 C) (Axillary)   Resp (!) 9   Ht  (1.676  m)   Wt 91.5 kg (201 lb 11.5 oz)   SpO2 99%   BMI 32.56 kg/m  SpO2: SpO2: 99 % O2 Device: O2 Device: Bi-PAP O2 Flow Rate: O2 Flow Rate (L/min): 50 L/min  Intake/output summary:   Intake/Output Summary (Last 24 hours) at 09/14/2017 1546 Last data filed at 09/29/2017 1214 Gross per 24 hour  Intake 3 ml  Output 100 ml  Net -97 ml   LBM: Last BM Date: (UTA) Baseline Weight: Weight: 86.2 kg (190 lb) Most recent weight: Weight: 91.5 kg (201 lb 11.5 oz)  Palliative Assessment/Data: PPS: 10%     Patient Active Problem List   Diagnosis Date Noted  . End stage COPD (HCC)   . NSTEMI (non-ST elevated myocardial infarction) (HCC)   . Palliative care by specialist   . Acute on chronic respiratory failure with hypoxia and hypercapnia (HCC)   . Advanced care planning/counseling discussion   . Goals of care, counseling/discussion   . Impaired fasting glucose 11/20/2016  . COPD exacerbation (HCC) 09/27/2015  . COPD (chronic obstructive pulmonary disease) (HCC) 12/25/2014  . Cough 07/23/2014  . Chronic hypoxemic respiratory failure (HCC) 05/08/2014  . Tobacco abuse counseling 12/29/2013  . Increased urinary frequency 03/22/2013  . Cardiac murmur 01/30/2013  . GERD (gastroesophageal reflux disease) 01/15/2013  . Hypertension 08/15/2011  . Annual physical exam 08/15/2011  . GOLD GRADE D COPD   . Hyperlipidemia   . History of tobacco abuse   . Depression   . Anxiety     Palliative Care Assessment & Plan   Patient Profile: 62 y.o. female  with past medical history of end stage COPD (on home O2), recurrent pneumonia, anxiety, depression, GERD, HTN admitted on 08/31/2017 with respiratory distress at home. Workup reveals COPD exacerbation and NSTEMI. She has required Bipap during admission. She was DNR status on admission. Per pulmonology note she is very well known to them outpatient and "she has very severe COPD and debilitation at baseline". During this admission she has continued  to be distressed, continues on Bipap without weaning, requiring morphine and sedation with precedex. Palliative medicine is consulted for GOC and discussion of possible EOL and comfort care.   Assessment/Recommendations/Plan   Changed morphine bolus to PRN  Noted lorazepam was held last night- discussed with RN- please do not hold scheduled lorazepam  Continue full comfort care  Goals of Care and Additional Recommendations:  Limitations on Scope of Treatment: Full Comfort Care  Code Status:  DNR  Prognosis:   Hours - Days  Discharge Planning:  Anticipated Hospital Death  Care plan was discussed with patient and children.   Thank you for allowing the Palliative Medicine Team to assist in the care of this patient.   Time In: 1000 1400 Time Out: 1030 1600 Total Time 150 mins Prolonged Time Billed Yes      Greater than 50%  of this time was spent counseling and coordinating care related to the above assessment and plan.  Ocie Bob, AGNP-C Palliative Medicine   Please contact Palliative Medicine Team phone at [phone] for questions and concerns.

## 2017-10-01 NOTE — Progress Notes (Signed)
Report called to [place], RN on 1C.  Patient will be moved to room 128.  Family aware of patient being transferred.

## 2017-10-01 NOTE — Progress Notes (Signed)
[facility] -  at [facility]   PATIENT NAME: Diane [name]    MR#:  [id number]  DATE OF BIRTH:  [birth year]  SUBJECTIVE:   Patient here due to severe end-stage COPD.  Now under comfort care only and on a morphine drip .  REVIEW OF SYSTEMS:    Review of Systems  Unable to perform ROS: Mental acuity  All other systems reviewed and are negative. Pt is on comfort care with morphine.  Nutrition: NPO Tolerating Diet: No  Tolerating PT: Await Eval.   DRUG ALLERGIES:   Allergies  Allergen Reactions  . Aspirin Swelling  . Penicillins Hives    Has patient had a PCN reaction causing immediate rash, facial/tongue/throat swelling, SOB or lightheadedness with hypotension: Yes Has patient had a PCN reaction causing severe rash involving mucus membranes or skin necrosis: No Has patient had a PCN reaction that required hospitalization: No Has patient had a PCN reaction occurring within the last 10 years: No If all of the above answers are "NO", then may proceed with Cephalosporin use.  . Levaquin [Levofloxacin] Nausea Only    VITALS:  Blood pressure 90/65, pulse 93, temperature 98.1 F (36.7 C), temperature source Axillary, resp. rate (!) 9, height  (1.676 m), weight 91.5 kg (201 lb 11.5 oz), SpO2 99 %.  PHYSICAL EXAMINATION:   Physical Exam  GENERAL:  62 y.o.-year-old patient lying in bed lethargic but follows simple commands on Bipap.   EYES:  No scleral icterus. HEENT: Head atraumatic, normocephalic. Oropharynx and nasopharynx clear.  NECK:  Supple, no jugular venous distention. No thyroid enlargement, no tenderness.  LUNGS:  no wheezing, no  rales, rhonchi. No use of accessory muscles of respiration.  Patient has shallow breathing. CARDIOVASCULAR: S1, S2 normal. No murmurs, rubs, or gallops.  ABDOMEN: Soft, nontender, nondistended. Bowel sounds present. No organomegaly or mass.  EXTREMITIES: No cyanosis, clubbing or edema b/l.    NEUROLOGIC: drowsy,  Globally weak.   PSYCHIATRIC: The patient is and drowsy with morphine drip. SKIN: No obvious rash, lesion, or ulcer.    LABORATORY PANEL:   CBC Recent Labs  Lab 09/19/17 0449  WBC 13.9*  HGB 11.9*  HCT 35.3  PLT 309   ------------------------------------------------------------------------------------------------------------------  Chemistries  Recent Labs  Lab 09/19/17 0449  NA 137  K 4.6  CL 98*  CO2 26  GLUCOSE 121*  BUN 26*  CREATININE 0.82  CALCIUM 8.8*  MG 2.3  AST 74*  ALT 56*  ALKPHOS 31*  BILITOT 1.2   ------------------------------------------------------------------------------------------------------------------  Cardiac Enzymes Recent Labs  Lab 09/16/17 0443  TROPONINI 2.10*   ------------------------------------------------------------------------------------------------------------------  RADIOLOGY:  No results found.   ASSESSMENT AND PLAN:   62 year old female with end-stage COPD, anxiety/depression, hypertension, hyperlipidemia, obstructive sleep apnea who presented to the hospital due to shortness of breath and noted to be in acute on chronic respiratory failure with hypoxia.  1.  Acute on chronic respiratory failure with hypoxia- secondary to severe COPD with exacerbation.  2.  Severe end-stage COPD with exacerbation-continue treatment as mentioned above.  3.  Non-ST elevation MI-patient has been ruled in by cardiac markers.  4.  Essential hypertension-continue Toprol, losartan.  5.  Anxiety/depression  Patient has a poor prognosis given her advanced COPD and significant dyspnea.  Palliative care consult obtained patient is now DNR with comfort care only. - Continue morphine drip, Ativan as needed, glaucoma glycopyrrolate.     All the records are reviewed and case discussed with Care Management/Social Worker. Management plans discussed  with the patient, family and they are in agreement.  CODE STATUS: DNR  DVT Prophylaxis:  Lovenox  TOTAL TIME TAKING CARE OF THIS PATIENT: 20 minutes.   POSSIBLE D/C unclear, DEPENDING ON CLINICAL CONDITION.   Altamese Dilling M.D on 09/03/2017 at 4:38 PM  Between 7am to 6pm - Pager - [phone]  After 6pm go to [email or web address] - Social research officer, government  [employer] [facility]  Office  [phone]  CC: Primary care physician; Sherlene Shams, MD

## 2017-10-01 DEATH — deceased

## 2017-10-25 ENCOUNTER — Ambulatory Visit: Payer: Medicare HMO | Admitting: Pulmonary Disease

## 2018-09-15 NOTE — Telephone Encounter (Signed)
error
